# Patient Record
Sex: Female | Born: 1960
Health system: Southern US, Community
[De-identification: ages and names within clinical notes are randomized; demographics above are authoritative.]

## PROBLEM LIST (undated history)

## (undated) DIAGNOSIS — T7840XA Allergy, unspecified, initial encounter: Secondary | ICD-10-CM

## (undated) DIAGNOSIS — F89 Unspecified disorder of psychological development: Secondary | ICD-10-CM

## (undated) DIAGNOSIS — E669 Obesity, unspecified: Secondary | ICD-10-CM

## (undated) DIAGNOSIS — H269 Unspecified cataract: Secondary | ICD-10-CM

## (undated) DIAGNOSIS — G44229 Chronic tension-type headache, not intractable: Secondary | ICD-10-CM

## (undated) DIAGNOSIS — G43909 Migraine, unspecified, not intractable, without status migrainosus: Secondary | ICD-10-CM

## (undated) DIAGNOSIS — K219 Gastro-esophageal reflux disease without esophagitis: Secondary | ICD-10-CM

## (undated) DIAGNOSIS — E785 Hyperlipidemia, unspecified: Secondary | ICD-10-CM

## (undated) DIAGNOSIS — K5909 Other constipation: Secondary | ICD-10-CM

## (undated) DIAGNOSIS — F329 Major depressive disorder, single episode, unspecified: Secondary | ICD-10-CM

## (undated) DIAGNOSIS — F32A Depression, unspecified: Secondary | ICD-10-CM

## (undated) DIAGNOSIS — F79 Unspecified intellectual disabilities: Secondary | ICD-10-CM

## (undated) DIAGNOSIS — D369 Benign neoplasm, unspecified site: Secondary | ICD-10-CM

## (undated) DIAGNOSIS — R3129 Other microscopic hematuria: Secondary | ICD-10-CM

## (undated) DIAGNOSIS — N183 Chronic kidney disease, stage 3 unspecified: Secondary | ICD-10-CM

## (undated) DIAGNOSIS — I10 Essential (primary) hypertension: Secondary | ICD-10-CM

## (undated) DIAGNOSIS — F6810 Factitious disorder, unspecified: Secondary | ICD-10-CM

## (undated) DIAGNOSIS — F419 Anxiety disorder, unspecified: Secondary | ICD-10-CM

## (undated) HISTORY — DX: Hyperlipidemia, unspecified: E78.5

## (undated) HISTORY — DX: Chronic tension-type headache, not intractable: G44.229

## (undated) HISTORY — DX: Major depressive disorder, single episode, unspecified: F32.9

## (undated) HISTORY — DX: Chronic kidney disease, stage 3 (moderate): N18.3

## (undated) HISTORY — DX: Anxiety disorder, unspecified: F41.9

## (undated) HISTORY — DX: Depression, unspecified: F32.A

## (undated) HISTORY — DX: Gastro-esophageal reflux disease without esophagitis: K21.9

## (undated) HISTORY — DX: Unspecified intellectual disabilities: F79

## (undated) HISTORY — DX: Essential (primary) hypertension: I10

## (undated) HISTORY — DX: Other microscopic hematuria: R31.29

## (undated) HISTORY — DX: Allergy, unspecified, initial encounter: T78.40XA

## (undated) HISTORY — DX: Unspecified disorder of psychological development: F89

## (undated) HISTORY — PX: CARPAL TUNNEL RELEASE: SHX101

## (undated) HISTORY — DX: Migraine, unspecified, not intractable, without status migrainosus: G43.909

## (undated) HISTORY — DX: Chronic kidney disease, stage 3 unspecified: N18.30

## (undated) HISTORY — DX: Unspecified cataract: H26.9

## (undated) HISTORY — DX: Other constipation: K59.09

## (undated) HISTORY — DX: Factitious disorder imposed on self, unspecified: F68.10

## (undated) HISTORY — DX: Obesity, unspecified: E66.9

---

## 2004-11-02 ENCOUNTER — Emergency Department: Payer: Self-pay | Admitting: Emergency Medicine

## 2005-02-15 ENCOUNTER — Emergency Department: Payer: Self-pay | Admitting: Emergency Medicine

## 2005-07-06 ENCOUNTER — Emergency Department: Payer: Self-pay | Admitting: Emergency Medicine

## 2005-08-10 ENCOUNTER — Emergency Department: Payer: Self-pay | Admitting: Emergency Medicine

## 2005-08-18 ENCOUNTER — Emergency Department: Payer: Self-pay | Admitting: Emergency Medicine

## 2005-11-01 ENCOUNTER — Emergency Department: Payer: Self-pay | Admitting: Emergency Medicine

## 2006-03-28 ENCOUNTER — Emergency Department: Payer: Self-pay | Admitting: Emergency Medicine

## 2006-12-19 ENCOUNTER — Ambulatory Visit: Payer: Self-pay | Admitting: *Deleted

## 2007-02-23 ENCOUNTER — Emergency Department: Payer: Self-pay | Admitting: Emergency Medicine

## 2007-03-27 ENCOUNTER — Emergency Department: Payer: Self-pay | Admitting: Emergency Medicine

## 2007-05-04 ENCOUNTER — Emergency Department: Payer: Self-pay | Admitting: Emergency Medicine

## 2007-05-05 ENCOUNTER — Emergency Department: Payer: Self-pay | Admitting: Emergency Medicine

## 2007-05-13 ENCOUNTER — Emergency Department: Payer: Self-pay | Admitting: Emergency Medicine

## 2007-05-13 ENCOUNTER — Other Ambulatory Visit: Payer: Self-pay

## 2007-07-02 ENCOUNTER — Emergency Department: Payer: Self-pay | Admitting: Emergency Medicine

## 2007-07-16 ENCOUNTER — Emergency Department: Payer: Self-pay | Admitting: Emergency Medicine

## 2007-07-20 ENCOUNTER — Emergency Department: Payer: Self-pay | Admitting: Emergency Medicine

## 2007-07-23 ENCOUNTER — Emergency Department: Payer: Self-pay | Admitting: Emergency Medicine

## 2007-08-05 ENCOUNTER — Emergency Department: Payer: Self-pay | Admitting: Emergency Medicine

## 2007-08-05 ENCOUNTER — Other Ambulatory Visit: Payer: Self-pay

## 2007-10-15 ENCOUNTER — Inpatient Hospital Stay: Payer: Self-pay | Admitting: Internal Medicine

## 2007-10-15 ENCOUNTER — Other Ambulatory Visit: Payer: Self-pay

## 2007-10-19 ENCOUNTER — Emergency Department: Payer: Self-pay | Admitting: Emergency Medicine

## 2008-01-14 ENCOUNTER — Emergency Department: Payer: Self-pay | Admitting: Emergency Medicine

## 2008-01-28 ENCOUNTER — Emergency Department: Payer: Self-pay | Admitting: Emergency Medicine

## 2008-01-28 ENCOUNTER — Other Ambulatory Visit: Payer: Self-pay

## 2008-04-29 ENCOUNTER — Inpatient Hospital Stay: Payer: Self-pay | Admitting: Unknown Physician Specialty

## 2008-06-21 ENCOUNTER — Emergency Department: Payer: Self-pay | Admitting: Emergency Medicine

## 2010-06-01 ENCOUNTER — Emergency Department: Payer: Self-pay

## 2010-12-02 HISTORY — PX: US ECHOCARDIOGRAPHY: HXRAD669

## 2011-02-11 ENCOUNTER — Inpatient Hospital Stay: Payer: Self-pay | Admitting: Internal Medicine

## 2011-07-11 ENCOUNTER — Emergency Department: Payer: Self-pay | Admitting: Emergency Medicine

## 2011-09-06 ENCOUNTER — Emergency Department: Payer: Self-pay | Admitting: Emergency Medicine

## 2011-12-13 DIAGNOSIS — G43919 Migraine, unspecified, intractable, without status migrainosus: Secondary | ICD-10-CM | POA: Diagnosis not present

## 2011-12-13 DIAGNOSIS — Z0000 Encounter for general adult medical examination without abnormal findings: Secondary | ICD-10-CM | POA: Diagnosis not present

## 2011-12-13 DIAGNOSIS — E785 Hyperlipidemia, unspecified: Secondary | ICD-10-CM | POA: Diagnosis not present

## 2011-12-13 DIAGNOSIS — F329 Major depressive disorder, single episode, unspecified: Secondary | ICD-10-CM | POA: Diagnosis not present

## 2011-12-13 DIAGNOSIS — Z1331 Encounter for screening for depression: Secondary | ICD-10-CM | POA: Diagnosis not present

## 2012-02-04 ENCOUNTER — Ambulatory Visit: Payer: Self-pay

## 2012-02-04 DIAGNOSIS — Z1231 Encounter for screening mammogram for malignant neoplasm of breast: Secondary | ICD-10-CM | POA: Diagnosis not present

## 2012-02-07 DIAGNOSIS — IMO0002 Reserved for concepts with insufficient information to code with codable children: Secondary | ICD-10-CM | POA: Diagnosis not present

## 2012-02-12 DIAGNOSIS — F331 Major depressive disorder, recurrent, moderate: Secondary | ICD-10-CM | POA: Diagnosis not present

## 2012-02-12 DIAGNOSIS — F411 Generalized anxiety disorder: Secondary | ICD-10-CM | POA: Diagnosis not present

## 2012-02-12 DIAGNOSIS — F79 Unspecified intellectual disabilities: Secondary | ICD-10-CM | POA: Diagnosis not present

## 2012-02-17 ENCOUNTER — Ambulatory Visit: Payer: Self-pay | Admitting: Unknown Physician Specialty

## 2012-02-17 DIAGNOSIS — Z79899 Other long term (current) drug therapy: Secondary | ICD-10-CM | POA: Diagnosis not present

## 2012-02-17 DIAGNOSIS — K5900 Constipation, unspecified: Secondary | ICD-10-CM | POA: Diagnosis not present

## 2012-02-17 DIAGNOSIS — D126 Benign neoplasm of colon, unspecified: Secondary | ICD-10-CM | POA: Diagnosis not present

## 2012-02-17 DIAGNOSIS — D128 Benign neoplasm of rectum: Secondary | ICD-10-CM | POA: Diagnosis not present

## 2012-02-17 DIAGNOSIS — F70 Mild intellectual disabilities: Secondary | ICD-10-CM | POA: Diagnosis not present

## 2012-02-17 DIAGNOSIS — D129 Benign neoplasm of anus and anal canal: Secondary | ICD-10-CM | POA: Diagnosis not present

## 2012-02-17 DIAGNOSIS — Z9889 Other specified postprocedural states: Secondary | ICD-10-CM | POA: Diagnosis not present

## 2012-02-17 DIAGNOSIS — Z8371 Family history of colonic polyps: Secondary | ICD-10-CM | POA: Diagnosis not present

## 2012-02-17 DIAGNOSIS — E669 Obesity, unspecified: Secondary | ICD-10-CM | POA: Diagnosis not present

## 2012-02-17 DIAGNOSIS — Z836 Family history of other diseases of the respiratory system: Secondary | ICD-10-CM | POA: Diagnosis not present

## 2012-02-17 DIAGNOSIS — F329 Major depressive disorder, single episode, unspecified: Secondary | ICD-10-CM | POA: Diagnosis not present

## 2012-02-17 DIAGNOSIS — Z86010 Personal history of colonic polyps: Secondary | ICD-10-CM | POA: Diagnosis not present

## 2012-02-17 DIAGNOSIS — I10 Essential (primary) hypertension: Secondary | ICD-10-CM | POA: Diagnosis not present

## 2012-02-17 DIAGNOSIS — G43909 Migraine, unspecified, not intractable, without status migrainosus: Secondary | ICD-10-CM | POA: Diagnosis not present

## 2012-02-17 DIAGNOSIS — Z808 Family history of malignant neoplasm of other organs or systems: Secondary | ICD-10-CM | POA: Diagnosis not present

## 2012-02-17 DIAGNOSIS — E785 Hyperlipidemia, unspecified: Secondary | ICD-10-CM | POA: Diagnosis not present

## 2012-02-17 DIAGNOSIS — K648 Other hemorrhoids: Secondary | ICD-10-CM | POA: Diagnosis not present

## 2012-02-19 LAB — PATHOLOGY REPORT

## 2012-02-20 ENCOUNTER — Emergency Department: Payer: Self-pay | Admitting: Emergency Medicine

## 2012-02-20 DIAGNOSIS — T562X4A Toxic effect of chromium and its compounds, undetermined, initial encounter: Secondary | ICD-10-CM | POA: Diagnosis not present

## 2012-02-20 DIAGNOSIS — F79 Unspecified intellectual disabilities: Secondary | ICD-10-CM | POA: Diagnosis not present

## 2012-02-20 DIAGNOSIS — R6889 Other general symptoms and signs: Secondary | ICD-10-CM | POA: Diagnosis not present

## 2012-02-20 DIAGNOSIS — I10 Essential (primary) hypertension: Secondary | ICD-10-CM | POA: Diagnosis not present

## 2012-02-20 DIAGNOSIS — T50901A Poisoning by unspecified drugs, medicaments and biological substances, accidental (unintentional), initial encounter: Secondary | ICD-10-CM | POA: Diagnosis not present

## 2012-02-20 DIAGNOSIS — N189 Chronic kidney disease, unspecified: Secondary | ICD-10-CM | POA: Diagnosis not present

## 2012-02-20 LAB — URINALYSIS, COMPLETE
Blood: NEGATIVE
Nitrite: NEGATIVE
Protein: NEGATIVE
RBC,UR: 5 /HPF (ref 0–5)
Specific Gravity: 1.02 (ref 1.003–1.030)
WBC UR: 5 /HPF (ref 0–5)

## 2012-02-20 LAB — COMPREHENSIVE METABOLIC PANEL
Albumin: 4 g/dL (ref 3.4–5.0)
Anion Gap: 12 (ref 7–16)
BUN: 16 mg/dL (ref 7–18)
Bilirubin,Total: 0.6 mg/dL (ref 0.2–1.0)
Chloride: 107 mmol/L (ref 98–107)
Co2: 20 mmol/L — ABNORMAL LOW (ref 21–32)
Creatinine: 1.42 mg/dL — ABNORMAL HIGH (ref 0.60–1.30)
EGFR (African American): 50 — ABNORMAL LOW
Glucose: 104 mg/dL — ABNORMAL HIGH (ref 65–99)
Osmolality: 279 (ref 275–301)
Potassium: 3.3 mmol/L — ABNORMAL LOW (ref 3.5–5.1)
SGOT(AST): 34 U/L (ref 15–37)
Total Protein: 8.8 g/dL — ABNORMAL HIGH (ref 6.4–8.2)

## 2012-02-20 LAB — TSH: Thyroid Stimulating Horm: 1.94 u[IU]/mL

## 2012-02-20 LAB — DRUG SCREEN, URINE
Amphetamines, Ur Screen: NEGATIVE (ref ?–1000)
Benzodiazepine, Ur Scrn: NEGATIVE (ref ?–200)
Cannabinoid 50 Ng, Ur ~~LOC~~: NEGATIVE (ref ?–50)
MDMA (Ecstasy)Ur Screen: NEGATIVE (ref ?–500)
Opiate, Ur Screen: NEGATIVE (ref ?–300)
Tricyclic, Ur Screen: NEGATIVE (ref ?–1000)

## 2012-02-20 LAB — CBC
HCT: 45.9 % (ref 35.0–47.0)
RBC: 5.12 10*6/uL (ref 3.80–5.20)
RDW: 13.1 % (ref 11.5–14.5)

## 2012-02-20 LAB — ETHANOL: Ethanol %: <0.003 % (ref 0.000–0.080)

## 2012-02-20 LAB — SALICYLATE LEVEL: Salicylates, Serum: 2.3 mg/dL

## 2012-02-26 ENCOUNTER — Inpatient Hospital Stay: Payer: Self-pay | Admitting: Psychiatry

## 2012-02-26 DIAGNOSIS — F329 Major depressive disorder, single episode, unspecified: Secondary | ICD-10-CM | POA: Diagnosis not present

## 2012-02-26 DIAGNOSIS — F79 Unspecified intellectual disabilities: Secondary | ICD-10-CM | POA: Diagnosis not present

## 2012-02-26 DIAGNOSIS — M1990 Unspecified osteoarthritis, unspecified site: Secondary | ICD-10-CM | POA: Diagnosis present

## 2012-02-26 DIAGNOSIS — F3289 Other specified depressive episodes: Secondary | ICD-10-CM | POA: Diagnosis not present

## 2012-02-26 DIAGNOSIS — F259 Schizoaffective disorder, unspecified: Secondary | ICD-10-CM | POA: Diagnosis not present

## 2012-02-26 DIAGNOSIS — F331 Major depressive disorder, recurrent, moderate: Secondary | ICD-10-CM | POA: Diagnosis not present

## 2012-02-26 DIAGNOSIS — R45851 Suicidal ideations: Secondary | ICD-10-CM | POA: Diagnosis not present

## 2012-02-26 DIAGNOSIS — F333 Major depressive disorder, recurrent, severe with psychotic symptoms: Secondary | ICD-10-CM | POA: Diagnosis not present

## 2012-02-26 DIAGNOSIS — F411 Generalized anxiety disorder: Secondary | ICD-10-CM | POA: Diagnosis not present

## 2012-02-26 DIAGNOSIS — E7800 Pure hypercholesterolemia, unspecified: Secondary | ICD-10-CM | POA: Diagnosis not present

## 2012-02-26 LAB — URINALYSIS, COMPLETE
Blood: NEGATIVE
Glucose,UR: NEGATIVE mg/dL (ref 0–75)
Ketone: NEGATIVE
Nitrite: NEGATIVE
Squamous Epithelial: 1

## 2012-02-26 LAB — DRUG SCREEN, URINE
Amphetamines, Ur Screen: NEGATIVE (ref ?–1000)
Barbiturates, Ur Screen: NEGATIVE (ref ?–200)
Benzodiazepine, Ur Scrn: NEGATIVE (ref ?–200)
Cannabinoid 50 Ng, Ur ~~LOC~~: NEGATIVE (ref ?–50)
Cocaine Metabolite,Ur ~~LOC~~: NEGATIVE (ref ?–300)
MDMA (Ecstasy)Ur Screen: NEGATIVE (ref ?–500)
Phencyclidine (PCP) Ur S: NEGATIVE (ref ?–25)
Tricyclic, Ur Screen: NEGATIVE (ref ?–1000)

## 2012-02-26 LAB — SALICYLATE LEVEL: Salicylates, Serum: <1.7 mg/dL

## 2012-02-26 LAB — COMPREHENSIVE METABOLIC PANEL
Albumin: 3.7 g/dL (ref 3.4–5.0)
Alkaline Phosphatase: 110 U/L (ref 50–136)
Bilirubin,Total: 0.3 mg/dL (ref 0.2–1.0)
Calcium, Total: 9.4 mg/dL (ref 8.5–10.1)
Osmolality: 284 (ref 275–301)
SGOT(AST): 17 U/L (ref 15–37)

## 2012-02-26 LAB — CBC
HCT: 42.3 % (ref 35.0–47.0)
HGB: 14 g/dL (ref 12.0–16.0)
MCH: 30 pg (ref 26.0–34.0)
WBC: 8.8 10*3/uL (ref 3.6–11.0)

## 2012-02-26 LAB — ETHANOL: Ethanol %: <0.003 % (ref 0.000–0.080)

## 2012-02-27 LAB — BEHAVIORAL MEDICINE 1 PANEL
Albumin: 3.3 g/dL — ABNORMAL LOW (ref 3.4–5.0)
Anion Gap: 9 (ref 7–16)
Basophil #: 0 10*3/uL (ref 0.0–0.1)
Basophil %: 0.5 %
Bilirubin,Total: 0.4 mg/dL (ref 0.2–1.0)
Calcium, Total: 9.1 mg/dL (ref 8.5–10.1)
Co2: 26 mmol/L (ref 21–32)
Creatinine: 1.36 mg/dL — ABNORMAL HIGH (ref 0.60–1.30)
EGFR (African American): 53 — ABNORMAL LOW
Eosinophil #: 0.3 10*3/uL (ref 0.0–0.7)
Glucose: 87 mg/dL (ref 65–99)
HCT: 39.7 % (ref 35.0–47.0)
HGB: 13.1 g/dL (ref 12.0–16.0)
Lymphocyte %: 31.9 %
MCV: 90 fL (ref 80–100)
Monocyte #: 0.5 10*3/uL (ref 0.0–0.7)
Monocyte %: 6.5 %
Potassium: 3.9 mmol/L (ref 3.5–5.1)
RBC: 4.42 10*6/uL (ref 3.80–5.20)
RDW: 13.1 % (ref 11.5–14.5)
Sodium: 140 mmol/L (ref 136–145)
Thyroid Stimulating Horm: 1.86 u[IU]/mL
Total Protein: 7.2 g/dL (ref 6.4–8.2)

## 2012-03-16 DIAGNOSIS — F331 Major depressive disorder, recurrent, moderate: Secondary | ICD-10-CM | POA: Diagnosis not present

## 2012-03-16 DIAGNOSIS — F79 Unspecified intellectual disabilities: Secondary | ICD-10-CM | POA: Diagnosis not present

## 2012-04-08 DIAGNOSIS — F79 Unspecified intellectual disabilities: Secondary | ICD-10-CM | POA: Diagnosis not present

## 2012-04-10 DIAGNOSIS — R5383 Other fatigue: Secondary | ICD-10-CM | POA: Diagnosis not present

## 2012-04-10 DIAGNOSIS — R6889 Other general symptoms and signs: Secondary | ICD-10-CM | POA: Diagnosis not present

## 2012-04-10 DIAGNOSIS — G43919 Migraine, unspecified, intractable, without status migrainosus: Secondary | ICD-10-CM | POA: Diagnosis not present

## 2012-04-10 DIAGNOSIS — N390 Urinary tract infection, site not specified: Secondary | ICD-10-CM | POA: Diagnosis not present

## 2012-04-10 DIAGNOSIS — R5381 Other malaise: Secondary | ICD-10-CM | POA: Diagnosis not present

## 2012-04-14 DIAGNOSIS — F411 Generalized anxiety disorder: Secondary | ICD-10-CM | POA: Diagnosis not present

## 2012-05-01 DIAGNOSIS — F411 Generalized anxiety disorder: Secondary | ICD-10-CM | POA: Diagnosis not present

## 2012-05-01 DIAGNOSIS — F259 Schizoaffective disorder, unspecified: Secondary | ICD-10-CM | POA: Diagnosis not present

## 2012-05-01 DIAGNOSIS — F79 Unspecified intellectual disabilities: Secondary | ICD-10-CM | POA: Diagnosis not present

## 2012-05-11 DIAGNOSIS — F329 Major depressive disorder, single episode, unspecified: Secondary | ICD-10-CM | POA: Diagnosis not present

## 2012-05-11 DIAGNOSIS — G43919 Migraine, unspecified, intractable, without status migrainosus: Secondary | ICD-10-CM | POA: Diagnosis not present

## 2012-05-11 DIAGNOSIS — N183 Chronic kidney disease, stage 3 unspecified: Secondary | ICD-10-CM | POA: Diagnosis not present

## 2012-05-11 DIAGNOSIS — IMO0002 Reserved for concepts with insufficient information to code with codable children: Secondary | ICD-10-CM | POA: Diagnosis not present

## 2012-05-11 DIAGNOSIS — E7800 Pure hypercholesterolemia, unspecified: Secondary | ICD-10-CM | POA: Diagnosis not present

## 2012-05-15 DIAGNOSIS — F411 Generalized anxiety disorder: Secondary | ICD-10-CM | POA: Diagnosis not present

## 2012-05-15 DIAGNOSIS — F79 Unspecified intellectual disabilities: Secondary | ICD-10-CM | POA: Diagnosis not present

## 2012-05-15 DIAGNOSIS — F259 Schizoaffective disorder, unspecified: Secondary | ICD-10-CM | POA: Diagnosis not present

## 2012-06-01 DIAGNOSIS — F259 Schizoaffective disorder, unspecified: Secondary | ICD-10-CM | POA: Diagnosis not present

## 2012-06-01 DIAGNOSIS — F411 Generalized anxiety disorder: Secondary | ICD-10-CM | POA: Diagnosis not present

## 2012-06-01 DIAGNOSIS — F79 Unspecified intellectual disabilities: Secondary | ICD-10-CM | POA: Diagnosis not present

## 2012-06-12 DIAGNOSIS — F411 Generalized anxiety disorder: Secondary | ICD-10-CM | POA: Diagnosis not present

## 2012-06-12 DIAGNOSIS — F259 Schizoaffective disorder, unspecified: Secondary | ICD-10-CM | POA: Diagnosis not present

## 2012-06-12 DIAGNOSIS — F79 Unspecified intellectual disabilities: Secondary | ICD-10-CM | POA: Diagnosis not present

## 2012-07-17 DIAGNOSIS — F259 Schizoaffective disorder, unspecified: Secondary | ICD-10-CM | POA: Diagnosis not present

## 2012-07-17 DIAGNOSIS — F411 Generalized anxiety disorder: Secondary | ICD-10-CM | POA: Diagnosis not present

## 2012-07-17 DIAGNOSIS — F79 Unspecified intellectual disabilities: Secondary | ICD-10-CM | POA: Diagnosis not present

## 2012-07-27 DIAGNOSIS — F79 Unspecified intellectual disabilities: Secondary | ICD-10-CM | POA: Diagnosis not present

## 2012-07-27 DIAGNOSIS — F411 Generalized anxiety disorder: Secondary | ICD-10-CM | POA: Diagnosis not present

## 2012-08-10 DIAGNOSIS — F329 Major depressive disorder, single episode, unspecified: Secondary | ICD-10-CM | POA: Diagnosis not present

## 2012-08-10 DIAGNOSIS — F259 Schizoaffective disorder, unspecified: Secondary | ICD-10-CM | POA: Diagnosis not present

## 2012-08-10 DIAGNOSIS — G44229 Chronic tension-type headache, not intractable: Secondary | ICD-10-CM | POA: Diagnosis not present

## 2012-08-10 DIAGNOSIS — F411 Generalized anxiety disorder: Secondary | ICD-10-CM | POA: Diagnosis not present

## 2012-08-10 DIAGNOSIS — F79 Unspecified intellectual disabilities: Secondary | ICD-10-CM | POA: Diagnosis not present

## 2012-08-13 DIAGNOSIS — G43909 Migraine, unspecified, not intractable, without status migrainosus: Secondary | ICD-10-CM | POA: Diagnosis not present

## 2012-08-13 DIAGNOSIS — R51 Headache: Secondary | ICD-10-CM | POA: Diagnosis not present

## 2012-08-13 DIAGNOSIS — M542 Cervicalgia: Secondary | ICD-10-CM | POA: Diagnosis not present

## 2012-08-13 DIAGNOSIS — F329 Major depressive disorder, single episode, unspecified: Secondary | ICD-10-CM | POA: Diagnosis not present

## 2012-08-13 DIAGNOSIS — F79 Unspecified intellectual disabilities: Secondary | ICD-10-CM | POA: Diagnosis not present

## 2012-08-17 DIAGNOSIS — F79 Unspecified intellectual disabilities: Secondary | ICD-10-CM | POA: Diagnosis not present

## 2012-08-17 DIAGNOSIS — F411 Generalized anxiety disorder: Secondary | ICD-10-CM | POA: Diagnosis not present

## 2012-08-17 DIAGNOSIS — F259 Schizoaffective disorder, unspecified: Secondary | ICD-10-CM | POA: Diagnosis not present

## 2012-08-25 DIAGNOSIS — M9981 Other biomechanical lesions of cervical region: Secondary | ICD-10-CM | POA: Diagnosis not present

## 2012-08-25 DIAGNOSIS — M542 Cervicalgia: Secondary | ICD-10-CM | POA: Diagnosis not present

## 2012-08-25 DIAGNOSIS — G44209 Tension-type headache, unspecified, not intractable: Secondary | ICD-10-CM | POA: Diagnosis not present

## 2012-08-26 DIAGNOSIS — M542 Cervicalgia: Secondary | ICD-10-CM | POA: Diagnosis not present

## 2012-08-26 DIAGNOSIS — M9981 Other biomechanical lesions of cervical region: Secondary | ICD-10-CM | POA: Diagnosis not present

## 2012-08-26 DIAGNOSIS — G44209 Tension-type headache, unspecified, not intractable: Secondary | ICD-10-CM | POA: Diagnosis not present

## 2012-08-27 DIAGNOSIS — M542 Cervicalgia: Secondary | ICD-10-CM | POA: Diagnosis not present

## 2012-08-27 DIAGNOSIS — G44209 Tension-type headache, unspecified, not intractable: Secondary | ICD-10-CM | POA: Diagnosis not present

## 2012-08-27 DIAGNOSIS — M9981 Other biomechanical lesions of cervical region: Secondary | ICD-10-CM | POA: Diagnosis not present

## 2012-08-28 DIAGNOSIS — M9981 Other biomechanical lesions of cervical region: Secondary | ICD-10-CM | POA: Diagnosis not present

## 2012-08-28 DIAGNOSIS — G44209 Tension-type headache, unspecified, not intractable: Secondary | ICD-10-CM | POA: Diagnosis not present

## 2012-08-28 DIAGNOSIS — M542 Cervicalgia: Secondary | ICD-10-CM | POA: Diagnosis not present

## 2012-08-29 DIAGNOSIS — M9981 Other biomechanical lesions of cervical region: Secondary | ICD-10-CM | POA: Diagnosis not present

## 2012-08-29 DIAGNOSIS — M542 Cervicalgia: Secondary | ICD-10-CM | POA: Diagnosis not present

## 2012-08-29 DIAGNOSIS — G44209 Tension-type headache, unspecified, not intractable: Secondary | ICD-10-CM | POA: Diagnosis not present

## 2012-09-11 DIAGNOSIS — F259 Schizoaffective disorder, unspecified: Secondary | ICD-10-CM | POA: Diagnosis not present

## 2012-09-11 DIAGNOSIS — F79 Unspecified intellectual disabilities: Secondary | ICD-10-CM | POA: Diagnosis not present

## 2012-09-24 DIAGNOSIS — Z23 Encounter for immunization: Secondary | ICD-10-CM | POA: Diagnosis not present

## 2012-09-29 ENCOUNTER — Inpatient Hospital Stay: Payer: Self-pay | Admitting: Psychiatry

## 2012-09-29 DIAGNOSIS — F79 Unspecified intellectual disabilities: Secondary | ICD-10-CM | POA: Diagnosis not present

## 2012-09-29 DIAGNOSIS — F411 Generalized anxiety disorder: Secondary | ICD-10-CM | POA: Diagnosis not present

## 2012-09-29 DIAGNOSIS — F09 Unspecified mental disorder due to known physiological condition: Secondary | ICD-10-CM | POA: Diagnosis not present

## 2012-09-29 DIAGNOSIS — N390 Urinary tract infection, site not specified: Secondary | ICD-10-CM | POA: Diagnosis present

## 2012-09-29 DIAGNOSIS — E785 Hyperlipidemia, unspecified: Secondary | ICD-10-CM | POA: Diagnosis present

## 2012-09-29 DIAGNOSIS — R45851 Suicidal ideations: Secondary | ICD-10-CM | POA: Diagnosis not present

## 2012-09-29 DIAGNOSIS — F329 Major depressive disorder, single episode, unspecified: Secondary | ICD-10-CM | POA: Diagnosis present

## 2012-09-29 DIAGNOSIS — F71 Moderate intellectual disabilities: Secondary | ICD-10-CM | POA: Diagnosis present

## 2012-09-29 DIAGNOSIS — F331 Major depressive disorder, recurrent, moderate: Secondary | ICD-10-CM | POA: Diagnosis not present

## 2012-09-29 LAB — URINALYSIS, COMPLETE
Glucose,UR: NEGATIVE mg/dL (ref 0–75)
Ketone: NEGATIVE
Nitrite: NEGATIVE
Ph: 5 (ref 4.5–8.0)
Protein: NEGATIVE
Specific Gravity: 1.03 (ref 1.003–1.030)
Squamous Epithelial: 9
WBC UR: 17 /HPF (ref 0–5)

## 2012-09-30 LAB — BEHAVIORAL MEDICINE 1 PANEL
Alkaline Phosphatase: 104 U/L (ref 50–136)
Basophil #: 0.1 10*3/uL (ref 0.0–0.1)
Basophil %: 0.8 %
Bilirubin,Total: 0.4 mg/dL (ref 0.2–1.0)
Calcium, Total: 9.5 mg/dL (ref 8.5–10.1)
Chloride: 106 mmol/L (ref 98–107)
Co2: 25 mmol/L (ref 21–32)
EGFR (African American): 52 — ABNORMAL LOW
EGFR (Non-African Amer.): 45 — ABNORMAL LOW
Eosinophil #: 0.2 10*3/uL (ref 0.0–0.7)
HCT: 41.9 % (ref 35.0–47.0)
HGB: 14.5 g/dL (ref 12.0–16.0)
Lymphocyte #: 3.5 10*3/uL (ref 1.0–3.6)
MCH: 30.5 pg (ref 26.0–34.0)
MCHC: 34.5 g/dL (ref 32.0–36.0)
Monocyte #: 0.5 x10 3/mm (ref 0.2–0.9)
Monocyte %: 6.5 %
Neutrophil #: 3.7 10*3/uL (ref 1.4–6.5)
Osmolality: 282 (ref 275–301)
Platelet: 248 10*3/uL (ref 150–440)
RDW: 13.6 % (ref 11.5–14.5)
SGPT (ALT): 27 U/L (ref 12–78)
Total Protein: 8.1 g/dL (ref 6.4–8.2)
WBC: 7.9 10*3/uL (ref 3.6–11.0)

## 2012-09-30 LAB — LIPID PANEL
HDL Cholesterol: 61 mg/dL — ABNORMAL HIGH (ref 40–60)
VLDL Cholesterol, Calc: 27 mg/dL (ref 5–40)

## 2012-10-06 DIAGNOSIS — F79 Unspecified intellectual disabilities: Secondary | ICD-10-CM | POA: Diagnosis not present

## 2012-10-06 DIAGNOSIS — F331 Major depressive disorder, recurrent, moderate: Secondary | ICD-10-CM | POA: Diagnosis not present

## 2012-10-26 DIAGNOSIS — F259 Schizoaffective disorder, unspecified: Secondary | ICD-10-CM | POA: Diagnosis not present

## 2012-11-09 DIAGNOSIS — G43909 Migraine, unspecified, not intractable, without status migrainosus: Secondary | ICD-10-CM | POA: Diagnosis not present

## 2012-11-09 DIAGNOSIS — F70 Mild intellectual disabilities: Secondary | ICD-10-CM | POA: Diagnosis not present

## 2012-11-13 DIAGNOSIS — F259 Schizoaffective disorder, unspecified: Secondary | ICD-10-CM | POA: Diagnosis not present

## 2012-11-13 DIAGNOSIS — F79 Unspecified intellectual disabilities: Secondary | ICD-10-CM | POA: Diagnosis not present

## 2012-11-18 DIAGNOSIS — F79 Unspecified intellectual disabilities: Secondary | ICD-10-CM | POA: Diagnosis not present

## 2012-11-18 DIAGNOSIS — F259 Schizoaffective disorder, unspecified: Secondary | ICD-10-CM | POA: Diagnosis not present

## 2012-12-11 DIAGNOSIS — M546 Pain in thoracic spine: Secondary | ICD-10-CM | POA: Diagnosis not present

## 2012-12-11 DIAGNOSIS — M999 Biomechanical lesion, unspecified: Secondary | ICD-10-CM | POA: Diagnosis not present

## 2012-12-11 DIAGNOSIS — M9981 Other biomechanical lesions of cervical region: Secondary | ICD-10-CM | POA: Diagnosis not present

## 2012-12-11 DIAGNOSIS — G44209 Tension-type headache, unspecified, not intractable: Secondary | ICD-10-CM | POA: Diagnosis not present

## 2012-12-14 DIAGNOSIS — M9981 Other biomechanical lesions of cervical region: Secondary | ICD-10-CM | POA: Diagnosis not present

## 2012-12-14 DIAGNOSIS — M546 Pain in thoracic spine: Secondary | ICD-10-CM | POA: Diagnosis not present

## 2012-12-14 DIAGNOSIS — M999 Biomechanical lesion, unspecified: Secondary | ICD-10-CM | POA: Diagnosis not present

## 2012-12-14 DIAGNOSIS — G44209 Tension-type headache, unspecified, not intractable: Secondary | ICD-10-CM | POA: Diagnosis not present

## 2012-12-16 DIAGNOSIS — M9981 Other biomechanical lesions of cervical region: Secondary | ICD-10-CM | POA: Diagnosis not present

## 2012-12-16 DIAGNOSIS — M999 Biomechanical lesion, unspecified: Secondary | ICD-10-CM | POA: Diagnosis not present

## 2012-12-16 DIAGNOSIS — M546 Pain in thoracic spine: Secondary | ICD-10-CM | POA: Diagnosis not present

## 2012-12-16 DIAGNOSIS — G44209 Tension-type headache, unspecified, not intractable: Secondary | ICD-10-CM | POA: Diagnosis not present

## 2012-12-18 DIAGNOSIS — M999 Biomechanical lesion, unspecified: Secondary | ICD-10-CM | POA: Diagnosis not present

## 2012-12-18 DIAGNOSIS — G44209 Tension-type headache, unspecified, not intractable: Secondary | ICD-10-CM | POA: Diagnosis not present

## 2012-12-18 DIAGNOSIS — M546 Pain in thoracic spine: Secondary | ICD-10-CM | POA: Diagnosis not present

## 2012-12-18 DIAGNOSIS — M9981 Other biomechanical lesions of cervical region: Secondary | ICD-10-CM | POA: Diagnosis not present

## 2012-12-21 DIAGNOSIS — M546 Pain in thoracic spine: Secondary | ICD-10-CM | POA: Diagnosis not present

## 2012-12-21 DIAGNOSIS — M999 Biomechanical lesion, unspecified: Secondary | ICD-10-CM | POA: Diagnosis not present

## 2012-12-21 DIAGNOSIS — M9981 Other biomechanical lesions of cervical region: Secondary | ICD-10-CM | POA: Diagnosis not present

## 2012-12-21 DIAGNOSIS — G44209 Tension-type headache, unspecified, not intractable: Secondary | ICD-10-CM | POA: Diagnosis not present

## 2012-12-23 DIAGNOSIS — M546 Pain in thoracic spine: Secondary | ICD-10-CM | POA: Diagnosis not present

## 2012-12-23 DIAGNOSIS — M999 Biomechanical lesion, unspecified: Secondary | ICD-10-CM | POA: Diagnosis not present

## 2012-12-23 DIAGNOSIS — M9981 Other biomechanical lesions of cervical region: Secondary | ICD-10-CM | POA: Diagnosis not present

## 2012-12-23 DIAGNOSIS — G44209 Tension-type headache, unspecified, not intractable: Secondary | ICD-10-CM | POA: Diagnosis not present

## 2012-12-25 DIAGNOSIS — M9981 Other biomechanical lesions of cervical region: Secondary | ICD-10-CM | POA: Diagnosis not present

## 2012-12-25 DIAGNOSIS — M999 Biomechanical lesion, unspecified: Secondary | ICD-10-CM | POA: Diagnosis not present

## 2012-12-25 DIAGNOSIS — M546 Pain in thoracic spine: Secondary | ICD-10-CM | POA: Diagnosis not present

## 2012-12-25 DIAGNOSIS — G44209 Tension-type headache, unspecified, not intractable: Secondary | ICD-10-CM | POA: Diagnosis not present

## 2013-01-01 DIAGNOSIS — M999 Biomechanical lesion, unspecified: Secondary | ICD-10-CM | POA: Diagnosis not present

## 2013-01-01 DIAGNOSIS — M9981 Other biomechanical lesions of cervical region: Secondary | ICD-10-CM | POA: Diagnosis not present

## 2013-01-01 DIAGNOSIS — M546 Pain in thoracic spine: Secondary | ICD-10-CM | POA: Diagnosis not present

## 2013-01-01 DIAGNOSIS — G44209 Tension-type headache, unspecified, not intractable: Secondary | ICD-10-CM | POA: Diagnosis not present

## 2013-01-05 DIAGNOSIS — G44209 Tension-type headache, unspecified, not intractable: Secondary | ICD-10-CM | POA: Diagnosis not present

## 2013-01-05 DIAGNOSIS — M9981 Other biomechanical lesions of cervical region: Secondary | ICD-10-CM | POA: Diagnosis not present

## 2013-01-05 DIAGNOSIS — M546 Pain in thoracic spine: Secondary | ICD-10-CM | POA: Diagnosis not present

## 2013-01-05 DIAGNOSIS — M999 Biomechanical lesion, unspecified: Secondary | ICD-10-CM | POA: Diagnosis not present

## 2013-01-08 DIAGNOSIS — G44209 Tension-type headache, unspecified, not intractable: Secondary | ICD-10-CM | POA: Diagnosis not present

## 2013-01-08 DIAGNOSIS — M999 Biomechanical lesion, unspecified: Secondary | ICD-10-CM | POA: Diagnosis not present

## 2013-01-08 DIAGNOSIS — M546 Pain in thoracic spine: Secondary | ICD-10-CM | POA: Diagnosis not present

## 2013-01-08 DIAGNOSIS — M9981 Other biomechanical lesions of cervical region: Secondary | ICD-10-CM | POA: Diagnosis not present

## 2013-01-12 DIAGNOSIS — M9981 Other biomechanical lesions of cervical region: Secondary | ICD-10-CM | POA: Diagnosis not present

## 2013-01-12 DIAGNOSIS — G44209 Tension-type headache, unspecified, not intractable: Secondary | ICD-10-CM | POA: Diagnosis not present

## 2013-01-12 DIAGNOSIS — M999 Biomechanical lesion, unspecified: Secondary | ICD-10-CM | POA: Diagnosis not present

## 2013-01-12 DIAGNOSIS — M546 Pain in thoracic spine: Secondary | ICD-10-CM | POA: Diagnosis not present

## 2013-01-19 DIAGNOSIS — M999 Biomechanical lesion, unspecified: Secondary | ICD-10-CM | POA: Diagnosis not present

## 2013-01-19 DIAGNOSIS — M9981 Other biomechanical lesions of cervical region: Secondary | ICD-10-CM | POA: Diagnosis not present

## 2013-01-19 DIAGNOSIS — M546 Pain in thoracic spine: Secondary | ICD-10-CM | POA: Diagnosis not present

## 2013-01-19 DIAGNOSIS — G44209 Tension-type headache, unspecified, not intractable: Secondary | ICD-10-CM | POA: Diagnosis not present

## 2013-01-26 DIAGNOSIS — M546 Pain in thoracic spine: Secondary | ICD-10-CM | POA: Diagnosis not present

## 2013-01-26 DIAGNOSIS — M999 Biomechanical lesion, unspecified: Secondary | ICD-10-CM | POA: Diagnosis not present

## 2013-02-02 DIAGNOSIS — M999 Biomechanical lesion, unspecified: Secondary | ICD-10-CM | POA: Diagnosis not present

## 2013-02-02 DIAGNOSIS — M9981 Other biomechanical lesions of cervical region: Secondary | ICD-10-CM | POA: Diagnosis not present

## 2013-03-16 DIAGNOSIS — F411 Generalized anxiety disorder: Secondary | ICD-10-CM | POA: Diagnosis not present

## 2013-03-16 DIAGNOSIS — F79 Unspecified intellectual disabilities: Secondary | ICD-10-CM | POA: Diagnosis not present

## 2013-03-16 DIAGNOSIS — F330 Major depressive disorder, recurrent, mild: Secondary | ICD-10-CM | POA: Diagnosis not present

## 2013-04-15 ENCOUNTER — Emergency Department: Payer: Self-pay | Admitting: Emergency Medicine

## 2013-04-15 DIAGNOSIS — F332 Major depressive disorder, recurrent severe without psychotic features: Secondary | ICD-10-CM | POA: Diagnosis not present

## 2013-04-15 DIAGNOSIS — I10 Essential (primary) hypertension: Secondary | ICD-10-CM | POA: Diagnosis not present

## 2013-04-15 DIAGNOSIS — R0602 Shortness of breath: Secondary | ICD-10-CM | POA: Diagnosis not present

## 2013-04-15 DIAGNOSIS — Z79899 Other long term (current) drug therapy: Secondary | ICD-10-CM | POA: Diagnosis not present

## 2013-04-15 DIAGNOSIS — R51 Headache: Secondary | ICD-10-CM | POA: Diagnosis not present

## 2013-04-15 DIAGNOSIS — F0280 Dementia in other diseases classified elsewhere without behavioral disturbance: Secondary | ICD-10-CM | POA: Diagnosis not present

## 2013-04-15 DIAGNOSIS — F329 Major depressive disorder, single episode, unspecified: Secondary | ICD-10-CM | POA: Diagnosis not present

## 2013-04-15 DIAGNOSIS — G43909 Migraine, unspecified, not intractable, without status migrainosus: Secondary | ICD-10-CM | POA: Diagnosis not present

## 2013-04-15 DIAGNOSIS — F79 Unspecified intellectual disabilities: Secondary | ICD-10-CM | POA: Diagnosis not present

## 2013-04-15 LAB — COMPREHENSIVE METABOLIC PANEL
Alkaline Phosphatase: 129 U/L (ref 50–136)
Anion Gap: 13 (ref 7–16)
BUN: 15 mg/dL (ref 7–18)
Bilirubin,Total: 0.2 mg/dL (ref 0.2–1.0)
Co2: 20 mmol/L — ABNORMAL LOW (ref 21–32)
Creatinine: 1.34 mg/dL — ABNORMAL HIGH (ref 0.60–1.30)
EGFR (African American): 53 — ABNORMAL LOW
EGFR (Non-African Amer.): 46 — ABNORMAL LOW
Potassium: 3 mmol/L — ABNORMAL LOW (ref 3.5–5.1)
SGPT (ALT): 33 U/L (ref 12–78)
Sodium: 140 mmol/L (ref 136–145)
Total Protein: 8.5 g/dL — ABNORMAL HIGH (ref 6.4–8.2)

## 2013-04-15 LAB — SALICYLATE LEVEL: Salicylates, Serum: <1.7 mg/dL

## 2013-04-15 LAB — DRUG SCREEN, URINE
Amphetamines, Ur Screen: NEGATIVE (ref ?–1000)
Cannabinoid 50 Ng, Ur ~~LOC~~: NEGATIVE (ref ?–50)
MDMA (Ecstasy)Ur Screen: NEGATIVE (ref ?–500)
Methadone, Ur Screen: NEGATIVE (ref ?–300)
Opiate, Ur Screen: NEGATIVE (ref ?–300)
Phencyclidine (PCP) Ur S: NEGATIVE (ref ?–25)
Tricyclic, Ur Screen: NEGATIVE (ref ?–1000)

## 2013-04-15 LAB — CBC
HCT: 40 % (ref 35.0–47.0)
MCV: 88 fL (ref 80–100)
Platelet: 254 10*3/uL (ref 150–440)
RBC: 4.56 10*6/uL (ref 3.80–5.20)
RDW: 13.7 % (ref 11.5–14.5)

## 2013-04-15 LAB — ETHANOL: Ethanol: <3 mg/dL

## 2013-04-16 DIAGNOSIS — F0280 Dementia in other diseases classified elsewhere without behavioral disturbance: Secondary | ICD-10-CM | POA: Diagnosis not present

## 2013-04-17 DIAGNOSIS — R51 Headache: Secondary | ICD-10-CM | POA: Diagnosis not present

## 2013-04-17 DIAGNOSIS — R0602 Shortness of breath: Secondary | ICD-10-CM | POA: Diagnosis not present

## 2013-04-17 LAB — TROPONIN I: Troponin-I: <0.02 ng/mL

## 2013-04-17 LAB — CBC WITH DIFFERENTIAL/PLATELET
Basophil %: 0.2 %
Eosinophil #: 0 10*3/uL (ref 0.0–0.7)
Eosinophil %: 0 %
HCT: 37.8 % (ref 35.0–47.0)
Lymphocyte #: 2 10*3/uL (ref 1.0–3.6)
MCHC: 33.7 g/dL (ref 32.0–36.0)
MCV: 88 fL (ref 80–100)
Monocyte #: 1.1 x10 3/mm — ABNORMAL HIGH (ref 0.2–0.9)
Neutrophil #: 18 10*3/uL — ABNORMAL HIGH (ref 1.4–6.5)
Neutrophil %: 84.9 %
RDW: 13.8 % (ref 11.5–14.5)
WBC: 21.3 10*3/uL — ABNORMAL HIGH (ref 3.6–11.0)

## 2013-04-17 LAB — COMPREHENSIVE METABOLIC PANEL
Alkaline Phosphatase: 99 U/L (ref 50–136)
Anion Gap: 12 (ref 7–16)
BUN: 27 mg/dL — ABNORMAL HIGH (ref 7–18)
Calcium, Total: 8.9 mg/dL (ref 8.5–10.1)
Creatinine: 1.57 mg/dL — ABNORMAL HIGH (ref 0.60–1.30)
EGFR (African American): 44 — ABNORMAL LOW
EGFR (Non-African Amer.): 38 — ABNORMAL LOW
Glucose: 111 mg/dL — ABNORMAL HIGH (ref 65–99)
Osmolality: 285 (ref 275–301)
SGOT(AST): 52 U/L — ABNORMAL HIGH (ref 15–37)
SGPT (ALT): 34 U/L (ref 12–78)
Total Protein: 7.5 g/dL (ref 6.4–8.2)

## 2013-04-17 LAB — URINALYSIS, COMPLETE
Bacteria: NONE SEEN
Bilirubin,UR: NEGATIVE
Blood: NEGATIVE
Leukocyte Esterase: NEGATIVE
Nitrite: NEGATIVE
Ph: 7 (ref 4.5–8.0)
Protein: 30
RBC,UR: 2 /HPF (ref 0–5)
Squamous Epithelial: 1

## 2013-04-17 LAB — LIPASE, BLOOD: Lipase: 78 U/L (ref 73–393)

## 2013-04-18 LAB — COMPREHENSIVE METABOLIC PANEL
Anion Gap: 9 (ref 7–16)
Bilirubin,Total: 0.4 mg/dL (ref 0.2–1.0)
Calcium, Total: 8.5 mg/dL (ref 8.5–10.1)
Co2: 21 mmol/L (ref 21–32)
Creatinine: 1.32 mg/dL — ABNORMAL HIGH (ref 0.60–1.30)
EGFR (African American): 54 — ABNORMAL LOW
EGFR (Non-African Amer.): 47 — ABNORMAL LOW
Glucose: 102 mg/dL — ABNORMAL HIGH (ref 65–99)
Osmolality: 283 (ref 275–301)

## 2013-04-18 LAB — CBC WITH DIFFERENTIAL/PLATELET
Basophil #: 0.1 10*3/uL (ref 0.0–0.1)
Eosinophil #: 0.1 10*3/uL (ref 0.0–0.7)
Eosinophil %: 0.4 %
HCT: 34 % — ABNORMAL LOW (ref 35.0–47.0)
HGB: 11.6 g/dL — ABNORMAL LOW (ref 12.0–16.0)
Lymphocyte #: 2.5 10*3/uL (ref 1.0–3.6)
Lymphocyte %: 16.9 %
MCH: 30.1 pg (ref 26.0–34.0)
MCHC: 34.1 g/dL (ref 32.0–36.0)
Neutrophil #: 11.3 10*3/uL — ABNORMAL HIGH (ref 1.4–6.5)
Neutrophil %: 76.8 %
Platelet: 230 10*3/uL (ref 150–440)
RBC: 3.86 10*6/uL (ref 3.80–5.20)
RDW: 14.2 % (ref 11.5–14.5)
WBC: 14.7 10*3/uL — ABNORMAL HIGH (ref 3.6–11.0)

## 2013-04-19 DIAGNOSIS — F259 Schizoaffective disorder, unspecified: Secondary | ICD-10-CM | POA: Diagnosis not present

## 2013-04-21 DIAGNOSIS — R110 Nausea: Secondary | ICD-10-CM | POA: Diagnosis not present

## 2013-04-21 DIAGNOSIS — J029 Acute pharyngitis, unspecified: Secondary | ICD-10-CM | POA: Diagnosis not present

## 2013-04-27 DIAGNOSIS — F79 Unspecified intellectual disabilities: Secondary | ICD-10-CM | POA: Diagnosis not present

## 2013-05-20 DIAGNOSIS — F331 Major depressive disorder, recurrent, moderate: Secondary | ICD-10-CM | POA: Diagnosis not present

## 2013-05-20 DIAGNOSIS — F411 Generalized anxiety disorder: Secondary | ICD-10-CM | POA: Diagnosis not present

## 2013-05-20 DIAGNOSIS — F79 Unspecified intellectual disabilities: Secondary | ICD-10-CM | POA: Diagnosis not present

## 2013-06-09 DIAGNOSIS — F331 Major depressive disorder, recurrent, moderate: Secondary | ICD-10-CM | POA: Diagnosis not present

## 2013-06-09 DIAGNOSIS — F411 Generalized anxiety disorder: Secondary | ICD-10-CM | POA: Diagnosis not present

## 2013-06-09 DIAGNOSIS — F79 Unspecified intellectual disabilities: Secondary | ICD-10-CM | POA: Diagnosis not present

## 2013-06-30 DIAGNOSIS — F331 Major depressive disorder, recurrent, moderate: Secondary | ICD-10-CM | POA: Diagnosis not present

## 2013-06-30 DIAGNOSIS — F411 Generalized anxiety disorder: Secondary | ICD-10-CM | POA: Diagnosis not present

## 2013-06-30 DIAGNOSIS — F79 Unspecified intellectual disabilities: Secondary | ICD-10-CM | POA: Diagnosis not present

## 2013-07-21 DIAGNOSIS — F331 Major depressive disorder, recurrent, moderate: Secondary | ICD-10-CM | POA: Diagnosis not present

## 2013-07-21 DIAGNOSIS — F79 Unspecified intellectual disabilities: Secondary | ICD-10-CM | POA: Diagnosis not present

## 2013-08-12 DIAGNOSIS — F79 Unspecified intellectual disabilities: Secondary | ICD-10-CM | POA: Diagnosis not present

## 2013-08-12 DIAGNOSIS — F411 Generalized anxiety disorder: Secondary | ICD-10-CM | POA: Diagnosis not present

## 2013-08-12 DIAGNOSIS — IMO0002 Reserved for concepts with insufficient information to code with codable children: Secondary | ICD-10-CM | POA: Diagnosis not present

## 2013-09-01 DIAGNOSIS — F79 Unspecified intellectual disabilities: Secondary | ICD-10-CM | POA: Diagnosis not present

## 2013-09-10 DIAGNOSIS — Z23 Encounter for immunization: Secondary | ICD-10-CM | POA: Diagnosis not present

## 2013-09-21 DIAGNOSIS — F331 Major depressive disorder, recurrent, moderate: Secondary | ICD-10-CM | POA: Diagnosis not present

## 2013-10-06 DIAGNOSIS — N951 Menopausal and female climacteric states: Secondary | ICD-10-CM | POA: Diagnosis not present

## 2013-10-06 DIAGNOSIS — N912 Amenorrhea, unspecified: Secondary | ICD-10-CM | POA: Diagnosis not present

## 2013-10-11 DIAGNOSIS — F79 Unspecified intellectual disabilities: Secondary | ICD-10-CM | POA: Diagnosis not present

## 2013-12-27 ENCOUNTER — Observation Stay: Payer: Self-pay | Admitting: Internal Medicine

## 2013-12-27 DIAGNOSIS — E785 Hyperlipidemia, unspecified: Secondary | ICD-10-CM | POA: Diagnosis not present

## 2013-12-27 DIAGNOSIS — G43909 Migraine, unspecified, not intractable, without status migrainosus: Secondary | ICD-10-CM | POA: Diagnosis not present

## 2013-12-27 DIAGNOSIS — F79 Unspecified intellectual disabilities: Secondary | ICD-10-CM | POA: Diagnosis not present

## 2013-12-27 DIAGNOSIS — A088 Other specified intestinal infections: Secondary | ICD-10-CM | POA: Diagnosis not present

## 2013-12-27 DIAGNOSIS — R079 Chest pain, unspecified: Secondary | ICD-10-CM | POA: Diagnosis not present

## 2013-12-27 DIAGNOSIS — R51 Headache: Secondary | ICD-10-CM | POA: Diagnosis not present

## 2013-12-27 DIAGNOSIS — R197 Diarrhea, unspecified: Secondary | ICD-10-CM | POA: Diagnosis not present

## 2013-12-27 DIAGNOSIS — Z79899 Other long term (current) drug therapy: Secondary | ICD-10-CM | POA: Diagnosis not present

## 2013-12-27 DIAGNOSIS — N183 Chronic kidney disease, stage 3 unspecified: Secondary | ICD-10-CM | POA: Diagnosis not present

## 2013-12-27 DIAGNOSIS — R509 Fever, unspecified: Secondary | ICD-10-CM | POA: Diagnosis not present

## 2013-12-27 DIAGNOSIS — R61 Generalized hyperhidrosis: Secondary | ICD-10-CM | POA: Diagnosis not present

## 2013-12-27 DIAGNOSIS — K5289 Other specified noninfective gastroenteritis and colitis: Secondary | ICD-10-CM | POA: Diagnosis not present

## 2013-12-27 DIAGNOSIS — E876 Hypokalemia: Secondary | ICD-10-CM | POA: Diagnosis not present

## 2013-12-27 DIAGNOSIS — E860 Dehydration: Secondary | ICD-10-CM | POA: Diagnosis not present

## 2013-12-27 DIAGNOSIS — R635 Abnormal weight gain: Secondary | ICD-10-CM | POA: Diagnosis not present

## 2013-12-27 DIAGNOSIS — N179 Acute kidney failure, unspecified: Secondary | ICD-10-CM | POA: Diagnosis not present

## 2013-12-27 DIAGNOSIS — F411 Generalized anxiety disorder: Secondary | ICD-10-CM | POA: Diagnosis not present

## 2013-12-27 DIAGNOSIS — R112 Nausea with vomiting, unspecified: Secondary | ICD-10-CM | POA: Diagnosis not present

## 2013-12-27 DIAGNOSIS — N189 Chronic kidney disease, unspecified: Secondary | ICD-10-CM | POA: Diagnosis not present

## 2013-12-27 DIAGNOSIS — F329 Major depressive disorder, single episode, unspecified: Secondary | ICD-10-CM | POA: Diagnosis not present

## 2013-12-27 DIAGNOSIS — R918 Other nonspecific abnormal finding of lung field: Secondary | ICD-10-CM | POA: Diagnosis not present

## 2013-12-27 DIAGNOSIS — F3289 Other specified depressive episodes: Secondary | ICD-10-CM | POA: Diagnosis not present

## 2013-12-27 DIAGNOSIS — R109 Unspecified abdominal pain: Secondary | ICD-10-CM | POA: Diagnosis not present

## 2013-12-27 DIAGNOSIS — I10 Essential (primary) hypertension: Secondary | ICD-10-CM | POA: Diagnosis not present

## 2013-12-27 DIAGNOSIS — Z6838 Body mass index (BMI) 38.0-38.9, adult: Secondary | ICD-10-CM | POA: Diagnosis not present

## 2013-12-27 LAB — CBC WITH DIFFERENTIAL/PLATELET
Basophil #: 0 10*3/uL (ref 0.0–0.1)
Basophil %: 0.1 %
EOS ABS: 0 10*3/uL (ref 0.0–0.7)
Eosinophil %: 0.4 %
HCT: 44 % (ref 35.0–47.0)
HGB: 14.4 g/dL (ref 12.0–16.0)
LYMPHS ABS: 0.9 10*3/uL — AB (ref 1.0–3.6)
LYMPHS PCT: 10.3 %
MCH: 29.3 pg (ref 26.0–34.0)
MCHC: 32.8 g/dL (ref 32.0–36.0)
MCV: 90 fL (ref 80–100)
Monocyte #: 0.5 x10 3/mm (ref 0.2–0.9)
Monocyte %: 6.3 %
NEUTROS ABS: 7 10*3/uL — AB (ref 1.4–6.5)
NEUTROS PCT: 82.9 %
Platelet: 233 10*3/uL (ref 150–440)
RBC: 4.91 10*6/uL (ref 3.80–5.20)
RDW: 14.4 % (ref 11.5–14.5)
WBC: 8.5 10*3/uL (ref 3.6–11.0)

## 2013-12-27 LAB — URINALYSIS, COMPLETE
BILIRUBIN, UR: NEGATIVE
Blood: NEGATIVE
GLUCOSE, UR: NEGATIVE mg/dL (ref 0–75)
Ketone: NEGATIVE
Nitrite: NEGATIVE
PH: 5 (ref 4.5–8.0)
Protein: 30
RBC,UR: 6 /HPF (ref 0–5)
Specific Gravity: 1.035 (ref 1.003–1.030)

## 2013-12-27 LAB — COMPREHENSIVE METABOLIC PANEL
ALK PHOS: 102 U/L
ANION GAP: 7 (ref 7–16)
AST: 15 U/L (ref 15–37)
Albumin: 3.6 g/dL (ref 3.4–5.0)
BUN: 18 mg/dL (ref 7–18)
Bilirubin,Total: 0.4 mg/dL (ref 0.2–1.0)
CO2: 22 mmol/L (ref 21–32)
Calcium, Total: 9 mg/dL (ref 8.5–10.1)
Chloride: 110 mmol/L — ABNORMAL HIGH (ref 98–107)
Creatinine: 1.6 mg/dL — ABNORMAL HIGH (ref 0.60–1.30)
EGFR (Non-African Amer.): 37 — ABNORMAL LOW
GFR CALC AF AMER: 42 — AB
Glucose: 99 mg/dL (ref 65–99)
OSMOLALITY: 279 (ref 275–301)
Potassium: 3.4 mmol/L — ABNORMAL LOW (ref 3.5–5.1)
SGPT (ALT): 25 U/L (ref 12–78)
SODIUM: 139 mmol/L (ref 136–145)
Total Protein: 8.2 g/dL (ref 6.4–8.2)

## 2013-12-27 LAB — OCCULT BLOOD X 1 CARD TO LAB, STOOL: Occult Blood, Feces: NEGATIVE

## 2013-12-27 LAB — TROPONIN I
Troponin-I: <0.02 ng/mL
Troponin-I: <0.02 ng/mL

## 2013-12-27 LAB — CLOSTRIDIUM DIFFICILE(ARMC)

## 2013-12-28 DIAGNOSIS — N183 Chronic kidney disease, stage 3 unspecified: Secondary | ICD-10-CM | POA: Diagnosis not present

## 2013-12-28 DIAGNOSIS — E876 Hypokalemia: Secondary | ICD-10-CM | POA: Diagnosis not present

## 2013-12-28 DIAGNOSIS — K5289 Other specified noninfective gastroenteritis and colitis: Secondary | ICD-10-CM | POA: Diagnosis not present

## 2013-12-28 DIAGNOSIS — E860 Dehydration: Secondary | ICD-10-CM | POA: Diagnosis not present

## 2013-12-28 LAB — BASIC METABOLIC PANEL
Anion Gap: 7 (ref 7–16)
BUN: 10 mg/dL (ref 7–18)
CREATININE: 1.36 mg/dL — AB (ref 0.60–1.30)
Calcium, Total: 8.6 mg/dL (ref 8.5–10.1)
Chloride: 109 mmol/L — ABNORMAL HIGH (ref 98–107)
Co2: 24 mmol/L (ref 21–32)
EGFR (Non-African Amer.): 45 — ABNORMAL LOW
GFR CALC AF AMER: 52 — AB
Glucose: 87 mg/dL (ref 65–99)
OSMOLALITY: 278 (ref 275–301)
POTASSIUM: 3.8 mmol/L (ref 3.5–5.1)
Sodium: 140 mmol/L (ref 136–145)

## 2013-12-28 LAB — CBC WITH DIFFERENTIAL/PLATELET
Basophil #: 0 10*3/uL (ref 0.0–0.1)
Basophil %: 0.3 %
EOS PCT: 3 %
Eosinophil #: 0.2 10*3/uL (ref 0.0–0.7)
HCT: 38.8 % (ref 35.0–47.0)
HGB: 12.6 g/dL (ref 12.0–16.0)
Lymphocyte #: 1.6 10*3/uL (ref 1.0–3.6)
Lymphocyte %: 28.2 %
MCH: 29.3 pg (ref 26.0–34.0)
MCHC: 32.6 g/dL (ref 32.0–36.0)
MCV: 90 fL (ref 80–100)
MONOS PCT: 10.4 %
Monocyte #: 0.6 x10 3/mm (ref 0.2–0.9)
NEUTROS PCT: 58.1 %
Neutrophil #: 3.3 10*3/uL (ref 1.4–6.5)
Platelet: 180 10*3/uL (ref 150–440)
RBC: 4.31 10*6/uL (ref 3.80–5.20)
RDW: 14.5 % (ref 11.5–14.5)
WBC: 5.7 10*3/uL (ref 3.6–11.0)

## 2013-12-28 LAB — WBCS, STOOL

## 2013-12-28 LAB — MAGNESIUM: MAGNESIUM: 1.7 mg/dL — AB

## 2013-12-29 LAB — URINE CULTURE

## 2013-12-30 LAB — STOOL CULTURE

## 2014-01-10 DIAGNOSIS — G43919 Migraine, unspecified, intractable, without status migrainosus: Secondary | ICD-10-CM | POA: Diagnosis not present

## 2014-01-10 DIAGNOSIS — R197 Diarrhea, unspecified: Secondary | ICD-10-CM | POA: Diagnosis not present

## 2014-01-10 DIAGNOSIS — E669 Obesity, unspecified: Secondary | ICD-10-CM | POA: Diagnosis not present

## 2014-01-10 DIAGNOSIS — G43909 Migraine, unspecified, not intractable, without status migrainosus: Secondary | ICD-10-CM | POA: Diagnosis not present

## 2014-02-08 DIAGNOSIS — E669 Obesity, unspecified: Secondary | ICD-10-CM | POA: Diagnosis not present

## 2014-02-08 DIAGNOSIS — Z0000 Encounter for general adult medical examination without abnormal findings: Secondary | ICD-10-CM | POA: Diagnosis not present

## 2014-02-08 DIAGNOSIS — G43919 Migraine, unspecified, intractable, without status migrainosus: Secondary | ICD-10-CM | POA: Diagnosis not present

## 2014-02-08 DIAGNOSIS — E785 Hyperlipidemia, unspecified: Secondary | ICD-10-CM | POA: Diagnosis not present

## 2014-02-09 DIAGNOSIS — F79 Unspecified intellectual disabilities: Secondary | ICD-10-CM | POA: Diagnosis not present

## 2014-02-21 DIAGNOSIS — IMO0002 Reserved for concepts with insufficient information to code with codable children: Secondary | ICD-10-CM | POA: Diagnosis not present

## 2014-03-02 LAB — HM MAMMOGRAPHY

## 2014-03-17 DIAGNOSIS — R51 Headache: Secondary | ICD-10-CM | POA: Diagnosis not present

## 2014-03-22 DIAGNOSIS — F79 Unspecified intellectual disabilities: Secondary | ICD-10-CM | POA: Diagnosis not present

## 2014-04-01 IMAGING — CT CT HEAD WITHOUT CONTRAST
2 series · 15 of 30 positions shown, 19 images · non-contrast
Comparison: none

REASON FOR EXAM: fall  pain back opf head
COMMENTS:

[Series 2: soft tissue · axial · 0.40mm/px · z∈[+124,+244]mm · 13 of 30 slices shown, 17 images (1 of 2)]
[im 3/30  brain]
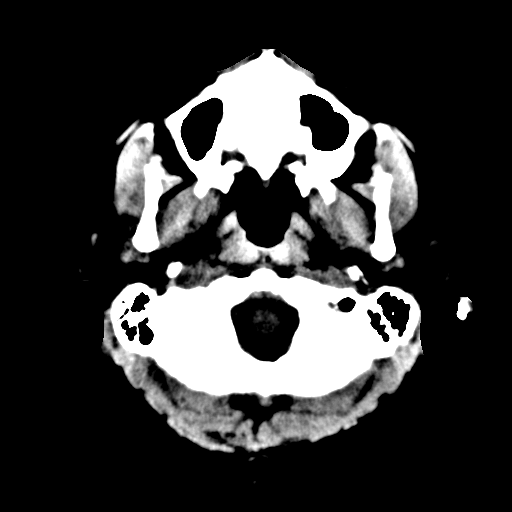
[im 3/30  bone]
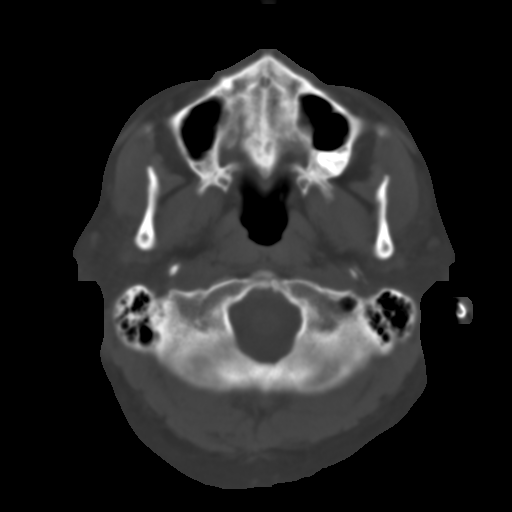
[im 5/30  brain]
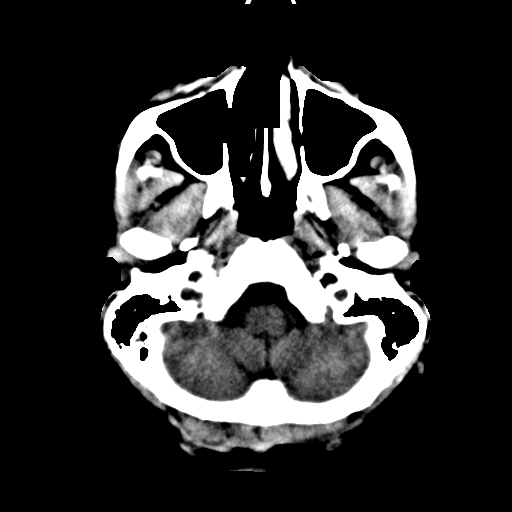
[im 7/30  brain]
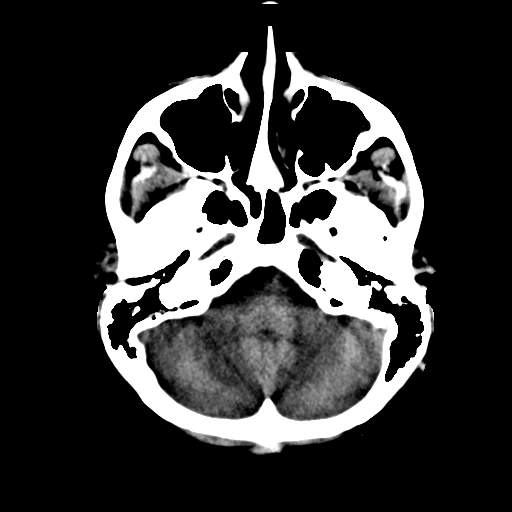
[im 9/30  brain]
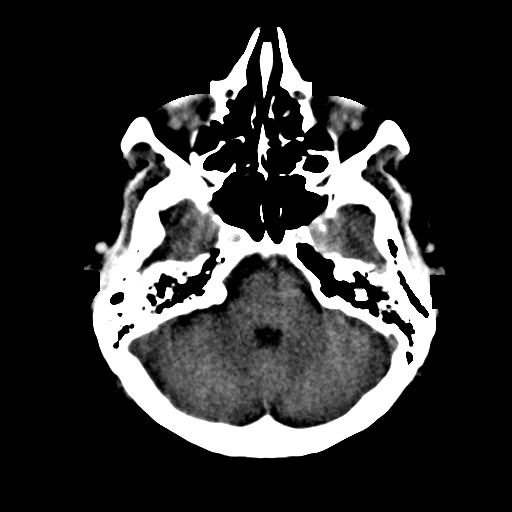
[im 11/30  brain]
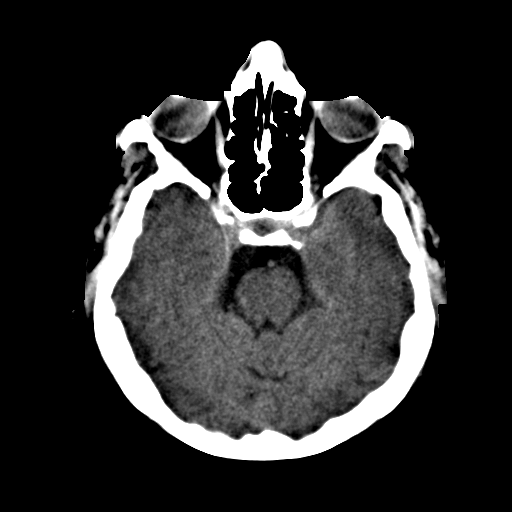
[im 11/30  bone]
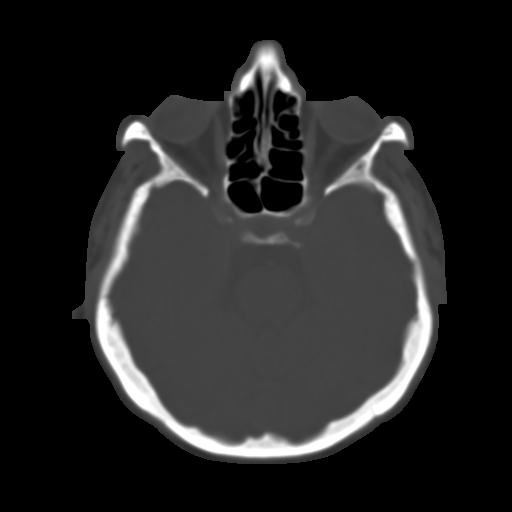
[im 13/30  brain]
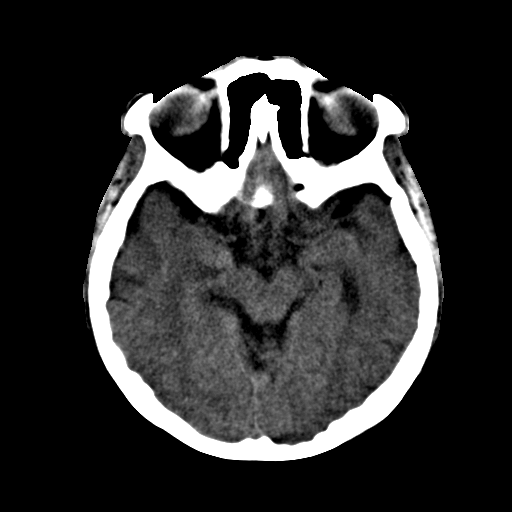
[im 15/30  brain]
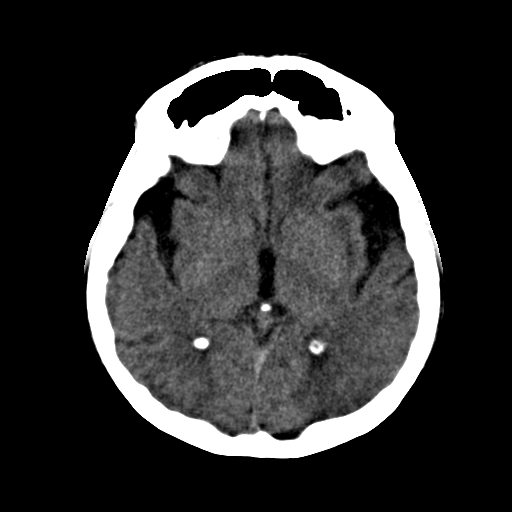
[im 17/30  brain]
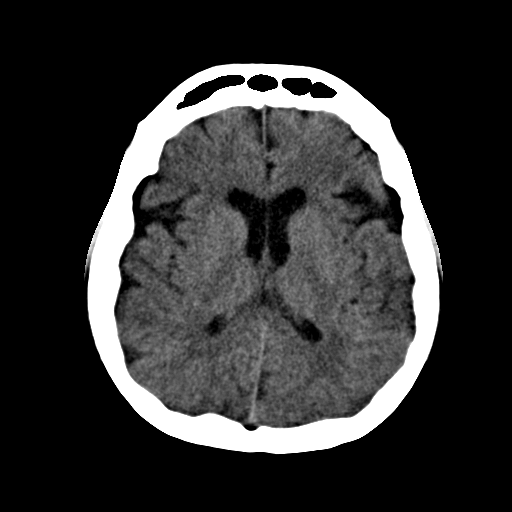
[im 19/30  brain]
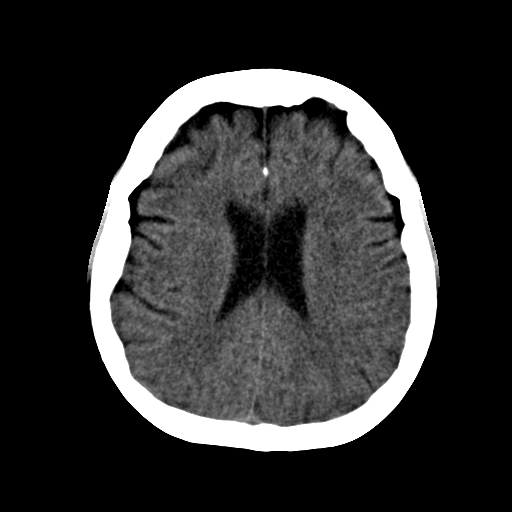
[im 19/30  bone]
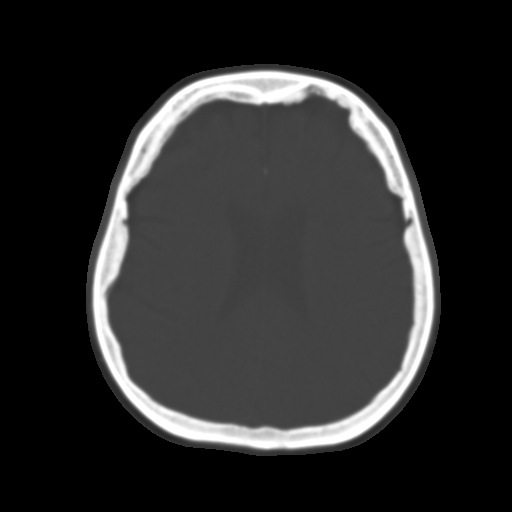
[im 21/30  brain]
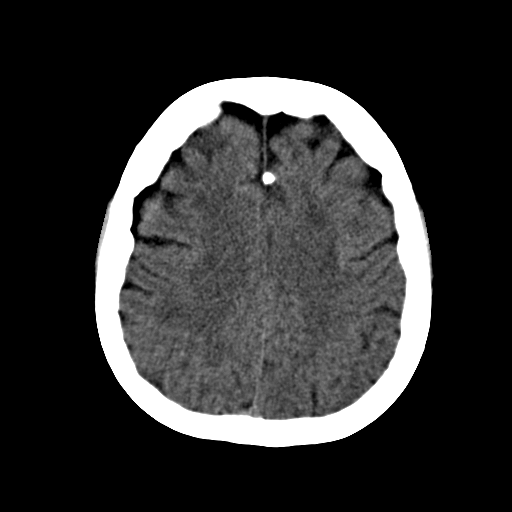
[im 23/30  brain]
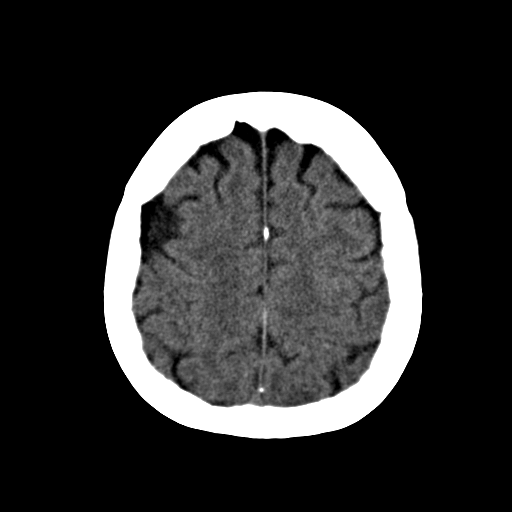
[im 25/30  brain]
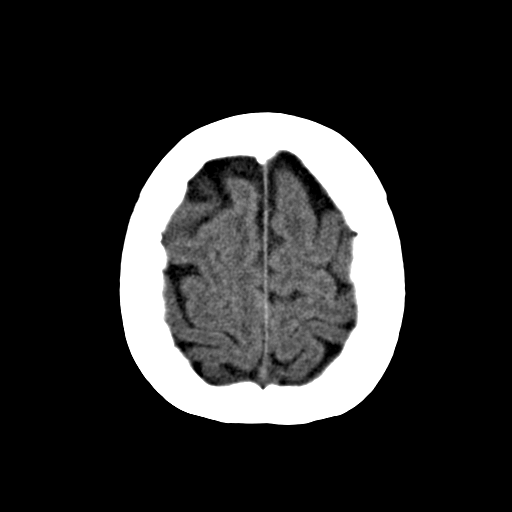
[im 27/30  brain]
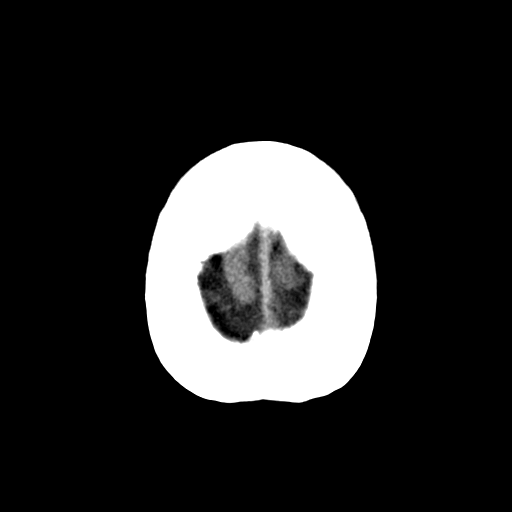
[im 27/30  bone]
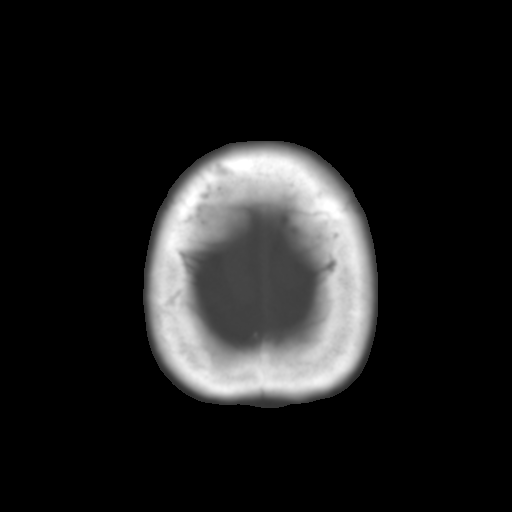

[Series 4: soft tissue · axial · 0.40mm/px · z∈[+186,+204]mm · 2 of 31 slices shown (2 of 2)]
[im 3/31  brain]
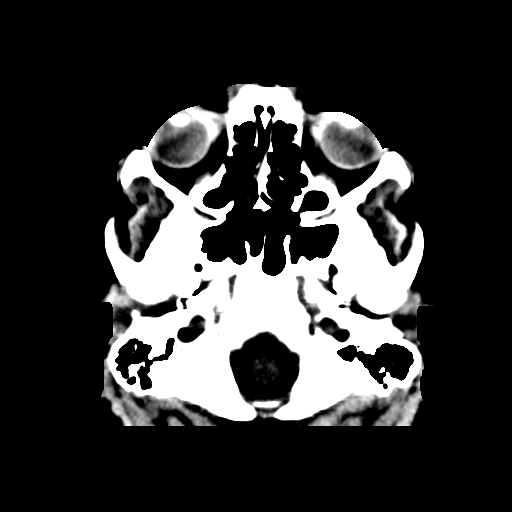
[im 7/31  brain]
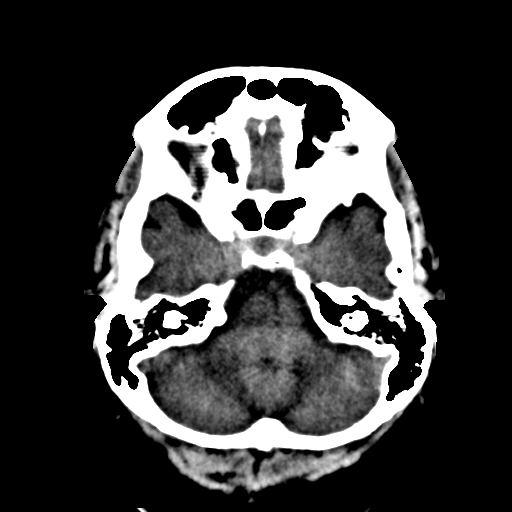

[15 of 30 positions shown; findings below may reference images not displayed]

PROCEDURE:     CT  - CT HEAD WITHOUT CONTRAST  - April 18, 2013  [DATE]

RESULT:     Axial noncontrast CT scanning was performed through the brain
with reconstructions at 5 mm intervals and slice thicknesses. Comparison is
made to the study April 29, 2008.

There is very mild diffuse cerebral atrophy which is stable. The ventricles
are normal in size and position. There is no intracranial hemorrhage nor
intracranial mass effect. The cerebellum and brainstem are normal in
appearance. There is no evidence of an evolving ischemic infarction.

At bone window settings there is no evidence of an acute skull fracture. The
observed portions of the paranasal sinuses and mastoid air cells are clear.
IMPRESSION: 1. There is no evidence of an acute intracranial hemorrhage nor other acute
abnormality of the brain.
2. There is very mild cerebral atrophy diffusely.

[REDACTED]

## 2014-04-12 ENCOUNTER — Ambulatory Visit: Payer: Self-pay

## 2014-04-12 DIAGNOSIS — Z1231 Encounter for screening mammogram for malignant neoplasm of breast: Secondary | ICD-10-CM | POA: Diagnosis not present

## 2014-05-03 DIAGNOSIS — G4452 New daily persistent headache (NDPH): Secondary | ICD-10-CM | POA: Diagnosis not present

## 2014-05-17 DIAGNOSIS — F79 Unspecified intellectual disabilities: Secondary | ICD-10-CM | POA: Diagnosis not present

## 2014-06-24 DIAGNOSIS — R51 Headache: Secondary | ICD-10-CM

## 2014-06-24 DIAGNOSIS — R519 Headache, unspecified: Secondary | ICD-10-CM | POA: Insufficient documentation

## 2014-07-05 DIAGNOSIS — F331 Major depressive disorder, recurrent, moderate: Secondary | ICD-10-CM | POA: Diagnosis not present

## 2014-07-05 DIAGNOSIS — F79 Unspecified intellectual disabilities: Secondary | ICD-10-CM | POA: Diagnosis not present

## 2014-07-25 DIAGNOSIS — N183 Chronic kidney disease, stage 3 unspecified: Secondary | ICD-10-CM | POA: Diagnosis not present

## 2014-07-25 DIAGNOSIS — E785 Hyperlipidemia, unspecified: Secondary | ICD-10-CM | POA: Diagnosis not present

## 2014-08-26 DIAGNOSIS — Z23 Encounter for immunization: Secondary | ICD-10-CM | POA: Diagnosis not present

## 2014-10-25 DIAGNOSIS — R51 Headache: Secondary | ICD-10-CM | POA: Diagnosis not present

## 2014-10-25 DIAGNOSIS — F79 Unspecified intellectual disabilities: Secondary | ICD-10-CM | POA: Diagnosis not present

## 2015-01-09 DIAGNOSIS — E669 Obesity, unspecified: Secondary | ICD-10-CM | POA: Diagnosis not present

## 2015-01-09 DIAGNOSIS — K219 Gastro-esophageal reflux disease without esophagitis: Secondary | ICD-10-CM | POA: Diagnosis not present

## 2015-01-09 DIAGNOSIS — N183 Chronic kidney disease, stage 3 (moderate): Secondary | ICD-10-CM | POA: Diagnosis not present

## 2015-01-09 DIAGNOSIS — I10 Essential (primary) hypertension: Secondary | ICD-10-CM | POA: Diagnosis not present

## 2015-01-09 DIAGNOSIS — E619 Deficiency of nutrient element, unspecified: Secondary | ICD-10-CM | POA: Diagnosis not present

## 2015-01-09 DIAGNOSIS — E785 Hyperlipidemia, unspecified: Secondary | ICD-10-CM | POA: Diagnosis not present

## 2015-02-06 ENCOUNTER — Other Ambulatory Visit: Payer: Self-pay | Admitting: Psychiatry

## 2015-02-06 DIAGNOSIS — F064 Anxiety disorder due to known physiological condition: Secondary | ICD-10-CM | POA: Diagnosis not present

## 2015-02-06 DIAGNOSIS — F79 Unspecified intellectual disabilities: Secondary | ICD-10-CM | POA: Diagnosis not present

## 2015-02-06 DIAGNOSIS — F258 Other schizoaffective disorders: Secondary | ICD-10-CM | POA: Diagnosis not present

## 2015-03-21 NOTE — Discharge Summary (Signed)
PATIENT NAME:  Rita [name], FARKAS MR#:  [id number] DATE OF BIRTH:  [birth year]  DATE OF ADMISSION:  09/29/2012 DATE OF DISCHARGE:  10/01/2012  HOSPITAL COURSE: See dictated History and Physical for details of admission. This 54 year old woman with a history of recurrent severe depression and mental retardation was admitted to the hospital voluntarily. She had come to my office for an outpatient appointment and reported to me that she was feeling more depressed, having suicidal thoughts, thinking about overdosing, having auditory hallucinations. She had reported similar symptoms to her therapist whom she had seen earlier in the afternoon. Mother confirmed that the patient had been more depressed recently, although she was not aware that the patient had been having hallucinations. There did not seem to be a clear-cut precipitant for her decline in mood. Medication had not been recently changed. Mother has chronic concerns about the use of medications in this patient, although she can confirm that the patient has done better with her depression and psychotic symptoms in the past when she has been on medicine. Because of the patient's history of intentional overdoses on  medicine and the significance of her symptoms, it was recommended that she be admitted to the hospital and the patient agreed. She was voluntarily admitted to the psychiatry ward. After reflection on the history and current presentation, I suggested that we try changing her medicine just in the form of stopping the Abilify and switching back to Seroquel 100 mg at night. The patient had been taking this medicine in the past and had appeared to be more stable, sleeping well, probably fewer mood symptoms when she was taking it. The patient was agreeable to that. In the hospital she has not engaged in any dangerous, aggressive, or self-injuring behavior. She has appeared to be pretty much at her baseline mental state. She is not agitated. She is not tearful.   She has not been threatening.  On repeat interview she now says that her mood is feeling better. She denies any suicidal thoughts and denies any auditory hallucinations. She has not acted as though she were responding to internal stimuli. Based on my understanding of this patient whom I have only known for a short time along with evaluating previous records, it appears that she may at times exaggerate symptoms or use symptom reports to get a specific treatment that she wants. In this case I am not sure if that is the case. She did seem to want to be in the hospital but as soon as she got here she wanted to be home. I think that she may just be going through a period of increased anxiety in general and not really knowing what she wanted, but she was clear that she did not like being in the hospital. At this point she is appearing to be pretty much at her baseline mental state. Does not appear to be acutely dangerous. I have spoken to her mother and the mother is agreeable with our plan to discharge the patient back home. We will continue her on her current medication, which is primarily just a change to 100 mg of Seroquel from the Abilify, but continuing her Cymbalta at 90 mg a day. I have also continued her on Zocor, although fortunately her lipid profile that we did here in the hospital is actually quite good.   The patient also has a urinalysis that is consistent with a likely urinary tract infection.  I am going to go ahead and put her on medication to  treat that as well.   DISPOSITION: Discharge home with her family. She will see me and see Miguel Dibble, the therapist in our clinic, for follow-up treatment. We can arrange that within the next week or two. I am also available by phone as is [place].   LABORATORY RESULTS: Urinalysis showed 2+ leukocyte esterase, 17 white cells, 9 red cells, 3+ bacteria, probably consistent with infection. CBC entirely normal. Chemistry panel shows a slightly elevated  creatinine at 1.36. AST low at 13. Lipid panel showed a cholesterol of 189, triglycerides 136, HDL high at 61, LDL slightly elevated at 101.   DISCHARGE MEDICATIONS:  1. Cymbalta 30 mg at night and 60 mg in the day.  2. Seroquel 100 mg at night.  3. Zocor 20 mg at night.  4. Septra 1 tablet twice a day for seven days, double strength.   MENTAL STATUS EXAM: Casually dressed, reasonably well-groomed woman. Passively cooperative with the exam. Eye contact is decreased to intermittent, which is normal for her. Psychomotor activity generally slow. Speech is slow and decreased in total amount, which is normal for her. Affect is flat, with some smiling. No tearfulness. No agitation. Mood is stated as being fine. Thoughts are slow, very simplistic, very concrete, typical of a person who has what is probably moderate mental retardation. Denies auditory or visual hallucinations. Does not appear paranoid. Did not make any delusional statements. She is alert and oriented to the place, situation, and person and understands the overall treatment plan and is agreeable to staying on medicine and going home to stay with her parents. Denies any suicidal or homicidal ideation. Overall judgment and insight had some obvious chronic impairment from her cognitive problems.   DIAGNOSIS PRINCIPLE AND PRIMARY:  AXIS I: Depression, not otherwise specified.   SECONDARY DIAGNOSES:  AXIS I: Rule out major depressive episode, severe, recurrent.   AXIS II: Mental retardation, moderate.   AXIS III: History of hyperlipidemia, controlled. Acute urinary tract infection.   AXIS IV: Moderate. Chronic stress from worrying about her long-term situation in regards to a place to live and her parent's elderly health.   AXIS V: Functioning at time of discharge: 46.   ____________________________ Gonzella Lex, MD jtc:bjt D: 10/01/2012 12:32:16 ET T: 10/01/2012 12:42:35 ET JOB#: [id number]  cc: Gonzella Lex, MD,  <Dictator> Gonzella Lex MD ELECTRONICALLY SIGNED 10/01/2012 15:17

## 2015-03-21 NOTE — H&P (Signed)
PATIENT NAME:  Rita [name], Rita [name] MR#:  [id number] DATE OF BIRTH:  [birth year]  DATE OF ADMISSION:  09/29/2012  IDENTIFYING INFORMATION AND CHIEF COMPLAINT: 54 year old woman with a history of depression recurrent and mental retardation admitted voluntarily.   CHIEF COMPLAINT: "I'm feeling real depressed."   HISTORY OF PRESENT ILLNESS: Information obtained from the patient and from the chart and from the patient's mother. Patient reports that she has been feeling depressed and sad for several weeks. Her energy level is low. She is tearful much of the time. She is sleeping poorly. She says that she has had suicidal thoughts and thought about overdosing on pills. She also says that she has auditory hallucinations that she hears. Her mother confirms that the patient has been tearful and sad and feeling down recently. The patient's only reason that she can recall is that at school recently she was present when two other students were fighting with each other and this upset her. The mother, however, thinks the symptoms have been going on longer than that. Patient says that she is compliant with all of her medicine. Not using any substances. She saw her therapist earlier today and then saw me for follow up medication management today at which point she reported the depression with suicidal ideation.   PAST PSYCHIATRIC HISTORY: Patient has had several psychiatric admissions going back several years. Diagnosis of depression and psychosis. It has also been noted several times in her chart that she has a tendency to exaggerate or state symptoms in an attempt to get attention or to get the outcome that she wants. She has been treated with a variety of antidepressants and antipsychotics and anxiolytics in the past. Most recently she was on a combination of Cymbalta and Abilify which she has been on since March. She does have a history of at least two attempts of overdosing on medications in suicide attempts. She is currently  followed by a therapist and for medication management.   SOCIAL HISTORY: Patient is unmarried. Moderately mentally retarded. Lives with her elderly parents. In the past she has lived in group homes at times but recently has been staying at home with her parents. She recently tried to go back to attend school at [employer] but apparently this is not working out. There have been issues in the last few years where the patient has gotten upset at the idea that she might ultimately have to move back to a group home but this is not currently apparently being discussed by her family.   PAST MEDICAL HISTORY: Patient has a history of recurrent headaches. Also a history of hypercholesterolemia.  SUBSTANCE ABUSE HISTORY: Does not abuse alcohol or drugs.   FAMILY HISTORY: No known family history of mental illness.   REVIEW OF SYSTEMS: Patient says that she is feeling sad. She feels tired. She denies any physical pain. Denies any shortness of breath. Denies any chest pain. Denies any other significant physical symptoms.   MENTAL STATUS EXAM: Slightly disheveled oddly dressed woman, looks her stated age. She is alert and oriented. Eye contact is poor. Psychomotor activity is poor. Affect is odd and blunted, not tearful right now. Mood stated as being depressed. Thoughts are limited in amount, very simple and concrete. No evident bizarre or delusional thinking. Does claim she has auditory hallucinations but cannot describe them. Told me that she had suicidal and homicidal thoughts but was vague about whether she really wanted to die. Baseline intelligence obviously low and impaired. Impaired short and  longer term memory. Impaired judgment and insight.   PHYSICAL EXAMINATION:  GENERAL: Patient does not appear to be in any acute physical distress.   HEENT: Pupils are equal and reactive. Face symmetric. Oral mucosa normal.   BACK AND NECK: Nontender.   MUSCULOSKELETAL: Full range of motion at  extremities. Gait within normal limits.   LUNGS: Clear with no wheezes.   HEART: Regular rate and rhythm.   ABDOMEN: Soft, nontender, normal bowel sounds.   NEUROLOGICAL: Cranial nerves intact and symmetric. Strength and reflexes symmetric and intact upper and lower.   VITAL SIGNS: Blood pressure 100/75, respirations 18, pulse 81.   CURRENT MEDICATIONS:  1. Cymbalta 60 mg in the morning and 30 mg at night.  2. Simvastatin 20 mg at night.  3. Abilify 10 mg per day.   ALLERGIES: No known drug allergies.   ASSESSMENT: 54 year old woman with moderate mental retardation, history of recurrent episodes of depression. Recent episode has been going on for several weeks. Patient is reporting to me today having psychotic symptoms and suicidal ideation. Apparently, this was a bit of a surprise to her family. Family is of the opinion that she is taking too much medication and that somehow her medication is causing her symptoms. I doubt that that is the case but it may be that her symptoms as previously are exaggerated to get her desired outcome. In any case at this point she needs hospitalization because of a history of suicidal behavior.   TREATMENT PLAN: Admit to psychiatry. Continue current medications except not the Abilify which the family has never been happy about. Replace it with Seroquel 100 mg at night, which she has taken in the past. Otherwise, be conservative about managing medication. Engage her in groups and activities on the unit. Monitor her mood and behavior.   DIAGNOSIS PRINCIPLE AND PRIMARY:  AXIS I: Major depression, recurrent, severe.   SECONDARY DIAGNOSES:  AXIS I: No further.   AXIS II: Mental retardation, moderate.   AXIS III: Hypercholesterolemia.   AXIS IV: Moderate to severe from chronic disability and awareness of her parent's limitations.   AXIS V: Functioning at time of admission 30. ____________________________ Gonzella Lex, MD jtc:cms D: 09/29/2012  18:18:38 ET T: 09/30/2012 05:37:37 ET  JOB#: [id number] cc: Gonzella Lex, MD, <Dictator> Gonzella Lex MD ELECTRONICALLY SIGNED 09/30/2012 12:05

## 2015-03-24 NOTE — Consult Note (Signed)
Brief Consult Note: Diagnosis: mental retardation with behavioral disturbance.   Patient was seen by consultant.   Consult note dictated.   Recommend further assessment or treatment.   Orders entered.   Discussed with Attending MD.   Comments: Psychiatry: Patient seen. Moderate mental retardation acutely agitated. Took od of "diet pills" yesterday. Currently unable to give history. Will order acute ativan and phenergan iv and prn plus acute im geodon 10mg  for agitation and prn and try to continue evaluation.  Electronic Signatures: Gonzella Lex (MD)  (Signed 16-May-14 14:13)  Authored: Brief Consult Note   Last Updated: 16-May-14 14:13 by Gonzella Lex (MD)

## 2015-03-24 NOTE — Consult Note (Signed)
PATIENT NAME:  Rita [name], Rita [name] MR#:  [id number] DATE OF BIRTH:  [birth year]  DATE OF CONSULTATION:  04/16/2013  CONSULTING PHYSICIAN:  Gonzella Lex, MD  IDENTIFYING INFORMATION AND REASON FOR CONSULTATION: This is a 54 year old woman with moderate to severe mental retardation, who was brought into the Emergency Room by her family because of acute agitation after probably overdosing on stimulants. The patient unable to offer any complaints. Consultation for appropriate management.   HISTORY OF PRESENT ILLNESS: Information obtained from the chart and a conversation with the patient's mother. The patient unable to give any history. Mother reports that the patient had started in taking diet pills again, which she has done at times in the past, thinking that she is too fat. She had been complaining of being too fat recently. She somehow got a hold of some diet pills of unknown type, probably with ephedrine-like drugs in them. She took an unknown large quantity of them, not apparently in an attempt to harm herself. She became agitated. Has been complaining of abdominal pain, is feeling sick to her stomach, has been throwing up even more than usual. Was agitated greatly at home, requiring being brought into the Emergency Room.   PAST PSYCHIATRIC HISTORY: The patient has moderate mental retardation, a long history of being extremely impaired. Multiple previous hospitalizations. Frequently voices suicidal ideation out of impulsive anger, but also has taken overdoses and hurt herself in the past. The patient has been on Seroquel 100 mg at night, Cymbalta 30 mg twice a day, Zocor for cholesterol. Multiple medications have been tried in the past, with nothing being really clearly a great benefit long term. She is currently also seeing a therapist for outpatient therapy, which is of some help.   PAST MEDICAL HISTORY: No significant chronic medical problems. Does get frequent urinary tract infections. Has elevated  cholesterol.   SOCIAL HISTORY: The patient lives with her elderly parents. She is very dependent on them and has never been able to function effectively away from them. Attempts have been made to place her in group homes, and she has always decompensated so badly she had to come back to live with her parents. This has been a major concern for the parents, who obviously are getting along in years.   SUBSTANCE ABUSE HISTORY: Has a history of overdosing, sometimes intentionally to hurt herself, sometimes, as in this case, apparently just to take extra medicine, but no real history of substance abuse.   MEDICATIONS ON ADMISSION: Seroquel 100 mg at night, Cymbalta 60 mg per day, Septra DS 1 twice a day.   ALLERGIES: No known drug allergies.   REVIEW OF SYSTEMS: The patient said that her tummy hurt at one point. Otherwise, could not make any specific complaints.   MENTAL STATUS EXAMINATION: Disheveled, dirty, out-of-control woman seen in the Emergency Room. When I first went in to see her, she was screaming uncontrollably nonstop. I tried to talk with her, and she would not make eye contact. She did eventually say that she wanted something to drink, and when she was given something to drink, she slurped it down very quickly and then threw up again. Affect is sad, angry, raging. Mood is not stated. Thoughts very primitive, impossible to assess. Does not seem to be responding to internal stimuli in the sense of hallucinations. Will not answer questions about suicidal or homicidal ideation. Judgment and insight nonexistent.   PHYSICAL EXAMINATION:  GENERAL: Full physical not done right now, but the patient has a  rash all down the front of her face, probably from where she has been vomiting all over herself. She feels somewhat feverish to me, but is not febrile. She is able to move all of her limbs, move her body around.  VITAL SIGNS: Temperature 98.4, pulse 110, respirations 20, blood pressure 120/68.    LABORATORY RESULTS: Drug screen is all negative, including being negative for stimulants. Chemistry shows elevated glucose 149, potassium low at 3, CO2 low at 20, total protein elevated at 8.5. Alcohol undetected. CBC normal. TSH normal at 2.86.   ASSESSMENT: A 54 year old woman with mental retardation, with accompanying mood instability. Decompensation possibly in part due to overusing medication, although this may also just be a mood episode. Currently, medically unstable with constant vomiting, screaming, out-of-control behavior. Impossible to admit her to the psychiatry ward right now as she needs constant medical attention for all of her activities of daily living and is extremely disruptive.   TREATMENT PLAN: Attempted to talk with her to try and work on an appropriate plan, but she was not able to cooperate. Ultimately, we had to give her some shots of antipsychotic and sedative, which finally seemed to calm her down a bit. Her mother has come in to visit her and is trying to help calm her down. The patient will be continued on her usual outpatient medicine. If she can calm down enough to be basically cooperative, we can either admit her to psychiatry, or if she is better enough, discharge her home at the discretion of her parents.   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   SECONDARY DIAGNOSES:  AXIS II: Mental retardation, moderate to severe.  AXIS III: Obesity.  AXIS IV: Moderate to severe chronic stress from burden of illness.  AXIS V: Functioning at time of evaluation 25.   ____________________________ Gonzella Lex, MD jtc:OSi D: 04/16/2013 82:50:03 ET T: 04/17/2013 06:15:55 ET JOB#: [id number]  cc: Gonzella Lex, MD, <Dictator> Gonzella Lex MD ELECTRONICALLY SIGNED 04/19/2013 9:46

## 2015-03-25 NOTE — H&P (Signed)
PATIENT NAME:  Rita [name], UMPHLETT MR#:  [id number] DATE OF BIRTH:  09-Jan-1961  DATE OF ADMISSION:  12/27/2013  PRIMARY CARE PHYSICIAN: Golden Pop, MD  CHIEF COMPLAINT: Abdominal pain.   HISTORY OF PRESENT ILLNESS: This is a 54 year old female with history of mental retardation, depression, hyperlipidemia, and headaches. She presents to the ER with abdominal pain in the epigastric area with some nausea and retching and some diarrhea. No recent antibiotics. No travel out of the country. Mother recently sick from her sister with similar symptoms. Diarrhea 3 to 4 times per day, mostly brown, but did give her Pepto-Bismol and was a black stool after that. She does have some chest pain, had some fever. In the ER, found to be a little bit dehydrated with hypokalemia. A CT scan of the abdomen and pelvis was negative. Hospitalist services were contacted for further evaluation because the patient has not eaten very much over the last few days.   PAST MEDICAL HISTORY: Depression, hyperlipidemia, headaches, mental retardation.   PAST SURGICAL HISTORY: Carpal tunnel.   ALLERGIES: No known drug allergies.   SOCIAL HISTORY: Lives with parents, never able to work. No smoking. No alcohol. No drug use.   FAMILY HISTORY: Mother with COPD. Father with heart disease and hip replacements.   MEDICATIONS: Include acetaminophen 500 mg every 6 hours as needed, clonazepam 0.5 mg twice a day, Colace 200 mg twice a day, Cymbalta 30 mg in the afternoon and 60 mg in the morning, multivitamin 1 tablet daily, Seroquel 100 mg at bedtime, sumatriptan as needed injection for migraine, Topamax 100 mg daily, trazodone 50 mg at bedtime, and Zocor 20 mg at bedtime.  REVIEW OF SYSTEMS:  CONSTITUTIONAL: Positive for weight gain. Positive for fever, chills, and sweats.  EYES: Does wear glasses.  EARS, NOSE, MOUTH AND THROAT: No hearing problems, but ears bothersome. No sore throat. No difficulty swallowing.  CARDIOVASCULAR: Positive  for chest pain. No palpitations.  RESPIRATORY: No shortness of breath. No cough. No sputum. No hemoptysis.  GASTROINTESTINAL: Positive for nausea. Positive for abdominal pain. Positive for diarrhea. No bright red blood per rectum. No melena.  GENITOURINARY: No burning on urination. No hematuria.  MUSCULOSKELETAL: No joint pain or muscle pain.  INTEGUMENT: No rashes or eruptions.  NEUROLOGIC: No fainting or blackouts.  PSYCHIATRIC: On medication for depression.  ENDOCRINE: No thyroid problems.  HEMATOLOGIC AND LYMPHATIC: No anemia.   PHYSICAL EXAMINATION: VITAL SIGNS: On presentation included a temperature of 98.7, pulse 111, respirations 20, blood pressure 120/75, and pulse ox 96% on room air.  GENERAL: No respiratory distress.  EYES: Conjunctivae and lids normal. Pupils equal, round, and reactive to light. Extraocular muscles intact. No nystagmus.  EARS, NOSE, MOUTH, AND THROAT: Tympanic membranes: No erythema. Nasal mucosa: No erythema. Throat: No erythema. No exudate seen. Lips and gums: No lesions.  NECK: No JVD. No bruits. No lymphadenopathy. No thyromegaly. No thyroid nodules palpated.  RESPIRATORY: Lungs clear to auscultation. No use of accessory muscles to breathe. No rhonchi, rales, or wheeze heard.  CARDIOVASCULAR: S1, S2 normal. No gallops, rubs, or murmurs heard. Carotid upstroke 2+ bilaterally. No bruits. Dorsalis pedis pulses 2+ bilaterally. Trace edema of the lower extremities.  ABDOMEN: Soft. Positive tenderness in the epigastric area and mid abdomen. No organosplenomegaly. Normoactive bowel sounds. No masses felt.  LYMPHATIC: No lymph nodes in the neck.  MUSCULOSKELETAL: No clubbing, edema, or cyanosis.  SKIN: No rashes or ulcers seen.  NEUROLOGIC: Cranial nerves II through XII grossly intact. Deep tendon reflexes 1+  bilateral lower extremities.  PSYCHIATRIC: The patient alert to person and place.   LABORATORY AND RADIOLOGICAL DATA: EKG shows bifascicular block, normal  sinus rhythm.   CT scan of the abdomen and pelvis is negative.   Chest x-ray is negative.   Urinalysis with 1+ leukocyte esterase. White blood cell count 8.5, H and H 14.4 and 44, and platelet count 233. Glucose 99, BUN 18, creatinine 1.60, sodium 139, potassium 3.4, chloride 110, CO2 22, and calcium 9. Liver function tests normal range. Troponin negative.   ASSESSMENT AND PLAN: 1.  Gastroenteritis with nausea, vomiting, and diarrhea. We will give IV fluid hydration, IV Protonix. ER physician gave Cipro for possible urinary tract infection. We will also give Flagyl. Send off stool studies and continue to monitor supportively. We will admit as an observation.  2.  Dehydration on top of chronic kidney disease. We will give IV fluid hydration and check a creatinine in the a.m.  3.  Hypokalemia. Will replace potassium in IV fluids.  4.  Mental retardation, depression, and anxiety. Continue psychiatric medications.  5.  Hyperlipidemia. Continue Zocor.  CODE STATUS: The patient is a FULL code.   TIME SPENT ON ADMISSION: 55 minutes.  ____________________________ Tana Conch. Leslye Peer, MD rjw:sb D: 12/27/2013 15:49:49 ET T: 12/27/2013 16:13:14 ET JOB#: [id number]  cc: Tana Conch. Leslye Peer, MD, <Dictator> Guadalupe Maple, MD Marisue Brooklyn MD ELECTRONICALLY SIGNED 12/31/2013 15:23

## 2015-03-25 NOTE — Discharge Summary (Signed)
PATIENT NAME:  Rita [name], Rita [name] MR#:  [id number] DATE OF BIRTH:  1961-05-03  DATE OF ADMISSION:  12/27/2013 DATE OF DISCHARGE:  12/28/2013  DISCHARGE DIAGNOSES:  1. Acute viral gastroenteritis. 2. Acute renal failure, dehydration, improved.  3. Electrolyte imbalance, hypokalemia, hypomagnesemia.  4. Migraine headache.   CODE STATUS: Full code.   MEDICATIONS ON DISCHARGE:  1. Seroquel 100 mg oral tablet once a day.  2. Zocor 20 mg oral tablet once a day. 3. Cymbalta 30 mg oral delayed-release capsule once a day.  4. Cymbalta 60 mg oral delayed-release in the morning.  5. Clonazepam 0.5 mg oral tablet 2 times a day.  6. Trazodone 50 mg oral tablet once a day.  7. Topiramate 100 mg oral tablet once a day.  8. Sumatriptan subcutaneous 1 dose as needed for headache.  9. Acetaminophen 500 mg oral tablet every 6 hours as needed.  10. Metronidazole 500 mg oral tablet every 8 hours for 3 days.  11. Fioricet 325 mg tablet every 6 hours for 5 days as needed for headache.  12. Ciprofloxacin 250 mg oral tablet every 12 hours for 3 days.   DIET ON DISCHARGE: Regular. Diet consistency: Regular.   ACTIVITY LIMITATION: As tolerated.   TIMEFRAME TO FOLLOWUP: Within 1 to 2 weeks. She was advised to follow with primary care physician, Dr. Golden Pop.   HISTORY OF PRESENTING ILLNESS: A 54 year old female with history of mental retardation, depression, hyperlipidemia, headache, who presented to Emergency Room with abdominal pain, epigastric area, with some nausea and retching with diarrhea. No recent antibiotics. No travel out of country. Diarrhea was 3 to 4 times per day, mostly brown. Also had some chest pain and some fever, so in Emergency Room, she was also noticed a little bit dehydrated with hypokalemia. CAT scan of abdomen and pelvis was done, which was negative for any findings. Hospitalist service was contacted for further management.   HOSPITAL COURSE AND STAY: She was started on IV fluids and  antibiotics, and next day, she was feeling much better, and she was tolerating the diet without feeling any nausea, and diarrhea stopped. Stool for C. difficile was done, which was negative, so she was discharged home.   Dehydration. IV hydration was given, which helped improvement in her symptoms.   Hypokalemia and hypomagnesemia. We gave replacement and discharged her.   Mental retardation, depression and anxiety. These were chronic complaints. She was on psychiatric medication, and we discharged with the same, did not change anything.   Hyperlipidemia. We continued Zocor.   Migraine headaches. Fioricet as needed, was given prescription to her on discharge.  IMPORTANT LABORATORY RESULTS IN THE HOSPITAL:  Urinalysis was mild positive, 7 WBC and 1+ leukocyte esterase. She was given ciprofloxacin for that.  WBC 8.5, hemoglobin 14.4, platelet count 233.  Glucose 99, creatinine 1.6 on admission, sodium 139 and potassium 3.4.  Troponin less than 0.02. CT scan of the abdomen and pelvis with contrast: No evidence of bowel obstruction or ileus. No intra-abdominal or pelvic abscess. No acute hepatobiliary abnormality. No acute urinary tract abnormality.  Urine culture was mixed bacterial organism.  Troponin remained negative.  Stool culture: No Salmonella, Shigella, no pathogen, no Campylobacter. No RBCs or WBCs were seen in the stool. C. difficile was negative.   TOTAL TIME SPENT ON THIS DISCHARGE: 40 minutes.     ____________________________ Ceasar Lund Anselm Jungling, MD vgv:lb D: 12/31/2013 20:36:48 ET T: 01/01/2014 07:35:28 ET JOB#: [id number]  cc: Ceasar Lund. Anselm Jungling, MD, <Dictator> Guadalupe Maple,  MD [name] MD ELECTRONICALLY SIGNED 01/06/2014 23:57

## 2015-03-26 NOTE — Discharge Summary (Signed)
PATIENT NAME:  UMEKA, WRENCH MR#:  [id number] DATE OF BIRTH:  17-Sep-1961  DATE OF ADMISSION:  02/26/2012 DATE OF DISCHARGE:  02/28/2012  HISTORY OF PRESENT ILLNESS: Ms. Singleton is a 54 year old female admitted to the Inpatient Behavioral Unit on March 27th with command self-destructive auditory hallucinations. She had taken an overdose of diet pills within the week prior to admission. She was also showing frequent crying, insomnia, and catastrophic anxiety. The catastrophic anxiety included severe feeling on edge.   ANCILLARY CLINICAL DATA: None.   HOSPITAL COURSE: Ms. Tindall was admitted to the Inpatient Behavioral Unit. She progressed to participating in milieu and group psychotherapy. Her intellectually challenged baseline was not inhibitory in her being able to gain from group. She tended to listen more than talk. Her catastrophic anxiety resolved. Her auditory hallucinations also resolved.   Ms. Tindall was continued on her duloxetine at a total of 90 mg per day. Abilify was added at 10 mg at bedtime for antipsychosis and augmenting the duloxetine as well as mood stabilization.   CONDITION ON DISCHARGE: By March 29th, Ms. Cathell was demonstrating normal mood, constructive future goals and interests. She particularly expressed interest in getting home to her pet dog. She was able to smile appropriately. She was not having any delusions or hallucinations. She was not showing any thoughts of harming herself or others.   Also, her parents called the undersigned to state that the patient was back to her normal, stable, and constructive mood state.   MENTAL STATUS EXAM UPON DISCHARGE: Ms. Grays is alert. Her eye contact is good. Concentration is normal. She is oriented to all spheres. Her memory is intact to immediate, recent, and remote. Her fund of knowledge, use of language, and intelligence are mild to moderately below normal which is her baseline. Her speech does involve her baseline dysarthria.  Thought process is logical and coherent. There are no looseness of associations or tangents. Thought content no thoughts of harming herself, no thoughts of harming others. No delusions or hallucinations. Her affect is broad and appropriate. Mood within normal limits. Insight is intact for her recovery from her mental disturbance and the need to continue preventive medication. Her judgment is intact for all abiding by her structured environment and schedule at home as well as her school.   Mr. Zellers is not at risk to harm herself or others. She agrees to call emergency services immediately for any thoughts of harming herself, thoughts of harming others, distress, or return of hallucinations.   DISCHARGE DIAGNOSES:  AXIS I:  1. Schizoaffective disorder, most recent episode depressed with psychotic features.  2. Anxiety disorder, not otherwise specified, stable.   AXIS II: Intellectually challenged.   AXIS III: Hypercholesterolemia.   AXIS IV: Primary support group.   AXIS V: 55.   DIET: Regular.   ACTIVITY: Routine.   DISCHARGE MEDICATIONS:  1. Abilify 10 mg 1 p.o. at bedtime. 2. Duloxetine 60 mg q.a.m., 30 mg at bedtime. 3. Lodine 400 mg b.i.d.   4. Multivitamin daily.  5. Zocor 20 mg at bedtime.   FOLLOW-UP: Follow-up will be with the undersigned in the undersigned's outpatient office at [facility]. She will also have follow-up with her general medical physician.  ____________________________ Drue Stager. Jasyah Theurer, MD jsw:drc D: 03/01/2012 11:32:19 ET T:  03/02/2012 14:06:02 ET JOB#: [id number] cc: Drue Stager. Female Minish, MD, <Dictator> Billie Ruddy MD ELECTRONICALLY SIGNED 03/02/2012 28:76

## 2015-03-26 NOTE — H&P (Signed)
PATIENT NAME:  Rita [name], Rita [name] MR#:  [id number] DATE OF BIRTH:  May 29, 1961  DATE OF ADMISSION:  02/26/2012  CHIEF COMPLAINT AND IDENTIFYING DATA: [name] is a 54 year old mentally challenged female presenting to the Emergency Department after having acute psychosis and an intentional overdose within the past week.   [name] began experiencing very annoying auditory hallucinations last week. These have exacerbated over the week. Also last week she became severely depressed and extremely distressed. She overdosed on an unknown number of diet pills. She was seen in the Emergency Department at that time and was released to outpatient care.   However, at this time she presents again with an emergency; having auditory hallucinations that are self-destructive in instruction.   She has very frequent crying, insomnia, severe feeling on edge, depressed mood and suicidal thoughts. The duration of these symptoms appears to be approximately eight days with a progressive pattern.   There are no precipitating stresses known. She does attend a special school for the mentally challenged. She does live at home in a supportive environment with her mother.   PAST PSYCHIATRIC HISTORY: [name] does have a history of baseline mental challenge from birth. She does attend a special school. She has a history of severe psychotic episodes involving hallucinations. She also has a history of severe depressive episodes involving at least two weeks of depressed mood, anhedonia, poor concentration, easy crying and insomnia. Her outpatient psychotropic medications recently utilized have addressed her anxiety and her depression. She did not report auditory hallucinations until the past week.   Up until the past week she was maintaining stable mood and control of her anxiety with Cymbalta 60 mg q.a.m., 30 mg at bedtime and Klonopin 0.5 mg t.i.d. for anti-acute anxiety.   She is followed by the undersigned in the [place] clinic on an outpatient basis.   She has required multiple psychiatric admissions in the past.   FAMILY PSYCHIATRIC HISTORY: None known.   SOCIAL HISTORY: [name] does not use any alcohol, illegal drugs, or tobacco. She lives at home with a very supportive mother. She has attempted to reside in group homes before, however, this has been unsuccessful. She goes to a special school for the mentally challenged.   OCCUPATION: None.   PAST MEDICAL HISTORY:  1. Osteoarthritis. 2. Hypercholesterolemia.   ALLERGIES: No known drug allergies.   LABORATORY, DIAGNOSTIC AND RADIOLOGICAL DATA: SGPT, SGOT unremarkable. Creatinine mildly increased at 1.36. TSH normal. Aspirin negative. CBC unremarkable. Ethanol negative. Tylenol negative. Urinalysis unremarkable. Urine drug screen negative.   REVIEW OF SYSTEMS: Constitutional, HEENT, mouth, neurologic, psychiatric, cardiovascular, respiratory, gastrointestinal, genitourinary, skin, musculoskeletal, hematologic, lymphatic, endocrine, metabolic all unremarkable.   PHYSICAL EXAMINATION: The examination was conducted with female staff escort.   VITAL SIGNS: Temperature 98.3, pulse 90, respiratory rate 18, blood pressure 147/79.   GENERAL APPEARANCE: [name] is a middle-aged female lying in a supine position in her hospital bed with no abnormal involuntary movement. She has no cachexia. Her grooming and hygiene are normal. Her muscle tone is normal.   HEENT: Normocephalic, atraumatic. Pupils equally round and reactive to light and accommodation. Oropharynx clear without erythema.   NECK: Supple, nontender, no masses.   SKIN: Normal turgor. No rashes.   EXTREMITIES: No cyanosis, clubbing, or edema.   RESPIRATORY: Clear to auscultation. No wheezing, rhonchi, or rales.   CARDIOVASCULAR: Regular rate and rhythm. No murmurs, rubs, or gallops.   ABDOMEN: Bowel sounds positive. Soft, nontender.   GENITOURINARY:  Deferred.   NEUROLOGICAL: Cranial nerves II through XII intact. General sensory intact throughout to light touch. Motor 5/5 strength throughout. Deep tendon reflexes normal strength and symmetry throughout. No Babinski. Patient does not make sufficient effort at finger-to-nose testing bilaterally, however, her efforts are symmetric. She has normal gait for intact coordination.   ASSESSMENT:  AXIS I:  1. Major depressive disorder, recurrent, with psychotic features, rule out schizoaffective disorder.  2. Anxiety disorder, not otherwise specified.   AXIS II: Intellectually challenged.   AXIS III: Hypercholesterolemia.   AXIS IV: Academic, primary support group.   AXIS V: 30.   [name] has developed a period of emergency symptoms over the past week resulting in one overdose and two visits to the Emergency Department. She is having psychosis with command self-destructive auditory hallucinations therefore [name] is at risk for suicide.   PLAN: Will admit [name] to the inpatient behavioral unit.   As she can, she will progress to milieu and group psychotherapy.   Regarding psychotropic medication, will start Abilify for anti-psychosis and augmenting her Cymbalta and antidepression. The Abilify can also help with the acute catastrophic anxiety.   For anti-acute anxiety will utilize Ativan 1 to 2 mg p.o. or IM every six hours p.r.n.   She will be continued on her Cymbalta 60 mg q.a.m. and 30 mg at bedtime for antidepression.   ____________________________ Drue Stager. Lavone Weisel, MD jsw:cms D: 02/28/2012 12:44:24 ET T: 02/28/2012 15:30:06 ET JOB#: [id number]  cc: Drue Stager. Arianne Klinge, MD, <Dictator> Billie Ruddy MD ELECTRONICALLY SIGNED 02/28/2012 23:20

## 2015-05-23 ENCOUNTER — Encounter: Payer: Self-pay | Admitting: *Deleted

## 2015-06-07 ENCOUNTER — Other Ambulatory Visit: Payer: Self-pay

## 2015-06-07 NOTE — Telephone Encounter (Signed)
Received fax for patients OTC Multivitamin. Order placed, please review and send.  [employer]

## 2015-06-08 ENCOUNTER — Other Ambulatory Visit: Payer: Self-pay | Admitting: Unknown Physician Specialty

## 2015-06-08 ENCOUNTER — Other Ambulatory Visit: Payer: Self-pay | Admitting: Family Medicine

## 2015-06-08 MED ORDER — THERA VITAL M PO TABS: 1.0000 | ORAL_TABLET | Freq: Every day | ORAL | Status: DC

## 2015-06-08 NOTE — Telephone Encounter (Signed)
Patient due for PE in August- nothing scheduled. Should have enough meds to get her to august. Please schedule her for PE in August with [facility], if going run out before then, will get her enough to make it to her appointment.

## 2015-06-09 NOTE — Telephone Encounter (Signed)
Rx sent to her pharmacy 

## 2015-06-09 NOTE — Telephone Encounter (Signed)
Called and spoke to patient's caregiver. Scheduled a physical on July 05, 2015 and the caregiver stated that the patient will run out of the medication before her appointment.

## 2015-06-14 ENCOUNTER — Ambulatory Visit: Payer: Self-pay | Admitting: Licensed Clinical Social Worker

## 2015-06-28 ENCOUNTER — Ambulatory Visit (INDEPENDENT_AMBULATORY_CARE_PROVIDER_SITE_OTHER): Payer: 59 | Admitting: Licensed Clinical Social Worker

## 2015-06-28 DIAGNOSIS — F411 Generalized anxiety disorder: Secondary | ICD-10-CM

## 2015-06-28 DIAGNOSIS — F3341 Major depressive disorder, recurrent, in partial remission: Secondary | ICD-10-CM

## 2015-06-28 DIAGNOSIS — F819 Developmental disorder of scholastic skills, unspecified: Secondary | ICD-10-CM | POA: Diagnosis not present

## 2015-06-28 NOTE — Progress Notes (Signed)
   THERAPIST PROGRESS NOTE  Session Time: 10:08 a.m. - 11:05 a.m.  Participation Level: Active  Behavioral Response: NeatAlertEuthymic  Type of Therapy: Individual Therapy & Case collaboration with client's Merlene Morse case manager and group home owner/care-giver  Treatment Goals addressed: Communication: Assertiveness in interpersonal relationships and Coping  Interventions: Solution Focused, Strength-based and Social Skills Training  Summary: Rita [name] is a 54 y.o. female who returns to [place] today accompanied by her case worker with [facility] [place] and group home owner, Lurlean Horns was receptive to these ladies being present for therapy session today.  [name]'s speech is slow yet coherent and she shared with LCSW various activities that she continues to participate in as well as new group that she recently started attending called [employer] with a local studio. She recently enjoyed a trip with her parents to see her brother for his birthday celebration and on occasion will visit with parents and swim in their pool.  Client's mother has been busy caring for her ill sister who is in a memory care unit locally.  [name] happily reported to LCSW that she has lost a few pounds and denied thoughts of wanting to use diet pills or to harm herself.  [name] and staff present discussed a few social situations that will often set client back emotionally and leads [name] to express desire to do or have what peers do.  Skills used to move self through these emotions were shared with LCSW.  Client sees herself as doing better and enjoys staying as active as she is and staff present praised [name] for her ongoing progress in emotion regulation and following established schedules and rules.  The treatment plan goal remains per client "To no let others influence how I feel." as do appropriate interventions.  Client, staff and LCSW agreed to discontinue the goal "Just not to call." This was in  reference to [name]'s frequent calling various medical providers to make her own appointments without consulting [name].  Per [name], this is no longer a problem.  No new concerns or needs were identified during session today.  Suicidal/Homicidal: Negativewithout intent/plan  Therapist Response:   Supportive psychotherapy with insight into client's gains in terms of social skills and behavioral modifcation.  Also provided to client social and emotional support and encouragement. Commended [name] on staying motivated and increasing socialization and engagement in meaningful activities.   Explored role of gratitude when feeling faced with things that are unpleasant and seem unfair to her along with reinforcement of how to re-frame or gather more information to decrease experiences with negative thoughts. Encouraged calls between sessions PRN.  Reviewed treatment plan and discussed client's role/responsibilites and those of others involved in her care.   Plan: Return again in four weeks. LCSW will update client's treatment plan to accurately reflect where client is in the treatment process.  LCSW will be available to coordinate client's care with other healthcare/behavioral healthcare providers PRN.  Diagnosis: Major Depressive Disorder, Recurrent, In Partial Remission   Anxiety State Unspecified   Intellectual Disability    Miguel Dibble, LCSW 06/28/2015

## 2015-07-04 DIAGNOSIS — N183 Chronic kidney disease, stage 3 unspecified: Secondary | ICD-10-CM | POA: Insufficient documentation

## 2015-07-04 DIAGNOSIS — K219 Gastro-esophageal reflux disease without esophagitis: Secondary | ICD-10-CM | POA: Insufficient documentation

## 2015-07-04 DIAGNOSIS — F819 Developmental disorder of scholastic skills, unspecified: Secondary | ICD-10-CM | POA: Insufficient documentation

## 2015-07-04 DIAGNOSIS — G44229 Chronic tension-type headache, not intractable: Secondary | ICD-10-CM | POA: Insufficient documentation

## 2015-07-04 DIAGNOSIS — G43909 Migraine, unspecified, not intractable, without status migrainosus: Secondary | ICD-10-CM | POA: Insufficient documentation

## 2015-07-04 DIAGNOSIS — F79 Unspecified intellectual disabilities: Secondary | ICD-10-CM | POA: Insufficient documentation

## 2015-07-04 DIAGNOSIS — F89 Unspecified disorder of psychological development: Secondary | ICD-10-CM

## 2015-07-04 DIAGNOSIS — F6810 Factitious disorder, unspecified: Secondary | ICD-10-CM | POA: Insufficient documentation

## 2015-07-04 DIAGNOSIS — K5909 Other constipation: Secondary | ICD-10-CM | POA: Insufficient documentation

## 2015-07-04 DIAGNOSIS — E785 Hyperlipidemia, unspecified: Secondary | ICD-10-CM | POA: Insufficient documentation

## 2015-07-05 ENCOUNTER — Encounter: Payer: Self-pay | Admitting: Unknown Physician Specialty

## 2015-07-05 ENCOUNTER — Ambulatory Visit (INDEPENDENT_AMBULATORY_CARE_PROVIDER_SITE_OTHER): Payer: Medicare Other | Admitting: Unknown Physician Specialty

## 2015-07-05 VITALS — BP 120/83 | HR 98 | Temp 98.2°F | Ht 60.1 in | Wt 215.2 lb

## 2015-07-05 DIAGNOSIS — E785 Hyperlipidemia, unspecified: Secondary | ICD-10-CM | POA: Diagnosis not present

## 2015-07-05 DIAGNOSIS — G43709 Chronic migraine without aura, not intractable, without status migrainosus: Secondary | ICD-10-CM | POA: Diagnosis not present

## 2015-07-05 DIAGNOSIS — K2100 Gastro-esophageal reflux disease with esophagitis, without bleeding: Secondary | ICD-10-CM

## 2015-07-05 DIAGNOSIS — N183 Chronic kidney disease, stage 3 unspecified: Secondary | ICD-10-CM

## 2015-07-05 MED ORDER — MAGNESIUM OXIDE 400 MG PO TABS: 400.0000 mg | ORAL_TABLET | Freq: Every day | ORAL | Status: DC

## 2015-07-05 MED ORDER — SIMVASTATIN 20 MG PO TABS: 20.0000 mg | ORAL_TABLET | Freq: Every day | ORAL | Status: DC

## 2015-07-05 MED ORDER — DOCUSATE SODIUM 100 MG PO CAPS: 100.0000 mg | ORAL_CAPSULE | Freq: Every day | ORAL | Status: DC | PRN

## 2015-07-05 MED ORDER — VITAMIN D (CHOLECALCIFEROL) 25 MCG (1000 UT) PO CAPS: 1000.0000 [IU] | ORAL_CAPSULE | Freq: Every day | ORAL | Status: DC

## 2015-07-05 MED ORDER — RANITIDINE HCL 150 MG PO TABS: 150.0000 mg | ORAL_TABLET | ORAL | Status: DC | PRN

## 2015-07-05 MED ORDER — THERA VITAL M PO TABS: 1.0000 | ORAL_TABLET | Freq: Every day | ORAL | Status: DC

## 2015-07-05 NOTE — Assessment & Plan Note (Addendum)
Last GFR was 54%.  Check CMP

## 2015-07-05 NOTE — Progress Notes (Signed)
BP 120/83 mmHg  Pulse 98  Temp(Src) 98.2 F (36.8 C)  Ht 5' 0.1" (1.527 m)  Wt 215 lb 3.2 oz (97.614 kg)  BMI 41.86 kg/m2  SpO2 99%  LMP  (LMP Unknown)   Subjective:    Patient ID: Rita [name], female    DOB: [birth year], 54 y.o.   MRN: [id number]  HPI: Rita [name] is a 54 y.o. female  Chief Complaint  Patient presents with  . Medicare Wellness  . Medication Refill   Pt is here for Medicare wellness.  She is here with her caregiver.  No new complaints.  Her mammogram was distressing.  Has been to a gyn for pap smear and her last one was March.   Reflux Ranitidine BID was stable. This has cut down on belching.     Migraine/headaches Taking the occasional Tylenol.  Still taking Topamax and very rarely Imitrex.  Sees neurology.    Hypercholesterol Taking daily Simvistatin.  No complaints of side  Effects.  Trying to follow a diet to lose weight.  Tries to moderate sugar and fat.    CKD Recheck CMP.  Avoiding NSAIDS.  Relevant past medical, surgical, family and social history reviewed and updated as indicated. Interim medical history since our last visit reviewed. Allergies and medications reviewed and updated.  Review of Systems  Constitutional: Negative.   HENT: Negative.   Eyes: Negative.   Respiratory: Negative.   Cardiovascular: Negative.   Gastrointestinal: Negative.   Endocrine: Negative.   Genitourinary: Negative.   Musculoskeletal: Negative.   Skin: Negative.   Allergic/Immunologic: Negative.   Neurological: Negative.   Hematological: Negative.   Psychiatric/Behavioral: Negative.     Per HPI unless specifically indicated above     Objective:    BP 120/83 mmHg  Pulse 98  Temp(Src) 98.2 F (36.8 C)  Ht 5' 0.1" (1.527 m)  Wt 215 lb 3.2 oz (97.614 kg)  BMI 41.86 kg/m2  SpO2 99%  LMP  (LMP Unknown)  Wt Readings from Last 3 Encounters:  07/05/15 215 lb 3.2 oz (97.614 kg)  01/09/15 214 lb (97.07 kg)    Physical Exam  Constitutional: She  is oriented to person, place, and time. She appears well-developed and well-nourished. No distress.  HENT:  Head: Normocephalic and atraumatic.  Eyes: Conjunctivae and lids are normal. Right eye exhibits no discharge. Left eye exhibits no discharge. No scleral icterus.  Cardiovascular: Normal rate, regular rhythm and normal heart sounds.   Pulmonary/Chest: Effort normal and breath sounds normal. No respiratory distress.  Abdominal: Normal appearance. There is no splenomegaly or hepatomegaly.  Musculoskeletal: Normal range of motion.  Neurological: She is alert and oriented to person, place, and time.  Skin: Skin is intact. No rash noted. No pallor.  Psychiatric: She has a normal mood and affect. Her behavior is normal. Judgment and thought content normal.  Vitals reviewed.   Results for orders placed or performed in visit on 05/23/15  [facility] MAMMOGRAPHY  Result Value Ref Range   [facility] Mammogram spreadsheet       Assessment & Plan:   Problem List Items Addressed This Visit      Unprioritized   CKD (chronic kidney disease) stage 3, GFR 30-59 ml/min    Last GFR was 54%.        Relevant Orders   Comprehensive metabolic panel   CBC   Uric acid   Microalbumin, Urine Waived   GERD (gastroesophageal reflux disease)   Relevant Medications   docusate sodium (COLACE)  100 MG capsule   magnesium oxide (MAG-OX) 400 MG tablet   ranitidine (ZANTAC) 150 MG tablet   Migraine - Primary   Relevant Medications   simvastatin (ZOCOR) 20 MG tablet   Hyperlipidemia   Relevant Medications   simvastatin (ZOCOR) 20 MG tablet   Other Relevant Orders   Comprehensive metabolic panel   Lipid Panel w/o Chol/HDL Ratio   Chronic kidney disease, stage 3       Follow up plan: Return in about 6 months (around 01/05/2016).  Needs flu shot when available.

## 2015-07-06 ENCOUNTER — Encounter: Payer: Self-pay | Admitting: Unknown Physician Specialty

## 2015-07-06 LAB — CBC
Hematocrit: 38.8 % (ref 34.0–46.6)
Hemoglobin: 12.5 g/dL (ref 11.1–15.9)
MCH: 29 pg (ref 26.6–33.0)
MCHC: 32.2 g/dL (ref 31.5–35.7)
MCV: 90 fL (ref 79–97)
Platelets: 252 10*3/uL (ref 150–379)
RBC: 4.31 x10E6/uL (ref 3.77–5.28)
RDW: 14.7 % (ref 12.3–15.4)
WBC: 7.5 10*3/uL (ref 3.4–10.8)

## 2015-07-06 LAB — MICROALBUMIN, URINE WAIVED
CREATININE, URINE WAIVED: 100 mg/dL (ref 10–300)
Microalb, Ur Waived: 10 mg/L (ref 0–19)
Microalb/Creat Ratio: <30 mg/g (ref <30)

## 2015-07-06 LAB — COMPREHENSIVE METABOLIC PANEL
ALK PHOS: 114 IU/L (ref 39–117)
ALT: 17 IU/L (ref 0–32)
AST: 15 IU/L (ref 0–40)
Albumin/Globulin Ratio: 1.3 (ref 1.1–2.5)
Albumin: 3.9 g/dL (ref 3.5–5.5)
BUN/Creatinine Ratio: 10 (ref 9–23)
BUN: 12 mg/dL (ref 6–24)
CHLORIDE: 102 mmol/L (ref 97–108)
CO2: 22 mmol/L (ref 18–29)
Calcium: 9.4 mg/dL (ref 8.7–10.2)
Creatinine, Ser: 1.17 mg/dL — ABNORMAL HIGH (ref 0.57–1.00)
GFR calc Af Amer: 61 mL/min/{1.73_m2} (ref >59)
GFR, EST NON AFRICAN AMERICAN: 53 mL/min/{1.73_m2} — AB (ref >59)
GLOBULIN, TOTAL: 2.9 g/dL (ref 1.5–4.5)
Glucose: 98 mg/dL (ref 65–99)
POTASSIUM: 4.6 mmol/L (ref 3.5–5.2)
SODIUM: 140 mmol/L (ref 134–144)
TOTAL PROTEIN: 6.8 g/dL (ref 6.0–8.5)

## 2015-07-06 LAB — URIC ACID: Uric Acid: 6.2 mg/dL (ref 2.5–7.1)

## 2015-07-06 LAB — LIPID PANEL W/O CHOL/HDL RATIO
Cholesterol, Total: 252 mg/dL — ABNORMAL HIGH (ref 100–199)
HDL: 40 mg/dL (ref >39)
TRIGLYCERIDES: 501 mg/dL — AB (ref 0–149)

## 2015-07-18 ENCOUNTER — Emergency Department
Admission: EM | Admit: 2015-07-18 | Discharge: 2015-07-18 | Disposition: A | Discharge status: Home / Self Care | Payer: Medicare Other | Attending: Emergency Medicine | Admitting: Emergency Medicine

## 2015-07-18 ENCOUNTER — Encounter: Payer: Self-pay | Admitting: Emergency Medicine

## 2015-07-18 DIAGNOSIS — R51 Headache: Secondary | ICD-10-CM | POA: Diagnosis present

## 2015-07-18 DIAGNOSIS — R519 Headache, unspecified: Secondary | ICD-10-CM

## 2015-07-18 DIAGNOSIS — F79 Unspecified intellectual disabilities: Secondary | ICD-10-CM | POA: Insufficient documentation

## 2015-07-18 LAB — COMPREHENSIVE METABOLIC PANEL
ALT: 21 U/L (ref 14–54)
ANION GAP: 11 (ref 5–15)
AST: 30 U/L (ref 15–41)
Albumin: 3.5 g/dL (ref 3.5–5.0)
Alkaline Phosphatase: 95 U/L (ref 38–126)
BUN: 12 mg/dL (ref 6–20)
CHLORIDE: 106 mmol/L (ref 101–111)
CO2: 24 mmol/L (ref 22–32)
CREATININE: 1.49 mg/dL — AB (ref 0.44–1.00)
Calcium: 10 mg/dL (ref 8.9–10.3)
GFR, EST AFRICAN AMERICAN: 45 mL/min — AB (ref >60)
GFR, EST NON AFRICAN AMERICAN: 39 mL/min — AB (ref >60)
Glucose, Bld: 97 mg/dL (ref 65–99)
POTASSIUM: 3.9 mmol/L (ref 3.5–5.1)
SODIUM: 141 mmol/L (ref 135–145)
Total Bilirubin: 0.3 mg/dL (ref 0.3–1.2)
Total Protein: 7.9 g/dL (ref 6.5–8.1)

## 2015-07-18 LAB — CBC WITH DIFFERENTIAL/PLATELET
Basophils Absolute: 0 10*3/uL (ref 0–0.1)
Basophils Relative: 0 %
EOS ABS: 0.1 10*3/uL (ref 0–0.7)
EOS PCT: 1 %
HCT: 38.8 % (ref 35.0–47.0)
Hemoglobin: 12.6 g/dL (ref 12.0–16.0)
LYMPHS ABS: 1.9 10*3/uL (ref 1.0–3.6)
LYMPHS PCT: 18 %
MCH: 29.4 pg (ref 26.0–34.0)
MCHC: 32.6 g/dL (ref 32.0–36.0)
MCV: 90.1 fL (ref 80.0–100.0)
MONO ABS: 0.3 10*3/uL (ref 0.2–0.9)
Monocytes Relative: 3 %
Neutro Abs: 8.1 10*3/uL — ABNORMAL HIGH (ref 1.4–6.5)
Neutrophils Relative %: 78 %
PLATELETS: 268 10*3/uL (ref 150–440)
RBC: 4.31 MIL/uL (ref 3.80–5.20)
RDW: 14.4 % (ref 11.5–14.5)
WBC: 10.4 10*3/uL (ref 3.6–11.0)

## 2015-07-18 MED ORDER — METOCLOPRAMIDE HCL 5 MG/ML IJ SOLN
10.0000 mg | Freq: Once | INTRAMUSCULAR | Status: AC
  Administered 2015-07-18: 10 mg via INTRAVENOUS
  Filled 2015-07-18: qty 2

## 2015-07-18 MED ORDER — DIPHENHYDRAMINE HCL 50 MG/ML IJ SOLN
25.0000 mg | Freq: Once | INTRAMUSCULAR | Status: AC
Start: 2015-07-18 — End: 2015-07-18
  Administered 2015-07-18: 25 mg via INTRAVENOUS
  Filled 2015-07-18: qty 1

## 2015-07-18 MED ORDER — SODIUM CHLORIDE 0.9 % IV SOLN
Freq: Once | INTRAVENOUS | Status: AC
  Administered 2015-07-18: 18:00:00 via INTRAVENOUS

## 2015-07-18 MED ORDER — KETOROLAC TROMETHAMINE 30 MG/ML IJ SOLN
30.0000 mg | Freq: Once | INTRAMUSCULAR | Status: AC
  Administered 2015-07-18: 30 mg via INTRAVENOUS
  Filled 2015-07-18: qty 1

## 2015-07-18 NOTE — ED Provider Notes (Signed)
[facility] Emergency Department Provider Note     Time seen: ----------------------------------------- 5:10 PM on 07/18/2015 -----------------------------------------    I have reviewed the triage vital signs and the nursing notes.   HISTORY  Chief Complaint No chief complaint on file.    HPI Rita [name] is a 54 y.o. female who presents ER for headache. Patient was at [facility] and continued to have a headache during the day, worsened while she was on the bus. Patient is MR, presents with grunting noises and stating it hurts to open her eyes. Patient states she's had a history of headaches before.   Past Medical History  Diagnosis Date  . CKD (chronic kidney disease) stage 3, GFR 30-59 ml/min   . GERD (gastroesophageal reflux disease)   . Factitious disorder   . Intellectual disability due to developmental disorder, unspecified   . Migraine   . Depression   . Anxiety   . Hyperlipidemia   . Chronic tension headaches   . Obesity   . Chronic constipation     Patient Active Problem List   Diagnosis Date Noted  . Chronic kidney disease, stage 3 07/05/2015  . CKD (chronic kidney disease) stage 3, GFR 30-59 ml/min   . GERD (gastroesophageal reflux disease)   . Factitious disorder   . Intellectual disability due to developmental disorder, unspecified   . Migraine   . Depression   . Hyperlipidemia   . Chronic tension headaches   . Obesity   . Chronic constipation   . Major depressive disorder, recurrent episode, in partial remission 06/28/2015  . Anxiety state 06/28/2015  . Intellectual delay 06/28/2015    Past Surgical History  Procedure Laterality Date  . US echocardiography  2012    showed mild heart failure (LVEF 52%); small pericardial effusion  . Carpal tunnel release      Allergies Review of patient's allergies indicates not on file.  Social History Social History  Substance Use Topics  . Smoking status: Never Smoker   .  Smokeless tobacco: Never Used  . Alcohol Use: No    Review of Systems Constitutional: Negative for fever. Eyes: Negative for visual changes. ENT: Negative for sore throat. Cardiovascular: Negative for chest pain. Respiratory: Negative for shortness of breath. Gastrointestinal: Negative for abdominal pain, vomiting and diarrhea. Genitourinary: Negative for dysuria. Musculoskeletal: Negative for back pain. Skin: Negative for rash. Neurological: Positive for headache  10-point ROS otherwise negative.  ____________________________________________   PHYSICAL EXAM:  VITAL SIGNS: ED Triage Vitals  Enc Vitals Group     BP --      Pulse --      Resp --      Temp --      Temp src --      SpO2 --      Weight --      Height --      Head Cir --      Peak Flow --      Pain Score --      Pain Loc --      Pain Edu? --      Excl. in [place]? --     Constitutional: Alert and oriented. Mild to moderate distress. Eyes: Conjunctivae are normal. PERRL. Normal extraocular movements. ENT   Head: Normocephalic and atraumatic.   Nose: No congestion/rhinnorhea.   Mouth/Throat: Mucous membranes are moist.   Neck: No stridor. Cardiovascular: Normal rate, regular rhythm. Normal and symmetric distal pulses are present in all extremities. No murmurs, rubs,  or gallops. Respiratory: Normal respiratory effort without tachypnea nor retractions. Breath sounds are clear and equal bilaterally. No wheezes/rales/rhonchi. Gastrointestinal: Soft and nontender. No distention. No abdominal bruits.  Musculoskeletal: Nontender with normal range of motion in all extremities. No joint effusions.  No lower extremity tenderness nor edema. Neurologic:  Normal speech and language. No gross focal neurologic deficits are appreciated. Speech is normal. No gait instability. Skin:  Skin is warm, dry and intact. No rash noted.  ____________________________________________  ED COURSE:  Pertinent labs &  imaging results that were available during my care of the patient were reviewed by me and considered in my medical decision making (see chart for details). Patient will receive IV Reglan, Toradol, Benadryl. We'll also obtain CT had. ____________________________________________    LABS (pertinent positives/negatives)  Labs Reviewed  CBC WITH DIFFERENTIAL/PLATELET - Abnormal; Notable for the following:    Neutro Abs 8.1 (*)    All other components within normal limits  COMPREHENSIVE METABOLIC PANEL - Abnormal; Notable for the following:    Creatinine, Ser 1.49 (*)    GFR calc non Af Amer 39 (*)    GFR calc Af Amer 45 (*)    All other components within normal limits  ____________________________________________  FINAL ASSESSMENT AND PLAN  Headache   Plan: Patient with labs and imaging as dictated above. Patient is in no acute distress, states he feels better. Family and caretaker notes she has a history of factitious disorder. She is stable for outpatient follow-up   Earleen Newport, MD   Earleen Newport, MD 07/18/15 [id number]

## 2015-07-18 NOTE — Discharge Instructions (Signed)

## 2015-07-18 NOTE — ED Notes (Signed)
Pt arrived via EMS after being on the [employer] bus. Pt was coming from [facility] where she is during the day. She had a headache while at [facility] and continued to have a headache while on the bus.  Pt responds to yes to most questions.  Grunting noises heard and won't open her eyes. Pt also c/o tingling and has a history of migraines. It was noticed by witnesses that she was breathing fast as well.

## 2015-07-20 ENCOUNTER — Emergency Department
Admission: EM | Admit: 2015-07-20 | Discharge: 2015-07-20 | Disposition: A | Discharge status: Home / Self Care | Payer: Medicare Other | Attending: Emergency Medicine | Admitting: Emergency Medicine

## 2015-07-20 ENCOUNTER — Encounter: Payer: Self-pay | Admitting: Emergency Medicine

## 2015-07-20 DIAGNOSIS — R51 Headache: Secondary | ICD-10-CM | POA: Insufficient documentation

## 2015-07-20 DIAGNOSIS — Z79899 Other long term (current) drug therapy: Secondary | ICD-10-CM | POA: Diagnosis not present

## 2015-07-20 DIAGNOSIS — R519 Headache, unspecified: Secondary | ICD-10-CM

## 2015-07-20 MED ORDER — ACETAMINOPHEN 325 MG PO TABS
650.0000 mg | ORAL_TABLET | Freq: Once | ORAL | Status: AC
  Administered 2015-07-20: 650 mg via ORAL
  Filled 2015-07-20: qty 2

## 2015-07-20 NOTE — ED Provider Notes (Signed)
[facility] Emergency Department Provider Note    ____________________________________________  Time seen: On EMS arrival  I have reviewed the triage vital signs and the nursing notes.   HISTORY  Chief Complaint Headache   History limited by: mental retardation. Some history obtained by EMS   HPI Rita [name] is a 54 y.o. female who presents today via EMS because of concerns for headache. Per EMS the patient was riding a transit bus when she started complaining of headache and acting abnormally. They state that this has happened before. The patient herself states she is having a headache the front part of her head. She states it is worse with light. There is severe. She states that she does have a history of headaches.    Past Medical History  Diagnosis Date  . CKD (chronic kidney disease) stage 3, GFR 30-59 ml/min   . GERD (gastroesophageal reflux disease)   . Factitious disorder   . Intellectual disability due to developmental disorder, unspecified   . Migraine   . Depression   . Anxiety   . Hyperlipidemia   . Chronic tension headaches   . Obesity   . Chronic constipation     Patient Active Problem List   Diagnosis Date Noted  . Chronic kidney disease, stage 3 07/05/2015  . CKD (chronic kidney disease) stage 3, GFR 30-59 ml/min   . GERD (gastroesophageal reflux disease)   . Factitious disorder   . Intellectual disability due to developmental disorder, unspecified   . Migraine   . Depression   . Hyperlipidemia   . Chronic tension headaches   . Obesity   . Chronic constipation   . Major depressive disorder, recurrent episode, in partial remission 06/28/2015  . Anxiety state 06/28/2015  . Intellectual delay 06/28/2015    Past Surgical History  Procedure Laterality Date  . US echocardiography  2012    showed mild heart failure (LVEF 52%); small pericardial effusion  . Carpal tunnel release      Current Outpatient Rx  Name  Route   Sig  Dispense  Refill  . clonazePAM (KLONOPIN) 0.5 MG tablet   Oral   Take 0.5 mg by mouth 3 (three) times daily as needed for anxiety.         . docusate sodium (COLACE) 100 MG capsule   Oral   Take 1 capsule (100 mg total) by mouth daily as needed for mild constipation.   30 capsule   12   . DULoxetine (CYMBALTA) 30 MG capsule   Oral   Take 30 mg by mouth daily. Pt takes with a 60mg  capsule.         . DULoxetine (CYMBALTA) 60 MG capsule   Oral   Take 60 mg by mouth daily. Pt takes with a 30mg  capsule.         . magnesium oxide (MAG-OX) 400 MG tablet   Oral   Take 1 tablet (400 mg total) by mouth daily.   90 tablet   3   . Multiple Vitamins-Minerals (MULTIVITAMIN) tablet   Oral   Take 1 tablet by mouth daily.   90 tablet   3   . nortriptyline (PAMELOR) 50 MG capsule   Oral   Take 50 mg by mouth at bedtime.         Marland Kitchen QUEtiapine (SEROQUEL) 100 MG tablet   Oral   Take 100 mg by mouth at bedtime.         . ranitidine (ZANTAC) 150 MG  tablet   Oral   Take 1 tablet (150 mg total) by mouth as needed. Patient taking differently: Take 150 mg by mouth as needed for heartburn.    180 tablet   3   . simvastatin (ZOCOR) 20 MG tablet   Oral   Take 1 tablet (20 mg total) by mouth at bedtime.   90 tablet   3   . SUMAtriptan (IMITREX) 6 MG/0.5ML SOLN injection   Subcutaneous   Inject 6 mg into the skin every 2 (two) hours as needed for migraine or headache. May repeat in 2 hours if headache persists or recurs.         . topiramate (TOPAMAX) 100 MG tablet   Oral   Take 100 mg by mouth 2 (two) times daily.         . traZODone (DESYREL) 50 MG tablet   Oral   Take 50 mg by mouth at bedtime.         . Vitamin D, Cholecalciferol, 1000 UNITS CAPS   Oral   Take 1,000 Units by mouth daily.   90 capsule   3     Allergies Advil  Family History  Problem Relation Age of Onset  . Heart disease Father   . Hyperlipidemia Father     Social  History Social History  Substance Use Topics  . Smoking status: Never Smoker   . Smokeless tobacco: Never Used  . Alcohol Use: No    Review of Systems  Constitutional: Negative for fever. Cardiovascular: Negative for chest pain. Respiratory: Negative for shortness of breath. Gastrointestinal: Negative for abdominal pain, vomiting and diarrhea. Genitourinary: Negative for dysuria. Musculoskeletal: Negative for back pain. Skin: Negative for rash. Neurological: Positive for headache   10-point ROS otherwise negative.  ____________________________________________   PHYSICAL EXAM:  VITAL SIGNS: ED Triage Vitals  Enc Vitals Group     BP 07/20/15 1711 148/95 mmHg     Pulse Rate 07/20/15 1718 99     Resp 07/20/15 1718 14     Temp 07/20/15 1718 98.1 F (36.7 C)     Temp Source 07/20/15 1718 Oral     SpO2 07/20/15 1718 100 %     Weight --      Height --      Head Cir --      Peak Flow --      Pain Score 07/20/15 1719 10   Constitutional: Alert and oriented. Appears in mild distress. Holding her eyes shut. Eyes: Conjunctivae are normal. PERRL. Normal extraocular movements. ENT   Head: Normocephalic and atraumatic.   Nose: No congestion/rhinnorhea.   Mouth/Throat: Mucous membranes are moist.   Neck: No stridor. Hematological/Lymphatic/Immunilogical: No cervical lymphadenopathy. Cardiovascular: Normal rate, regular rhythm.  No murmurs, rubs, or gallops. Respiratory: Normal respiratory effort without tachypnea nor retractions. Breath sounds are clear and equal bilaterally. No wheezes/rales/rhonchi. Gastrointestinal: Soft and nontender. No distention.  Genitourinary: Deferred Musculoskeletal: Normal range of motion in all extremities. No joint effusions.  No lower extremity tenderness nor edema. Neurologic:  Normal speech and language. No gross focal neurologic deficits are appreciated. Speech is normal.  Skin:  Skin is warm, dry and intact. No rash  noted. Psychiatric: Mood and affect are normal. Speech and behavior are normal. Patient exhibits appropriate insight and judgment.  ____________________________________________    LABS (pertinent positives/negatives)  None  ____________________________________________   EKG  None  ____________________________________________    RADIOLOGY  None  ____________________________________________   PROCEDURES  Procedure(s) performed: None  Critical Care  performed: No  ____________________________________________   INITIAL IMPRESSION / ASSESSMENT AND PLAN / ED COURSE  Pertinent labs & imaging results that were available during my care of the patient were reviewed by me and considered in my medical decision making (see chart for details).  Patient presented to the emergency department today because of concerns for headache. Patient was seen in the emergency department 2 days ago for similar symptoms. On exam patient appears to be in mild discomfort. Will plan on giving pain medication and reevaluation. ----------------------------------------- 5:28 PM on 07/20/2015 -----------------------------------------  I just got off the phone with the patient's father. Patient's father states that the patient does this primarily for attention. He states that she has had a significant headache workup including MRI of the brain. He states that they have talked to her about not using this however she will continue to do so. He requests that she be discharged back to the group home and states that her caregivers on their way. ____________________________________________   FINAL CLINICAL IMPRESSION(S) / ED DIAGNOSES  Final diagnoses:  Headache, unspecified headache type     Nance Pear, MD 07/20/15 1757

## 2015-07-20 NOTE — ED Notes (Signed)
Pt presents to ED via EMS with c/o headache with sensitivity to light. Pt alerts and oriented x3.

## 2015-07-20 NOTE — Discharge Instructions (Signed)
Please seek medical attention for any high fevers, chest pain, shortness of breath, change in behavior, persistent vomiting, bloody stool or any other new or concerning symptoms. ° °Headaches, Frequently Asked Questions °MIGRAINE HEADACHES °Q: What is migraine? What causes it? How can I treat it? °A: Generally, migraine headaches begin as a dull ache. Then they develop into a constant, throbbing, and pulsating pain. You may experience pain at the temples. You may experience pain at the front or back of one or both sides of the head. The pain is usually accompanied by a combination of: °· Nausea. °· Vomiting. °· Sensitivity to light and noise. °Some people (about 15%) experience an aura (see below) before an attack. The cause of migraine is believed to be chemical reactions in the brain. Treatment for migraine may include over-the-counter or prescription medications. It may also include self-help techniques. These include relaxation training and biofeedback.  °Q: What is an aura? °A: About 15% of people with migraine get an "aura". This is a sign of neurological symptoms that occur before a migraine headache. You may see wavy or jagged lines, dots, or flashing lights. You might experience tunnel vision or blind spots in one or both eyes. The aura can include visual or auditory hallucinations (something imagined). It may include disruptions in smell (such as strange odors), taste or touch. Other symptoms include: °· Numbness. °· A "pins and needles" sensation. °· Difficulty in recalling or speaking the correct word. °These neurological events may last as long as 60 minutes. These symptoms will fade as the headache begins. °Q: What is a trigger? °A: Certain physical or environmental factors can lead to or "trigger" a migraine. These include: °· Foods. °· Hormonal changes. °· Weather. °· Stress. °It is important to remember that triggers are different for everyone. To help prevent migraine attacks, you need to figure  out which triggers affect you. Keep a headache diary. This is a good way to track triggers. The diary will help you talk to your healthcare professional about your condition. °Q: Does weather affect migraines? °A: Bright sunshine, hot, humid conditions, and drastic changes in barometric pressure may lead to, or "trigger," a migraine attack in some people. But studies have shown that weather does not act as a trigger for everyone with migraines. °Q: What is the link between migraine and hormones? °A: Hormones start and regulate many of your body's functions. Hormones keep your body in balance within a constantly changing environment. The levels of hormones in your body are unbalanced at times. Examples are during menstruation, pregnancy, or menopause. That can lead to a migraine attack. In fact, about three quarters of all women with migraine report that their attacks are related to the menstrual cycle.  °Q: Is there an increased risk of stroke for migraine sufferers? °A: The likelihood of a migraine attack causing a stroke is very remote. That is not to say that migraine sufferers cannot have a stroke associated with their migraines. In persons under age 40, the most common associated factor for stroke is migraine headache. But over the course of a person's normal life span, the occurrence of migraine headache may actually be associated with a reduced risk of dying from cerebrovascular disease due to stroke.  °Q: What are acute medications for migraine? °A: Acute medications are used to treat the pain of the headache after it has started. Examples over-the-counter medications, NSAIDs, ergots, and triptans.  °Q: What are the triptans? °A: Triptans are the newest class of abortive medications.   They are specifically targeted to treat migraine. Triptans are vasoconstrictors. They moderate some chemical reactions in the brain. The triptans work on receptors in your brain. Triptans help to restore the balance of a  neurotransmitter called serotonin. Fluctuations in levels of serotonin are thought to be a main cause of migraine.  °Q: Are over-the-counter medications for migraine effective? °A: Over-the-counter, or "OTC," medications may be effective in relieving mild to moderate pain and associated symptoms of migraine. But you should see your caregiver before beginning any treatment regimen for migraine.  °Q: What are preventive medications for migraine? °A: Preventive medications for migraine are sometimes referred to as "prophylactic" treatments. They are used to reduce the frequency, severity, and length of migraine attacks. Examples of preventive medications include antiepileptic medications, antidepressants, beta-blockers, calcium channel blockers, and NSAIDs (nonsteroidal anti-inflammatory drugs). °Q: Why are anticonvulsants used to treat migraine? °A: During the past few years, there has been an increased interest in antiepileptic drugs for the prevention of migraine. They are sometimes referred to as "anticonvulsants". Both epilepsy and migraine may be caused by similar reactions in the brain.  °Q: Why are antidepressants used to treat migraine? °A: Antidepressants are typically used to treat people with depression. They may reduce migraine frequency by regulating chemical levels, such as serotonin, in the brain.  °Q: What alternative therapies are used to treat migraine? °A: The term "alternative therapies" is often used to describe treatments considered outside the scope of conventional Western medicine. Examples of alternative therapy include acupuncture, acupressure, and yoga. Another common alternative treatment is herbal therapy. Some herbs are believed to relieve headache pain. Always discuss alternative therapies with your caregiver before proceeding. Some herbal products contain arsenic and other toxins. °TENSION HEADACHES °Q: What is a tension-type headache? What causes it? How can I treat it? °A:  Tension-type headaches occur randomly. They are often the result of temporary stress, anxiety, fatigue, or anger. Symptoms include soreness in your temples, a tightening band-like sensation around your head (a "vice-like" ache). Symptoms can also include a pulling feeling, pressure sensations, and contracting head and neck muscles. The headache begins in your forehead, temples, or the back of your head and neck. Treatment for tension-type headache may include over-the-counter or prescription medications. Treatment may also include self-help techniques such as relaxation training and biofeedback. °CLUSTER HEADACHES °Q: What is a cluster headache? What causes it? How can I treat it? °A: Cluster headache gets its name because the attacks come in groups. The pain arrives with little, if any, warning. It is usually on one side of the head. A tearing or bloodshot eye and a runny nose on the same side of the headache may also accompany the pain. Cluster headaches are believed to be caused by chemical reactions in the brain. They have been described as the most severe and intense of any headache type. Treatment for cluster headache includes prescription medication and oxygen. °SINUS HEADACHES °Q: What is a sinus headache? What causes it? How can I treat it? °A: When a cavity in the bones of the face and skull (a sinus) becomes inflamed, the inflammation will cause localized pain. This condition is usually the result of an allergic reaction, a tumor, or an infection. If your headache is caused by a sinus blockage, such as an infection, you will probably have a fever. An x-ray will confirm a sinus blockage. Your caregiver's treatment might include antibiotics for the infection, as well as antihistamines or decongestants.  °REBOUND HEADACHES °Q: What is a rebound   headache? What causes it? How can I treat it? °A: A pattern of taking acute headache medications too often can lead to a condition known as "rebound headache." A  pattern of taking too much headache medication includes taking it more than 2 days per week or in excessive amounts. That means more than the label or a caregiver advises. With rebound headaches, your medications not only stop relieving pain, they actually begin to cause headaches. Doctors treat rebound headache by tapering the medication that is being overused. Sometimes your caregiver will gradually substitute a different type of treatment or medication. Stopping may be a challenge. Regularly overusing a medication increases the potential for serious side effects. Consult a caregiver if you regularly use headache medications more than 2 days per week or more than the label advises. °ADDITIONAL QUESTIONS AND ANSWERS °Q: What is biofeedback? °A: Biofeedback is a self-help treatment. Biofeedback uses special equipment to monitor your body's involuntary physical responses. Biofeedback monitors: °· Breathing. °· Pulse. °· Heart rate. °· Temperature. °· Muscle tension. °· Brain activity. °Biofeedback helps you refine and perfect your relaxation exercises. You learn to control the physical responses that are related to stress. Once the technique has been mastered, you do not need the equipment any more. °Q: Are headaches hereditary? °A: Four out of five (80%) of people that suffer report a family history of migraine. Scientists are not sure if this is genetic or a family predisposition. Despite the uncertainty, a child has a 50% chance of having migraine if one parent suffers. The child has a 75% chance if both parents suffer.  °Q: Can children get headaches? °A: By the time they reach high school, most young people have experienced some type of headache. Many safe and effective approaches or medications can prevent a headache from occurring or stop it after it has begun.  °Q: What type of doctor should I see to diagnose and treat my headache? °A: Start with your primary caregiver. Discuss his or her experience and  approach to headaches. Discuss methods of classification, diagnosis, and treatment. Your caregiver may decide to recommend you to a headache specialist, depending upon your symptoms or other physical conditions. Having diabetes, allergies, etc., may require a more comprehensive and inclusive approach to your headache. The National Head[employer] will provide, upon request, a list of NHF physician members in your state. °Document Released: 02/08/2004 Document Revised: 02/10/2012 Document Reviewed: 07/18/2008 °ExitCare® Patient Information ©2015 [employer]. This information is not intended to replace advice given to you by your health care provider. Make sure you discuss any questions you have with your health care provider. ° °

## 2015-07-21 ENCOUNTER — Telehealth: Payer: Self-pay | Admitting: Licensed Clinical Social Worker

## 2015-07-21 NOTE — Telephone Encounter (Signed)
Support provided to [facility] and her care giver, Fabienne Bruns what happened since client called into clinic tearful and reporting an over-dose of Tylenol.  [name] denied this as true but did confirm that Chaz has gone to ER twice this week due to migraines.  Daila reported to LCSW "Im mad at my dad. He told me I was going to go to jail."  Essentially client had bus drivers that take her to school to call 911 both times this week.  Client's father and care-giver informed Naomy as did medical personnel that her tests are normal and nothing is wrong with her.  Deaisha was told of potential consequences of her behaviors/choices. Client with a history of impulsivity. LCSW assessed suicide risks given client's history.  She indicated to LCSW that she wanted to come to the psychiatric unit at the hospital and admits that her intent was to kill self.  Client's care-giver, Jeannene Patella was notified of this by LCSW during phone conversation that followed conversation with [name].  [name] has all medications locked up and there are no fire arms in the home.  She does not feel that Hiral is of risk to harm herself and situations this week deemed to be attention seeking behaviors which are a part of client's history.  Family concur per Jeannene Patella.  PLAN:  Irmalee to see LCSW on 07/25/15, at 10:00 a.m.Marland Kitchen  LCSW to further assess client's coping and work with client and care-givers to develop behavioral interventions.

## 2015-07-25 ENCOUNTER — Ambulatory Visit (INDEPENDENT_AMBULATORY_CARE_PROVIDER_SITE_OTHER): Payer: Medicare Other | Admitting: Licensed Clinical Social Worker

## 2015-07-25 DIAGNOSIS — F819 Developmental disorder of scholastic skills, unspecified: Secondary | ICD-10-CM

## 2015-07-25 DIAGNOSIS — F411 Generalized anxiety disorder: Secondary | ICD-10-CM

## 2015-07-25 DIAGNOSIS — F3341 Major depressive disorder, recurrent, in partial remission: Secondary | ICD-10-CM | POA: Diagnosis not present

## 2015-07-25 NOTE — Progress Notes (Signed)
THERAPIST PROGRESS NOTE  Session Time: 10:00 a.m. - 11:30 a.m.  Participation Level: Active  Behavioral Response: NeatAlertAnxious, Depressed and Hopeless  Type of Therapy: Individual Therapy  Treatment Goals addressed: Communication: effectively expressing emotions and Coping with negative emotions and decrease in inappropriate behaviors that result in negative consequences for client  Interventions: Solution Focused, Supportive and Social Skills Training  Summary: Rita [name] is a 54 y.o. female who presents with worsening symptoms of depression and SI per her report.  Rita [name]'s moods shifted back and forth during session from tearful episodes and down trodden mood to laughter and joking with LCSW and staff present.  She was focused on being admitted to [facility] here at [facility] and also voiced need for oxygen.  Client's care-home care-giver and Case Worker were present.  Their report was that client had been doing well up until two incidents last week were she went to the ER with a headache and then report of an overdose.  Client was actually seen and assessed in the [facility] ER unit and not determined to be a threat to herself and released into the care of Rita [name].  Rita [name] various symptoms with LCSW including the beginning of "bad dreams" including tornados and one about someone trying to chop her up and worsening headaches along with thoughts that she wanted to hurt herself.  She did repeat back what LCSW recommended and voiced understanding of the plan moving forward.  Client admitted during session that going into the [name] is her way of "getting back" at her dad who recently made her angry after fussing at her and also per client "He told me that I was going to get taken to jail if I kept going to the emergency room."  She denied problems at school but did share that there is a man in her care-home who is on oxygen and taking up time with Rita [name].  Per staff present, Rita [name]'s parents instructed  that Rita [name] stay out of her extra curricular activities citing that if she does not feel well that she does not need to be participating, rather resting.    Rita [name] is in agreement with plan as discussed today and voiced understanding of information [name] to her by LCSW regarding payment of medical care.  Suicidal/Homicidal: Yeswithout intent/plan Client's medications are locked up with only care-home staff with access to this.  No firearms are in the home. Client is not allowed to keep medications with her.  Therapist Response: Offered client and staff support and validated Rita [name]'s feelings and the stress of conflict with her father.  Explained to client the consequences of using medical services inappropriately along with the protocol for admission to inpatient [facility].  Gently reminded that if the [facility] staff who assessed her on Thursday, 07/20/15, were to have felt she was in crisis that they would have moved her from the ER to the [name].  Reinforced the risks client runs of being billed either by Medicare or the [name] for medical care/services that are not seen as appropriate.  Discussed need of helping client to understand and express her feelings and triggers for these feelings in more effective and healthier ways.  Had CMA check client's Oxygen Saturation level which was 98%.  LCSW educated client to the requirements for Medicare/Medicaid to pay for oxygen as below 87 or 88% and risks of using oxygen when her lungs are healthy.  Informed staff and Rita [name] that if current behaviors continue to occur that part of client's plan will  need to be that she is referred to a therapist who's training is specifically in behavioral modification since this is not LCSW's area of specialty.  Plan: Return again in 2-3 weeks or PRN.  Rita [name] agreed to not call her father without first checking in with staff and also checking in after the phone conversation.  She is to not go to [facility] with her father for at least two weeks to see  if her moods and headaches improve.  Rita [name] will follow directions provided to her by ER doctor on managing headaches at home and she will begin using deep breathing and other relaxation skills discussed/taught in therapy and used by client such as use of stress ball and listening to music.  Client's case worker to take her to local pet shelter for pet therapy.  Diagnosis: Major Depressive Disorder, Recurrent, In Partial Remission   Intellectual Delay   Anxiety State   Rita Dibble, LCSW 07/25/2015

## 2015-08-02 ENCOUNTER — Emergency Department
Admission: EM | Admit: 2015-08-02 | Discharge: 2015-08-02 | Disposition: A | Discharge status: Home / Self Care | Payer: Medicare Other

## 2015-08-03 ENCOUNTER — Other Ambulatory Visit: Payer: Self-pay | Admitting: Psychiatry

## 2015-08-07 ENCOUNTER — Other Ambulatory Visit: Payer: Self-pay | Admitting: Psychiatry

## 2015-08-08 ENCOUNTER — Ambulatory Visit (INDEPENDENT_AMBULATORY_CARE_PROVIDER_SITE_OTHER): Payer: Medicare Other | Admitting: Psychiatry

## 2015-08-08 ENCOUNTER — Encounter: Payer: Self-pay | Admitting: Psychiatry

## 2015-08-08 VITALS — BP 118/82 | HR 118 | Temp 98.6°F | Ht 60.0 in | Wt 213.6 lb

## 2015-08-08 DIAGNOSIS — I10 Essential (primary) hypertension: Secondary | ICD-10-CM | POA: Insufficient documentation

## 2015-08-08 DIAGNOSIS — F70 Mild intellectual disabilities: Secondary | ICD-10-CM

## 2015-08-08 DIAGNOSIS — F259 Schizoaffective disorder, unspecified: Secondary | ICD-10-CM

## 2015-08-08 DIAGNOSIS — R51 Headache: Secondary | ICD-10-CM

## 2015-08-08 DIAGNOSIS — R519 Headache, unspecified: Secondary | ICD-10-CM

## 2015-08-08 DIAGNOSIS — G43909 Migraine, unspecified, not intractable, without status migrainosus: Secondary | ICD-10-CM | POA: Insufficient documentation

## 2015-08-08 DIAGNOSIS — F064 Anxiety disorder due to known physiological condition: Secondary | ICD-10-CM | POA: Insufficient documentation

## 2015-08-08 DIAGNOSIS — F258 Other schizoaffective disorders: Secondary | ICD-10-CM

## 2015-08-08 DIAGNOSIS — F250 Schizoaffective disorder, bipolar type: Secondary | ICD-10-CM | POA: Insufficient documentation

## 2015-08-08 DIAGNOSIS — F79 Unspecified intellectual disabilities: Secondary | ICD-10-CM | POA: Insufficient documentation

## 2015-08-08 MED ORDER — DULOXETINE HCL 30 MG PO CPEP: 30.0000 mg | ORAL_CAPSULE | Freq: Every day | ORAL | Status: DC

## 2015-08-08 MED ORDER — CLONAZEPAM 0.5 MG PO TABS: 0.5000 mg | ORAL_TABLET | Freq: Two times a day (BID) | ORAL | Status: DC

## 2015-08-08 MED ORDER — QUETIAPINE FUMARATE 100 MG PO TABS: 100.0000 mg | ORAL_TABLET | Freq: Every day | ORAL | Status: DC

## 2015-08-08 MED ORDER — TRAZODONE HCL 50 MG PO TABS: 50.0000 mg | ORAL_TABLET | Freq: Every day | ORAL | Status: DC

## 2015-08-08 MED ORDER — DULOXETINE HCL 60 MG PO CPEP: 60.0000 mg | ORAL_CAPSULE | Freq: Every day | ORAL | Status: DC

## 2015-08-08 NOTE — Progress Notes (Signed)
[facility] MD Progress Note  08/08/2015 11:46 PM Rita [name]  MRN:  [id number] Subjective:  "I guess I'm all right" information from the patient and the chart and from the group home owner who is here with her. Patient as usual is not a very good historian. She tells me at first that she is all right but later in the interview she begins crying. It is as usual difficult to understand much of what she says. She does report that her headaches continue to bother her. Apparently they have just recently been to the neurologist and there have been some changes planned for treatment of this. In terms of mood the patient has recently been upset in part because she was chastised by her family for calling 911 so often to report her headaches. Patient is not reporting acute suicidal ideation. I'm told that for the most part her mood stays calm and pleasant. No other new problems reported she is not reporting acute psychotic symptoms.2 Principal Problem: @PPROB @ Diagnosis:   Patient Active Problem List   Diagnosis Date Noted  . Schizoaffective disorder, bipolar type [F25.0] 08/08/2015  . Depression, major, recurrent, moderate [F33.1] 08/08/2015  . Depression, major, in partial remission [F32.4] 08/08/2015  . Anxiety disorder due to known physiological condition [F06.4] 08/08/2015  . BP (high blood pressure) [I10] 08/08/2015  . Intellectual disability [F79] 08/08/2015  . Feeble-minded [F70] 08/08/2015  . Headache, migraine [G43.909] 08/08/2015  . Depression, major, recurrent, mild [F33.0] 08/08/2015  . Depression, major, severe recurrence [F33.2] 08/08/2015  . Acute exacerbation of subchronic schizoaffective schizophrenia [F25.8] 08/08/2015  . Chronic kidney disease, stage 3 [N18.3] 07/05/2015  . CKD (chronic kidney disease) stage 3, GFR 30-59 ml/min [N18.3]   . GERD (gastroesophageal reflux disease) [K21.9]   . Factitious disorder [F68.10]   . Intellectual disability due to developmental disorder, unspecified  [F89, F79]   . Migraine [G43.909]   . Depression [F32.9]   . Hyperlipidemia [E78.5]   . Chronic tension headaches [G44.229]   . Obesity [E66.9]   . Chronic constipation [K59.00]   . Major depressive disorder, recurrent episode, in partial remission [F33.41] 06/28/2015  . Anxiety state [F41.1] 06/28/2015  . Intellectual delay [F81.9] 06/28/2015  . Cephalalgia [R51] 06/24/2014   Total Time spent with patient: 30 minutes   Past Medical History:  Past Medical History  Diagnosis Date  . CKD (chronic kidney disease) stage 3, GFR 30-59 ml/min   . GERD (gastroesophageal reflux disease)   . Factitious disorder   . Intellectual disability due to developmental disorder, unspecified   . Migraine   . Depression   . Anxiety   . Hyperlipidemia   . Chronic tension headaches   . Obesity   . Chronic constipation     Past Surgical History  Procedure Laterality Date  . US echocardiography  2012    showed mild heart failure (LVEF 52%); small pericardial effusion  . Carpal tunnel release     Family History:  Family History  Problem Relation Age of Onset  . Heart disease Father   . Hyperlipidemia Father    Social History:  History  Alcohol Use No     History  Drug Use No    Social History   Social History  . Marital Status: Single    Spouse Name: N/A  . Number of Children: N/A  . Years of Education: N/A   Social History Main Topics  . Smoking status: Never Smoker   . Smokeless tobacco: Never Used  . Alcohol  Use: No  . Drug Use: No  . Sexual Activity: No   Other Topics Concern  . None   Social History Narrative   Additional History:    Sleep: Fair  Appetite:  Fair   Assessment: this is a 54 year old woman with chronic developmental disability with a mild degree or moderate degree of chronic intellectual impairment as well as a diagnosis of schizoaffective disorder. Also suffers from chronic headaches. Overall she has been functioning pretty well since moving into a  group home. Appears to be stable and tolerating current medication well.  Musculoskeletal: Strength & Muscle Tone: within normal limits Gait & Station: normal Patient leans: N/A   Psychiatric Specialty Exam: Physical Exam  ROS  Blood pressure 118/82, pulse 118, temperature 98.6 F (37 C), temperature source Tympanic, height 5' (1.524 m), weight 213 lb 9.6 oz (96.888 kg), SpO2 94 %.Body mass index is 41.72 kg/(m^2).  General Appearance: Disheveled  Eye Contact::  Minimal  Speech:  Garbled  Volume:  Decreased  Mood:  Euthymic  Affect:  Inappropriate  Thought Process:  Disorganized  Orientation:  Full (Time, Place, and Person)  Thought Content:  Negative  Suicidal Thoughts:  No  Homicidal Thoughts:  No  Memory:  Immediate;   Fair Recent;   Fair Remote;   Fair  Judgement:  Impaired  Insight:  Fair and Lacking  Psychomotor Activity:  Normal  Concentration:  Poor  Recall:  Poor  Fund of Knowledge:Poor  Language: Poor  Akathisia:  No  Handed:  Right  AIMS (if indicated):     Assets:  Desire for Improvement Housing Resilience Social Support  ADL's:  Intact  Cognition: Impaired,  Moderate  Sleep:        Current Medications: Current Outpatient Prescriptions  Medication Sig Dispense Refill  . aspirin 81 MG chewable tablet Chew by mouth.    . clonazePAM (KLONOPIN) 0.5 MG tablet Take 1 tablet (0.5 mg total) by mouth 2 (two) times daily. 60 tablet 5  . docusate sodium (COLACE) 100 MG capsule Take 1 capsule (100 mg total) by mouth daily as needed for mild constipation. 30 capsule 12  . DULoxetine (CYMBALTA) 30 MG capsule Take 1 capsule (30 mg total) by mouth at bedtime. 30 capsule 5  . DULoxetine (CYMBALTA) 60 MG capsule Take 1 capsule (60 mg total) by mouth daily. 30 capsule 5  . magnesium oxide (MAG-OX) 400 MG tablet Take 1 tablet (400 mg total) by mouth daily. 90 tablet 3  . Multiple Vitamins-Minerals (MULTIVITAMIN) tablet Take 1 tablet by mouth daily. 90 tablet 3  .  nortriptyline (PAMELOR) 50 MG capsule Take 50 mg by mouth at bedtime.    Marland Kitchen QUEtiapine (SEROQUEL) 100 MG tablet Take 1 tablet (100 mg total) by mouth at bedtime. 30 tablet 5  . ranitidine (ZANTAC) 150 MG tablet Take 1 tablet (150 mg total) by mouth as needed. (Patient taking differently: Take 150 mg by mouth as needed for heartburn. ) 180 tablet 3  . simvastatin (ZOCOR) 20 MG tablet Take 1 tablet (20 mg total) by mouth at bedtime. 90 tablet 3  . SUMAtriptan (IMITREX) 6 MG/0.5ML SOLN injection Inject 6 mg into the skin every 2 (two) hours as needed for migraine or headache. May repeat in 2 hours if headache persists or recurs.    . topiramate (TOPAMAX) 100 MG tablet Take 100 mg by mouth 2 (two) times daily.    . traZODone (DESYREL) 50 MG tablet Take 1 tablet (50 mg total) by mouth at bedtime.  30 tablet 5  . Vitamin D, Cholecalciferol, 1000 UNITS CAPS Take 1,000 Units by mouth daily. 90 capsule 3  . acetaminophen (TYLENOL) 500 MG tablet Take by mouth.    . hydrocortisone cream 1 % Apply topically.     No current facility-administered medications for this visit.    Lab Results: No results found for this or any previous visit (from the past 48 hour(s)).  Physical Findings: AIMS:  , ,  ,  ,    CIWA:    COWS:     Treatment Plan Summary: Medication management and Plan supportive counseling. Reviewed medicines. I note that her neurologist is stopping her Topamax. Also the nortriptyline. We will continue her current Cymbalta and trazodone and Seroquel. Follow-up in 6 months. Call sooner if needed. Patient and group home owner are satisfied.   Medical Decision Making:  Established Problem, Stable/Improving (1), Review of Psycho-Social Stressors (1), Review of Last Therapy Session (1), Review of Medication Regimen & Side Effects (2) and Review of New Medication or Change in Dosage (2)     Sadee Osland 08/08/2015, 11:46 PM

## 2015-08-10 NOTE — Telephone Encounter (Signed)
I find this message confusing. I have no idea what is being requested here.

## 2015-08-18 ENCOUNTER — Telehealth: Payer: Self-pay

## 2015-08-18 NOTE — Telephone Encounter (Signed)
I called, [name]; caregiver HIPAA cleared in [facility] Patient will call doctors to try to get medicine when she gets to a phone She mental health issues; she does not think she is in danger or in any unusual pain

## 2015-08-18 NOTE — Telephone Encounter (Signed)
Patient called and I could hardly understand her. All I could understand was that she was taken off of her imitrex by another provider and is now taking some kind of pill. Patient started crying and I asked her if she was in pain and she stated yes. I am not exactly sure what to do with this.

## 2015-08-24 ENCOUNTER — Telehealth: Payer: Self-pay

## 2015-08-24 DIAGNOSIS — J309 Allergic rhinitis, unspecified: Secondary | ICD-10-CM

## 2015-08-24 NOTE — Telephone Encounter (Signed)
[name], patient's caregiver, called and asked if Rita [name] could put in a referral for the patient to see an allergy specialist. Patient was last seen 08/08/15 and [name]'s call back number is [phone].

## 2015-08-24 NOTE — Telephone Encounter (Signed)
That would be fine. Order placed. 

## 2015-08-25 NOTE — Telephone Encounter (Signed)
Rita [name] called and spoke to the caregiver about the appointment for the allergy specialist.

## 2015-09-03 ENCOUNTER — Other Ambulatory Visit: Payer: Self-pay | Admitting: Unknown Physician Specialty

## 2015-09-11 ENCOUNTER — Ambulatory Visit (INDEPENDENT_AMBULATORY_CARE_PROVIDER_SITE_OTHER): Payer: Medicare Other | Admitting: Licensed Clinical Social Worker

## 2015-09-11 DIAGNOSIS — F3341 Major depressive disorder, recurrent, in partial remission: Secondary | ICD-10-CM | POA: Diagnosis not present

## 2015-09-11 DIAGNOSIS — F411 Generalized anxiety disorder: Secondary | ICD-10-CM | POA: Diagnosis not present

## 2015-09-11 DIAGNOSIS — F70 Mild intellectual disabilities: Secondary | ICD-10-CM | POA: Diagnosis not present

## 2015-09-11 NOTE — Progress Notes (Signed)
THERAPIST PROGRESS NOTE  Session Time: 9:15 a.m. -  10:20 a.m.  Participation Level: Active  Behavioral Response: CasualAlertAnxious and Depressed  Type of Therapy: Individual Therapy  Treatment Goals addressed: Coping  Interventions: Solution Focused, Strength-based and Supportive  Summary: Rita [name] is a 54 y.o. female who returns to [facility] has continued to have problems with headaches and has called 911 at least five times since she was last seen in OPT.  So far there has not been any medical conditions diagnosed and next follow up with an Allergy Specialist.  Rita [name] has not been to [facility] in about three weeks and staff present indicate that they actually see an improvement in her moods/behaviors since not going.  Client is at the minimum number of hours (12) to attend classes and did express an interest in returning to school.   Rita [name] reported that she put a plastic bag over her head but care home staff stopped her. This happened a week ago and Rita [name] indicated "I was getting frustrated with the headaches and I put the plastic bag over my head."  "I was trying to hurt myself. Sometimes I think I would do it again."  She admits to calling to inpatient [facility] who instructed her to call 911 yet Rita [name] stated "I didn't want to call 911 because I didn't want to get in trouble."  She talked about other staff at her group home and feels that she can talk to this staff if she feels bad or wants to hurt herself again.  Client informed LCSW that that she understands that she needs to practice patience and recognized that there are other people in the group home that needs more help than she does.    She enjoys spending time with her parents who are doing okay.  She and her dad were going to practice a game for [employer] but it was rained out. Rita [name] talked about how well her parents and her dog are doing.  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  Staff explained to Rita [name] that if she continues to cal 911 that she will be disqualified for transportation from school back to home and would therefore be limited in her activities without regular transportation.   Plan discussed was that if Rita [name] has a headache then she needs to allow the bus to take her home first and let the care home assess and treat.  Client voiced that she understands the consequences of her choices to call 911 and agreed with the plan discussed.  Additional behavioral intervention is that Rita [name] will listen to her music on the bus and practice her deep breathing.  Appropriate upset was reported by client and staff over news of LCSW's resignation from this clinic.  Care home and Case Worker will assist her with securing OPT elsewhere.  Suicidal/Homicidal: Negativewithout intent/plan  During session she did admit to having thoughts that she wanted to be dead and did have recent situation confirmed by care home staff where she put a plastic bag over her head. Denied thoughts or plan during session.  Therapist Response: Offered client and staff support and validated Rita [name]'s feelings of frustration and sadness related to chronic headaches.  Gently explored other ways to express her uncomfortable feelings besides wanting to harm herself and reinforced use of stress management/relaxation strategies discussed.  Commended client  on her progress overall and offered both encouragement and reminders about the appropriate use of the behavioral health inpatient unit.  Thanked staff for their support of client and of her goals.  Discussed with Rita [name] and staff that LCSW's last day in this clinic will be 10/06/15.  Provided letter with list of additional providers in the area that accept her insurance.  Rita [name] for allowing LCSW to be a support to her over the years and reinforced use of different skills/techniques such as  CBT, stress management, communication and listening skills and expressing of emotions without use of self harm.    Invited client to return at least one more time before LCSW's last day and offered to assist client and staff with referral process PRN.  Plan: Return again in 2 weeks.  Discuss transition to another OPT provider. LCSW will assess client's functional status and behavioral issues and how client and staff are managing these.  Diagnosis: Major Depressive Disorder, Recurrent, In Partial Remission   Intellectual Delay   Anxiety State   Rita Dibble, LCSW 09/11/2015

## 2015-09-19 ENCOUNTER — Ambulatory Visit: Payer: Self-pay | Admitting: Unknown Physician Specialty

## 2015-09-20 ENCOUNTER — Encounter: Payer: Self-pay | Admitting: Unknown Physician Specialty

## 2015-09-20 ENCOUNTER — Ambulatory Visit: Payer: Medicare Other | Admitting: Unknown Physician Specialty

## 2015-09-20 ENCOUNTER — Ambulatory Visit (INDEPENDENT_AMBULATORY_CARE_PROVIDER_SITE_OTHER): Payer: Medicare Other | Admitting: Unknown Physician Specialty

## 2015-09-20 VITALS — BP 135/77 | HR 86 | Temp 99.1°F | Ht 60.3 in | Wt 208.2 lb

## 2015-09-20 DIAGNOSIS — R300 Dysuria: Secondary | ICD-10-CM

## 2015-09-20 DIAGNOSIS — Z23 Encounter for immunization: Secondary | ICD-10-CM

## 2015-09-20 LAB — UA/M W/RFLX CULTURE, ROUTINE
BILIRUBIN UA: NEGATIVE
GLUCOSE, UA: NEGATIVE
LEUKOCYTES UA: NEGATIVE
Nitrite, UA: NEGATIVE
PROTEIN UA: NEGATIVE
Urobilinogen, Ur: 0.2 mg/dL (ref 0.2–1.0)
pH, UA: 5 (ref 5.0–7.5)

## 2015-09-20 LAB — MICROSCOPIC EXAMINATION: Epithelial Cells (non renal): >10 /hpf — AB (ref 0–10)

## 2015-09-20 NOTE — Progress Notes (Signed)
BP 135/77 mmHg  Pulse 86  Temp(Src) 99.1 F (37.3 C)  Ht 5' 0.3" (1.532 m)  Wt 208 lb 3.2 oz (94.439 kg)  BMI 40.24 kg/m2  SpO2 98%   Subjective:    Patient ID: Rita [name], female    DOB: [birth year], 54 y.o.   MRN: [id number]  HPI: Rita [name] is a 54 y.o. female  Chief Complaint  Patient presents with  . Headache    pt states she had a severe headache last week. Pt states she wants to talk about referral to ENT to see if they could help.  Marland Kitchen other    pt states she thinks she may have a kidney infection, states she has some burning and pain with urination   Migraine/Headache This is a chronic problem she has been getting headaches more frequently she is currently taking po Imitrex when she gets a headache.  She occasionally takes Tylenol with relief of symptoms.  She see's Neurology for her headaches.  Burning with Urination The pt presents to the office with c/o burning with urination and urinary frequency onset 2 weeks ago. Pertinent negatives denies fever, chills,  hematuria, pelvic pain, back pain, dysuria, vaginal pain, vaginal discharge, or urgency.    Relevant past medical, surgical, family and social history reviewed and updated as indicated. Interim medical history since our last visit reviewed. Allergies and medications reviewed and updated.  Review of Systems  Constitutional: Negative.   Eyes: Negative.   Respiratory: Negative.   Cardiovascular: Negative.   Gastrointestinal: Negative.   Musculoskeletal: Negative.   Neurological: Positive for dizziness and headaches. Negative for tremors, seizures, syncope, weakness and numbness.  Hematological: Negative.   Psychiatric/Behavioral: Negative.     Per HPI unless specifically indicated above     Objective:    BP 135/77 mmHg  Pulse 86  Temp(Src) 99.1 F (37.3 C)  Ht 5' 0.3" (1.532 m)  Wt 208 lb 3.2 oz (94.439 kg)  BMI 40.24 kg/m2  SpO2 98%  Wt Readings from Last 3 Encounters:  09/20/15 208 lb 3.2  oz (94.439 kg)  08/08/15 213 lb 9.6 oz (96.888 kg)  07/18/15 212 lb (96.163 kg)    Physical Exam  Constitutional: She is oriented to person, place, and time. She appears well-developed and well-nourished. No distress.  HENT:  Head: Normocephalic and atraumatic.  Right Ear: External ear normal.  Left Ear: External ear normal.  Nose: Nose normal.  Neck: Normal range of motion. Neck supple.  Cardiovascular: Normal rate, regular rhythm, normal heart sounds and intact distal pulses.   Pulmonary/Chest: Effort normal and breath sounds normal. No respiratory distress. She has no wheezes. She has no rales. She exhibits no tenderness.  Abdominal: Soft. Bowel sounds are normal. She exhibits no distension and no mass. There is no tenderness. There is no rebound and no guarding.  No costovertebral tenderness   Musculoskeletal: Normal range of motion. She exhibits no edema or tenderness.  Neurological: She is alert and oriented to person, place, and time.  Skin: Skin is warm and dry. No rash noted. She is not diaphoretic. No erythema. No pallor.  Psychiatric: She has a normal mood and affect. Her behavior is normal. Judgment and thought content normal.        Assessment & Plan:   Problem List Items Addressed This Visit    None    Visit Diagnoses    Immunization due    -  Primary    Relevant Orders    Flu  Vaccine QUAD 36+ mos PF IM (Fluarix & Fluzone Quad PF) (Completed)    Burning with urination        Urinalysis results trace bacteria, leukocytes negative, nitrates negative, several epithelial cells Placed order for Ambulatory referreal to Urology    Relevant Orders    UA/M w/rflx Culture, Routine    Ambulatory referral to Urology        Follow up plan: Return if symptoms worsen or fail to improve.

## 2015-09-20 NOTE — Assessment & Plan Note (Signed)
Managed by Neurology

## 2015-09-26 ENCOUNTER — Ambulatory Visit: Payer: Self-pay | Admitting: Urology

## 2015-10-02 ENCOUNTER — Ambulatory Visit (INDEPENDENT_AMBULATORY_CARE_PROVIDER_SITE_OTHER): Payer: Medicare Other | Admitting: Urology

## 2015-10-02 ENCOUNTER — Encounter: Payer: Self-pay | Admitting: Urology

## 2015-10-02 VITALS — BP 136/73 | HR 91 | Ht 60.0 in | Wt 209.7 lb

## 2015-10-02 DIAGNOSIS — R3129 Other microscopic hematuria: Secondary | ICD-10-CM | POA: Diagnosis not present

## 2015-10-02 DIAGNOSIS — R300 Dysuria: Secondary | ICD-10-CM

## 2015-10-02 LAB — MICROSCOPIC EXAMINATION
RBC, UA: NONE SEEN /hpf (ref 0–?)
RENAL EPITHEL UA: NONE SEEN /HPF

## 2015-10-02 LAB — URINALYSIS, COMPLETE
Bilirubin, UA: NEGATIVE
Glucose, UA: NEGATIVE
Ketones, UA: NEGATIVE
Nitrite, UA: NEGATIVE
PH UA: 5 (ref 5.0–7.5)
PROTEIN UA: NEGATIVE
RBC, UA: NEGATIVE
Specific Gravity, UA: 1.02 (ref 1.005–1.030)
Urobilinogen, Ur: 0.2 mg/dL (ref 0.2–1.0)

## 2015-10-02 NOTE — Progress Notes (Signed)
10/02/2015 9:42 AM   [name] 12-30-1960 [id number]  Referring provider: Kathrine Haddock, NP [place], [place] [address]  Chief Complaint  Patient presents with  . Dysuria    referred by Kathrine Haddock @ [facility]  . Hematuria    HPI: Patient is a 54 year old white female with schizoaffective disorder who is referred to [place] by primary care physician for microscopic hematuria.  She presents today with her caregiver she is a limited historian.  She had her caregiver denies any gross hematuria.  There has been intermittent dysuria and on rare occasion urinary incontinence.  Patient has been encouraged to increase her water intake recently and the dysuria has abated.  Urinary incontinence has occurred on 2 occasions with the scant amount of urine leaking.    UA at PCP's office was positive for 3-10 RBC's/hpf on 09/20/2015.  Today's, UA is negative.  There has been no bloody discharge.  She has not had any recent fevers, chills, nausea or vomiting.     PMH: Past Medical History  Diagnosis Date  . CKD (chronic kidney disease) stage 3, GFR 30-59 ml/min   . GERD (gastroesophageal reflux disease)   . Factitious disorder   . Intellectual disability due to developmental disorder, unspecified   . Migraine   . Depression   . Anxiety   . Hyperlipidemia   . Chronic tension headaches   . Obesity   . Chronic constipation     Surgical History: Past Surgical History  Procedure Laterality Date  . US echocardiography  2012    showed mild heart failure (LVEF 52%); small pericardial effusion  . Carpal tunnel release      Home Medications:    Medication List       This list is accurate as of: 10/02/15  9:42 AM.  Always use your most recent med list.               acetaminophen 500 MG tablet  Commonly known as:  TYLENOL  Take by mouth.     ASPIRIN LOW DOSE 81 MG EC tablet  Generic drug:  aspirin  TAKE 1 TABLET BY MOUTH ONCE DAILY.     clonazePAM 0.5 MG tablet  Commonly known  as:  KLONOPIN  Take 1 tablet (0.5 mg total) by mouth 2 (two) times daily.     docusate sodium 100 MG capsule  Commonly known as:  COLACE  Take 1 capsule (100 mg total) by mouth daily as needed for mild constipation.     DULoxetine 30 MG capsule  Commonly known as:  CYMBALTA  Take 1 capsule (30 mg total) by mouth at bedtime.     DULoxetine 60 MG capsule  Commonly known as:  CYMBALTA  Take 1 capsule (60 mg total) by mouth daily.     magnesium oxide 400 MG tablet  Commonly known as:  MAG-OX  Take 1 tablet (400 mg total) by mouth daily.     multivitamin tablet  Take 1 tablet by mouth daily.     QUEtiapine 100 MG tablet  Commonly known as:  SEROQUEL  Take 1 tablet (100 mg total) by mouth at bedtime.     ranitidine 150 MG tablet  Commonly known as:  ZANTAC  Take 1 tablet (150 mg total) by mouth as needed.     simvastatin 20 MG tablet  Commonly known as:  ZOCOR  Take 1 tablet (20 mg total) by mouth at bedtime.     SUMAtriptan 6 MG/0.5ML Soln injection  Commonly known as:  IMITREX  Inject 6 mg into the skin every 2 (two) hours as needed for migraine or headache. May repeat in 2 hours if headache persists or recurs.     topiramate 100 MG tablet  Commonly known as:  TOPAMAX  Take 100 mg by mouth 2 (two) times daily.     traZODone 50 MG tablet  Commonly known as:  DESYREL  Take 1 tablet (50 mg total) by mouth at bedtime.     Vitamin D (Cholecalciferol) 1000 UNITS Caps  Take 1,000 Units by mouth daily.        Allergies:  Allergies  Allergen Reactions  . Advil [Ibuprofen] Other (See Comments)    Reaction:  Gives her kidney problems     Family History: Family History  Problem Relation Age of Onset  . Heart disease Father   . Hyperlipidemia Father   . Kidney disease Father   . Prostate cancer Neg Hx     Social History:  reports that she has never smoked. She has never used smokeless tobacco. She reports that she does not drink alcohol or use illicit  drugs.  ROS: UROLOGY Frequent Urination?: No Hard to postpone urination?: No Burning/pain with urination?: No Get up at night to urinate?: No Leakage of urine?: No Urine stream starts and stops?: No Trouble starting stream?: No Do you have to strain to urinate?: No Blood in urine?: Yes Urinary tract infection?: No Sexually transmitted disease?: No Injury to kidneys or bladder?: No Painful intercourse?: No Weak stream?: No Currently pregnant?: No Vaginal bleeding?: No Last menstrual period?: n  Gastrointestinal Nausea?: No Vomiting?: No Indigestion/heartburn?: Yes Diarrhea?: No Constipation?: Yes  Constitutional Fever: No Night sweats?: No Weight loss?: No Fatigue?: No  Skin Skin rash/lesions?: No Itching?: No  Eyes Blurred vision?: No Double vision?: No  Ears/Nose/Throat Sore throat?: No Sinus problems?: No  Hematologic/Lymphatic Swollen glands?: No Easy bruising?: No  Cardiovascular Leg swelling?: No Chest pain?: No  Respiratory Cough?: No Shortness of breath?: No  Endocrine Excessive thirst?: No  Musculoskeletal Back pain?: No Joint pain?: No  Neurological Headaches?: Yes Dizziness?: No  Psychologic Depression?: Yes Anxiety?: No  Physical Exam: BP 136/73 mmHg  Pulse 91  Ht 5' (1.524 m)  Wt 209 lb 11.2 oz (95.119 kg)  BMI 40.95 kg/m2  LMP  (LMP Unknown)  Constitutional: Well nourished. Alert and oriented, No acute distress. HEENT: [place] AT, moist mucus membranes. Trachea midline, no masses. Cardiovascular: No clubbing, cyanosis, or edema. Respiratory: Normal respiratory effort, no increased work of breathing. GI: Abdomen is soft, non tender, non distended, no abdominal masses. Liver and spleen not palpable.  No hernias appreciated.  Stool sample for occult testing is not indicated.   GU: No CVA tenderness.  No bladder fullness or masses.  Normal external genitalia.  Urethral meatus is patent.  Urethra without masses, scarring or  tenderness.  Bladder not palpable.  Vagina is without discharge.  No prolapse is noted. Cervix is mobile and non-fixed. Uterus and adnexa/parametria are not palpable due to her body habitus.  Anus and perineum is normal.   Skin: No rashes, bruises or suspicious lesions. Lymph: No cervical or inguinal adenopathy. Neurologic: Grossly intact, no focal deficits, moving all 4 extremities. Psychiatric: Normal mood and affect.  Laboratory Data: Lab Results  Component Value Date   WBC 10.4 07/18/2015   HGB 12.6 07/18/2015   HCT 38.8 07/18/2015   MCV 90.1 07/18/2015   PLT 268 07/18/2015    Lab Results  Component Value Date   CREATININE 1.49* 07/18/2015    Urinalysis Results for orders placed or performed in visit on 10/02/15  Microscopic Examination  Result Value Ref Range   WBC, UA 0-5 0 -  5 /hpf   RBC, UA None seen 0 -  2 /hpf   Epithelial Cells (non renal) 0-10 0 - 10 /hpf   Renal Epithel, UA None seen None seen /hpf   Bacteria, UA Moderate (A) None seen/Few  Urinalysis, Complete  Result Value Ref Range   Specific Gravity, UA 1.020 1.005 - 1.030   pH, UA 5.0 5.0 - 7.5   Color, UA Yellow Yellow   Appearance Ur Clear Clear   Leukocytes, UA Trace (A) Negative   Protein, UA Negative Negative/Trace   Glucose, UA Negative Negative   Ketones, UA Negative Negative   RBC, UA Negative Negative   Bilirubin, UA Negative Negative   Urobilinogen, Ur 0.2 0.2 - 1.0 mg/dL   Nitrite, UA Negative Negative   Microscopic Examination See below:      Assessment & Plan:    1. Microscopic hematuria:   Patient was found to have 3-10 RBC's/hpf on an UA performed at her PCP's office on 09/20/2015.  Explained to patient the causes of blood in the urine are as follows: stones, UTI's, damage to the urinary tract and/or cancer.  It is explained to the patient that they will be scheduled for a CT Urogram with contrast material and that in rare instances, an allergic reaction can be serious and even life  threatening with the injection of contrast material.   The patient denies any allergies to contrast, iodine and/or seafood and is not taking metformin.  - Urinalysis, Complete - CULTURE, URINE COMPREHENSIVE -BUN/serum creatinine -hcg, qualitative   Return for CT Urogram report.  Zara Council, [facility] [address], [place], [place] [address] [phone]

## 2015-10-03 LAB — BUN+CREAT
BUN/Creatinine Ratio: 10 (ref 9–23)
BUN: 12 mg/dL (ref 6–24)
Creatinine, Ser: 1.26 mg/dL — ABNORMAL HIGH (ref 0.57–1.00)
GFR calc Af Amer: 56 mL/min/{1.73_m2} — ABNORMAL LOW (ref >59)
GFR, EST NON AFRICAN AMERICAN: 49 mL/min/{1.73_m2} — AB (ref >59)

## 2015-10-03 LAB — HCG, SERUM, QUALITATIVE: HCG, BETA SUBUNIT, QUAL, SERUM: NEGATIVE m[IU]/mL (ref <6)

## 2015-10-04 LAB — CULTURE, URINE COMPREHENSIVE

## 2015-10-10 ENCOUNTER — Ambulatory Visit: Admission: RE | Admit: 2015-10-10 | Payer: Medicare Other | Source: Ambulatory Visit

## 2015-10-10 ENCOUNTER — Ambulatory Visit
Admission: RE | Admit: 2015-10-10 | Discharge: 2015-10-10 | Disposition: A | Discharge status: Home / Self Care | Payer: Medicare Other | Source: Ambulatory Visit | Attending: Urology | Admitting: Urology

## 2015-10-10 DIAGNOSIS — R3129 Other microscopic hematuria: Secondary | ICD-10-CM | POA: Diagnosis not present

## 2015-10-10 MED ORDER — IOHEXOL 300 MG/ML  SOLN
125.0000 mL | Freq: Once | INTRAMUSCULAR | Status: AC | PRN
  Administered 2015-10-10: 125 mL via INTRAVENOUS

## 2015-10-17 ENCOUNTER — Ambulatory Visit: Payer: Self-pay | Admitting: Urology

## 2015-10-19 ENCOUNTER — Encounter: Payer: Self-pay | Admitting: Urology

## 2015-10-19 ENCOUNTER — Ambulatory Visit (INDEPENDENT_AMBULATORY_CARE_PROVIDER_SITE_OTHER): Payer: Medicare Other | Admitting: Urology

## 2015-10-19 VITALS — BP 155/85 | HR 98 | Ht 60.0 in | Wt 209.0 lb

## 2015-10-19 DIAGNOSIS — R3129 Other microscopic hematuria: Secondary | ICD-10-CM

## 2015-10-19 DIAGNOSIS — N289 Disorder of kidney and ureter, unspecified: Secondary | ICD-10-CM | POA: Diagnosis not present

## 2015-10-19 LAB — URINALYSIS, COMPLETE
Bilirubin, UA: NEGATIVE
GLUCOSE, UA: NEGATIVE
Ketones, UA: NEGATIVE
Leukocytes, UA: NEGATIVE
NITRITE UA: NEGATIVE
PH UA: 6 (ref 5.0–7.5)
Protein, UA: NEGATIVE
Specific Gravity, UA: 1.01 (ref 1.005–1.030)
UUROB: 0.2 mg/dL (ref 0.2–1.0)

## 2015-10-19 LAB — MICROSCOPIC EXAMINATION: RBC MICROSCOPIC, UA: NONE SEEN /HPF (ref 0–?)

## 2015-10-19 NOTE — Progress Notes (Signed)
11:30 AM   Rita [name] 17-Feb-1961 [id number]  Referring provider: Kathrine Haddock, NP [place], [place] [address]  Chief Complaint  Patient presents with  . Results    CT    HPI: Patient is a 54 year old white female with schizoaffective disorder who presents today to discuss her CT Urogram results.    Background story Patient is a 54 year old white female with schizoaffective disorder who is referred to [place] by primary care physician for microscopic hematuria.  She presents today with her caregiver she is a limited historian.  Her caregiver denies any gross hematuria.  There has been intermittent dysuria and on rare occasion urinary incontinence.  Patient has been encouraged to increase her water intake recently and the dysuria has abated.  Urinary incontinence has occurred on 2 occasions with the scant amount of urine leaking.   UA at PCP's office was positive for 3-10 RBC's/hpf on 09/20/2015.  There has been no bloody discharge.  She has not had any recent fevers, chills, nausea or vomiting.    Today, patient states she is feeling well.  Her caregiver does not express any concerns.  CT Urogram performed on 10/10/2015 noted a sub centimeter right renal cortical lesion which was too small to characterize.  Her left ureter did not completely opacify on today's exam.  I have reviewed the images with the patient and her caregiver.  Her UA today was negative for microscopic hematuria and she does not endorse any gross hematuria.  PMH: Past Medical History  Diagnosis Date  . CKD (chronic kidney disease) stage 3, GFR 30-59 ml/min   . GERD (gastroesophageal reflux disease)   . Factitious disorder   . Intellectual disability due to developmental disorder, unspecified   . Migraine   . Depression   . Anxiety   . Hyperlipidemia   . Chronic tension headaches   . Obesity   . Chronic constipation   . Microscopic hematuria     Surgical History: Past Surgical History  Procedure  Laterality Date  . US echocardiography  2012    showed mild heart failure (LVEF 52%); small pericardial effusion  . Carpal tunnel release      Home Medications:    Medication List       This list is accurate as of: 10/19/15 11:30 AM.  Always use your most recent med list.               acetaminophen 500 MG tablet  Commonly known as:  TYLENOL  Take by mouth.     ASPIRIN LOW DOSE 81 MG EC tablet  Generic drug:  aspirin  TAKE 1 TABLET BY MOUTH ONCE DAILY.     clonazePAM 0.5 MG tablet  Commonly known as:  KLONOPIN  Take 1 tablet (0.5 mg total) by mouth 2 (two) times daily.     docusate sodium 100 MG capsule  Commonly known as:  COLACE  Take 1 capsule (100 mg total) by mouth daily as needed for mild constipation.     DULoxetine 30 MG capsule  Commonly known as:  CYMBALTA  Take 1 capsule (30 mg total) by mouth at bedtime.     DULoxetine 60 MG capsule  Commonly known as:  CYMBALTA  Take 1 capsule (60 mg total) by mouth daily.     magnesium oxide 400 MG tablet  Commonly known as:  MAG-OX  Take 1 tablet (400 mg total) by mouth daily.     multivitamin tablet  Take 1 tablet  by mouth daily.     QUEtiapine 100 MG tablet  Commonly known as:  SEROQUEL  Take 1 tablet (100 mg total) by mouth at bedtime.     ranitidine 150 MG tablet  Commonly known as:  ZANTAC  Take 1 tablet (150 mg total) by mouth as needed.     simvastatin 20 MG tablet  Commonly known as:  ZOCOR  Take 1 tablet (20 mg total) by mouth at bedtime.     SUMAtriptan 6 MG/0.5ML Soln injection  Commonly known as:  IMITREX  Inject 6 mg into the skin every 2 (two) hours as needed for migraine or headache. May repeat in 2 hours if headache persists or recurs.     topiramate 100 MG tablet  Commonly known as:  TOPAMAX  Take 100 mg by mouth 2 (two) times daily.     traZODone 50 MG tablet  Commonly known as:  DESYREL  Take 1 tablet (50 mg total) by mouth at bedtime.     Vitamin D (Cholecalciferol) 1000 UNITS  Caps  Take 1,000 Units by mouth daily.        Allergies:  Allergies  Allergen Reactions  . Advil [Ibuprofen] Other (See Comments)    Reaction:  Gives her kidney problems     Family History: Family History  Problem Relation Age of Onset  . Heart disease Father   . Hyperlipidemia Father   . Kidney disease Father   . Prostate cancer Neg Hx     Social History:  reports that she has never smoked. She has never used smokeless tobacco. She reports that she does not drink alcohol or use illicit drugs.  ROS: UROLOGY Frequent Urination?: No Hard to postpone urination?: No Burning/pain with urination?: No Get up at night to urinate?: No Leakage of urine?: No Urine stream starts and stops?: No Trouble starting stream?: No Do you have to strain to urinate?: No Blood in urine?: No Urinary tract infection?: No Sexually transmitted disease?: No Injury to kidneys or bladder?: No Painful intercourse?: No Weak stream?: No Currently pregnant?: No Vaginal bleeding?: No Last menstrual period?: n  Gastrointestinal Nausea?: No Vomiting?: No Indigestion/heartburn?: No Diarrhea?: No Constipation?: No  Constitutional Fever: No Night sweats?: No Weight loss?: No Fatigue?: No  Skin Skin rash/lesions?: No Itching?: No  Eyes Blurred vision?: No Double vision?: No  Ears/Nose/Throat Sore throat?: No Sinus problems?: No  Hematologic/Lymphatic Swollen glands?: No Easy bruising?: No  Cardiovascular Leg swelling?: No Chest pain?: No  Respiratory Cough?: No Shortness of breath?: No  Endocrine Excessive thirst?: No  Musculoskeletal Back pain?: No Joint pain?: No  Neurological Headaches?: Yes Dizziness?: No  Psychologic Depression?: Yes Anxiety?: No  Physical Exam: Blood pressure 155/85, pulse 98, height 5' (1.524 m), weight 209 lb (94.802 kg).  Constitutional: Well nourished. Alert and oriented, No acute distress. HEENT: [place] AT, moist mucus membranes.  Trachea midline, no masses. Cardiovascular: No clubbing, cyanosis, or edema. Respiratory: Normal respiratory effort, no increased work of breathing. Skin: No rashes, bruises or suspicious lesions. Lymph: No cervical or inguinal adenopathy. Neurologic: Grossly intact, no focal deficits, moving all 4 extremities. Psychiatric: Normal mood and affect.  Laboratory Data: Lab Results  Component Value Date   WBC 10.4 07/18/2015   HGB 12.6 07/18/2015   HCT 38.8 07/18/2015   MCV 90.1 07/18/2015   PLT 268 07/18/2015    Lab Results  Component Value Date   CREATININE 1.26* 10/02/2015    Urinalysis Results for orders placed or performed in visit on  10/19/15  CULTURE, URINE COMPREHENSIVE  Result Value Ref Range   Urine Culture, Comprehensive Final report    Result 1 Comment   Microscopic Examination  Result Value Ref Range   WBC, UA 0-5 0 -  5 /hpf   RBC, UA None seen 0 -  2 /hpf   Epithelial Cells (non renal) 0-10 0 - 10 /hpf   Mucus, UA Present (A) Not Estab.   Bacteria, UA Few (A) None seen/Few  Urinalysis, Complete  Result Value Ref Range   Specific Gravity, UA 1.010 1.005 - 1.030   pH, UA 6.0 5.0 - 7.5   Color, UA Yellow Yellow   Appearance Ur Clear Clear   Leukocytes, UA Negative Negative   Protein, UA Negative Negative/Trace   Glucose, UA Negative Negative   Ketones, UA Negative Negative   RBC, UA Trace (A) Negative   Bilirubin, UA Negative Negative   Urobilinogen, Ur 0.2 0.2 - 1.0 mg/dL   Nitrite, UA Negative Negative   Microscopic Examination See below:      Assessment & Plan:    1. Microscopic hematuria:   Patient underwent a CT Urogram on 10/10/2015 for the first stage of the hematuria work up.  It found an indeterminate right renal lesion.  The left ureter was not completely opacified on the study.  I offered the patient and her medical power of attorney a cystoscopy with bilateral retrogrades and they would like to pursue this study.  She is on a daily ASA 81 mg.   I described to the patient how the procedure is performed and the risk associated with the procedure, such as: Infection, bleeding, uncomfortableness for the first few days after the procedure, the possibility of a biopsy of an area of concern in the ureters or bladder and the possibility of a stent placement.  I also explained the risks of general anesthesia, such as: MI, CVA, paralysis, coma and/or death.  2. Indeterminate right renal lesion:   Patient will have a repeat study in 6 months to follow this lesion.    Return for cystoscopy with bilateral retrogrades.  Zara Council, [facility] [address], [place], [place] [address] [phone]

## 2015-10-21 LAB — CULTURE, URINE COMPREHENSIVE

## 2015-10-23 DIAGNOSIS — N289 Disorder of kidney and ureter, unspecified: Secondary | ICD-10-CM | POA: Insufficient documentation

## 2015-10-30 ENCOUNTER — Telehealth: Payer: Self-pay | Admitting: Radiology

## 2015-10-30 NOTE — Telephone Encounter (Signed)
Notified pt's caregiver, [name], of pre-admit testing appt on 11/07/15 @11 :15. Pt needs to take a list of her medications and anything she is allergic to to the appt. [name] voices understanding.

## 2015-10-30 NOTE — Telephone Encounter (Signed)
Spoke with pt's caregiver, [name]. Notified her of surgery scheduled 11/15/15, to hold ASA 81mg  5 days prior to surgery with last dose to be taken on 11/09/15, and to call day prior to surgery for arrival time to SDS. Will c/b with pre-admit testing appt. [name] voices understanding.

## 2015-10-30 NOTE — Telephone Encounter (Signed)
Order for pt to hold ASA 81mg  5 days prior to procedure written by Zara Council was mailed to pt's caregiver, [name] at [address], [place] [address].

## 2015-11-02 ENCOUNTER — Other Ambulatory Visit: Payer: Self-pay | Admitting: Unknown Physician Specialty

## 2015-11-03 ENCOUNTER — Other Ambulatory Visit: Payer: Self-pay | Admitting: Unknown Physician Specialty

## 2015-11-07 ENCOUNTER — Encounter
Admission: RE | Admit: 2015-11-07 | Discharge: 2015-11-07 | Disposition: A | Discharge status: Home / Self Care | Payer: Medicare Other | Source: Ambulatory Visit | Attending: Urology | Admitting: Urology

## 2015-11-07 DIAGNOSIS — Z01810 Encounter for preprocedural cardiovascular examination: Secondary | ICD-10-CM | POA: Insufficient documentation

## 2015-11-07 LAB — HCG, QUANTITATIVE, PREGNANCY: hCG, Beta Chain, Quant, S: 6 m[IU]/mL — ABNORMAL HIGH (ref <5)

## 2015-11-07 LAB — BASIC METABOLIC PANEL
ANION GAP: 10 (ref 5–15)
BUN: 11 mg/dL (ref 6–20)
CALCIUM: 9.8 mg/dL (ref 8.9–10.3)
CHLORIDE: 105 mmol/L (ref 101–111)
CO2: 26 mmol/L (ref 22–32)
CREATININE: 1.29 mg/dL — AB (ref 0.44–1.00)
GFR calc Af Amer: 53 mL/min — ABNORMAL LOW (ref >60)
GFR calc non Af Amer: 46 mL/min — ABNORMAL LOW (ref >60)
GLUCOSE: 80 mg/dL (ref 65–99)
Potassium: 4.4 mmol/L (ref 3.5–5.1)
Sodium: 141 mmol/L (ref 135–145)

## 2015-11-07 LAB — CBC
HEMATOCRIT: 40.8 % (ref 35.0–47.0)
Hemoglobin: 13.2 g/dL (ref 12.0–16.0)
MCH: 29.7 pg (ref 26.0–34.0)
MCHC: 32.3 g/dL (ref 32.0–36.0)
MCV: 91.9 fL (ref 80.0–100.0)
Platelets: 247 10*3/uL (ref 150–440)
RBC: 4.44 MIL/uL (ref 3.80–5.20)
RDW: 14.6 % — AB (ref 11.5–14.5)
WBC: 7.7 10*3/uL (ref 3.6–11.0)

## 2015-11-07 NOTE — Pre-Procedure Instructions (Signed)
EKG sent to [facility] from Anesthesia for review.  Copy also sent to Dr. Erlene Quan.

## 2015-11-07 NOTE — Patient Instructions (Signed)
  Your procedure is scheduled on: 11/15/15 Wed Report to Day Surgery.2nd floor medical mall To find out your arrival time please call [phone] between 1PM - 3PM on 11/14/15 Tues.  Remember: Instructions that are not followed completely may result in serious medical risk, up to and including death, or upon the discretion of your surgeon and anesthesiologist your surgery may need to be rescheduled.    _x___ 1. Do not eat food or drink liquids after midnight. No gum chewing or hard candies.     ____ 2. No Alcohol for 24 hours before or after surgery.   ____ 3. Bring all medications with you on the day of surgery if instructed.    __x_ 4. Notify your doctor if there is any change in your medical condition     (cold, fever, infections).     Do not wear jewelry, make-up, hairpins, clips or nail polish.  Do not wear lotions, powders, or perfumes. You may wear deodorant.  Do not shave 48 hours prior to surgery. Men may shave face and neck.  Do not bring valuables to the hospital.    [facility] is not responsible for any belongings or valuables.               Contacts, dentures or bridgework may not be worn into surgery.  Leave your suitcase in the car. After surgery it may be brought to your room.  For patients admitted to the hospital, discharge time is determined by your                treatment team.   Patients discharged the day of surgery will not be allowed to drive home.   Please read over the following fact sheets that you were given:      _x___ Take these medicines the morning of surgery with A SIP OF WATER:    1. clonazePAM (KLONOPIN) 0.5 MG tablet  2. DULoxetine (CYMBALTA) 60 MG capsule  3. ranitidine (ZANTAC) 150 MG tablet  4.  5.  6.  ____ Fleet Enema (as directed)   ____ Use CHG Soap as directed  ____ Use inhalers on the day of surgery  ____ Stop metformin 2 days prior to surgery    ____ Take 1/2 of usual insulin dose the night before surgery and none on  the morning of surgery.   _x___ Stop Coumadin/Plavix/aspirin on Stop aspirin 1 week before surgery  ____ Stop Anti-inflammatories on   ____ Stop supplements until after surgery.    ____ Bring C-Pap to the hospital.

## 2015-11-08 NOTE — Pre-Procedure Instructions (Addendum)
Cleared low risk by Dr Melrose Nakayama 10/24/15. Notified Amy at Dr Erlene Quan office. Ekg ok

## 2015-11-15 ENCOUNTER — Ambulatory Visit
Admission: RE | Admit: 2015-11-15 | Discharge: 2015-11-15 | Disposition: A | Discharge status: Home / Self Care | Payer: Medicare Other | Source: Ambulatory Visit | Attending: Urology | Admitting: Urology

## 2015-11-15 ENCOUNTER — Ambulatory Visit: Payer: Medicare Other | Admitting: Anesthesiology

## 2015-11-15 ENCOUNTER — Encounter: Payer: Self-pay | Admitting: *Deleted

## 2015-11-15 ENCOUNTER — Encounter
Admission: RE | Disposition: A | Discharge status: Home / Self Care | Payer: Self-pay | Source: Ambulatory Visit | Attending: Urology

## 2015-11-15 DIAGNOSIS — E669 Obesity, unspecified: Secondary | ICD-10-CM | POA: Diagnosis not present

## 2015-11-15 DIAGNOSIS — R32 Unspecified urinary incontinence: Secondary | ICD-10-CM | POA: Insufficient documentation

## 2015-11-15 DIAGNOSIS — Z7982 Long term (current) use of aspirin: Secondary | ICD-10-CM | POA: Insufficient documentation

## 2015-11-15 DIAGNOSIS — Z8249 Family history of ischemic heart disease and other diseases of the circulatory system: Secondary | ICD-10-CM | POA: Diagnosis not present

## 2015-11-15 DIAGNOSIS — Z79899 Other long term (current) drug therapy: Secondary | ICD-10-CM | POA: Insufficient documentation

## 2015-11-15 DIAGNOSIS — Z841 Family history of disorders of kidney and ureter: Secondary | ICD-10-CM | POA: Diagnosis not present

## 2015-11-15 DIAGNOSIS — Z886 Allergy status to analgesic agent status: Secondary | ICD-10-CM | POA: Diagnosis not present

## 2015-11-15 DIAGNOSIS — F329 Major depressive disorder, single episode, unspecified: Secondary | ICD-10-CM | POA: Insufficient documentation

## 2015-11-15 DIAGNOSIS — K5909 Other constipation: Secondary | ICD-10-CM | POA: Diagnosis not present

## 2015-11-15 DIAGNOSIS — F419 Anxiety disorder, unspecified: Secondary | ICD-10-CM | POA: Insufficient documentation

## 2015-11-15 DIAGNOSIS — Z6841 Body Mass Index (BMI) 40.0 and over, adult: Secondary | ICD-10-CM | POA: Diagnosis not present

## 2015-11-15 DIAGNOSIS — N289 Disorder of kidney and ureter, unspecified: Secondary | ICD-10-CM | POA: Insufficient documentation

## 2015-11-15 DIAGNOSIS — N183 Chronic kidney disease, stage 3 (moderate): Secondary | ICD-10-CM | POA: Diagnosis not present

## 2015-11-15 DIAGNOSIS — R3129 Other microscopic hematuria: Secondary | ICD-10-CM | POA: Diagnosis present

## 2015-11-15 DIAGNOSIS — K219 Gastro-esophageal reflux disease without esophagitis: Secondary | ICD-10-CM | POA: Diagnosis not present

## 2015-11-15 DIAGNOSIS — F6810 Factitious disorder, unspecified: Secondary | ICD-10-CM | POA: Insufficient documentation

## 2015-11-15 DIAGNOSIS — E785 Hyperlipidemia, unspecified: Secondary | ICD-10-CM | POA: Insufficient documentation

## 2015-11-15 DIAGNOSIS — F259 Schizoaffective disorder, unspecified: Secondary | ICD-10-CM | POA: Diagnosis not present

## 2015-11-15 DIAGNOSIS — Z9889 Other specified postprocedural states: Secondary | ICD-10-CM | POA: Diagnosis not present

## 2015-11-15 DIAGNOSIS — G43909 Migraine, unspecified, not intractable, without status migrainosus: Secondary | ICD-10-CM | POA: Insufficient documentation

## 2015-11-15 DIAGNOSIS — R311 Benign essential microscopic hematuria: Secondary | ICD-10-CM | POA: Diagnosis not present

## 2015-11-15 HISTORY — PX: CYSTOSCOPY W/ RETROGRADES: SHX1426

## 2015-11-15 LAB — POCT PREGNANCY, URINE: PREG TEST UR: NEGATIVE

## 2015-11-15 SURGERY — CYSTOSCOPY, WITH RETROGRADE PYELOGRAM
Anesthesia: General

## 2015-11-15 MED ORDER — FENTANYL CITRATE (PF) 100 MCG/2ML IJ SOLN
INTRAMUSCULAR | Status: AC
  Administered 2015-11-15: 25 ug via INTRAVENOUS
  Filled 2015-11-15: qty 2

## 2015-11-15 MED ORDER — CEFAZOLIN SODIUM 1-5 GM-% IV SOLN
1.0000 g | Freq: Once | INTRAVENOUS | Status: AC
  Administered 2015-11-15: 1 g via INTRAVENOUS

## 2015-11-15 MED ORDER — FENTANYL CITRATE (PF) 100 MCG/2ML IJ SOLN
INTRAMUSCULAR | Status: DC | PRN
  Administered 2015-11-15 (×2): 50 ug via INTRAVENOUS

## 2015-11-15 MED ORDER — FENTANYL CITRATE (PF) 100 MCG/2ML IJ SOLN
25.0000 ug | INTRAMUSCULAR | Status: AC | PRN
  Administered 2015-11-15 (×6): 25 ug via INTRAVENOUS

## 2015-11-15 MED ORDER — LACTATED RINGERS IV SOLN
INTRAVENOUS | Status: DC
  Administered 2015-11-15: 17:00:00 via INTRAVENOUS

## 2015-11-15 MED ORDER — SUCCINYLCHOLINE CHLORIDE 20 MG/ML IJ SOLN
INTRAMUSCULAR | Status: DC | PRN
  Administered 2015-11-15: 30 mg via INTRAVENOUS

## 2015-11-15 MED ORDER — PROPOFOL 10 MG/ML IV BOLUS
INTRAVENOUS | Status: DC | PRN
  Administered 2015-11-15: 150 mg via INTRAVENOUS
  Administered 2015-11-15: 50 mg via INTRAVENOUS
  Administered 2015-11-15: 30 mg via INTRAVENOUS

## 2015-11-15 MED ORDER — ONDANSETRON HCL 4 MG/2ML IJ SOLN
INTRAMUSCULAR | Status: DC | PRN
  Administered 2015-11-15: 4 mg via INTRAVENOUS

## 2015-11-15 MED ORDER — ONDANSETRON HCL 4 MG/2ML IJ SOLN
4.0000 mg | Freq: Once | INTRAMUSCULAR | Status: DC | PRN
Start: 2015-11-15 — End: 2015-11-16

## 2015-11-15 MED ORDER — MIDAZOLAM HCL 5 MG/5ML IJ SOLN
INTRAMUSCULAR | Status: DC | PRN
Start: 2015-11-15 — End: 2015-11-15
  Administered 2015-11-15: 2 mg via INTRAVENOUS

## 2015-11-15 MED ORDER — CEFAZOLIN SODIUM 1-5 GM-% IV SOLN
INTRAVENOUS | Status: AC
  Administered 2015-11-15: 1 g via INTRAVENOUS
  Filled 2015-11-15: qty 50

## 2015-11-15 SURGICAL SUPPLY — 22 items
BAG DRAIN CYSTO-URO LG1000N (MISCELLANEOUS) ×4 IMPLANT
CATH URETL 5X70 OPEN END (CATHETERS) ×4 IMPLANT
CONRAY 43 FOR UROLOGY 50M (MISCELLANEOUS) ×4 IMPLANT
CORD URO TURP 10FT (MISCELLANEOUS) ×4 IMPLANT
GLOVE BIO SURGEON STRL SZ 6.5 (GLOVE) ×3 IMPLANT
GLOVE BIO SURGEON STRL SZ7 (GLOVE) ×8 IMPLANT
GLOVE BIO SURGEONS STRL SZ 6.5 (GLOVE) ×1
GOWN STRL REUS W/ TWL LRG LVL3 (GOWN DISPOSABLE) ×4 IMPLANT
GOWN STRL REUS W/TWL LRG LVL3 (GOWN DISPOSABLE) ×4
KIT RM TURNOVER CYSTO AR (KITS) ×4 IMPLANT
PACK CYSTO AR (MISCELLANEOUS) ×4 IMPLANT
PAD GROUND ADULT SPLIT (MISCELLANEOUS) ×4 IMPLANT
PREP PVP WINGED SPONGE (MISCELLANEOUS) ×4 IMPLANT
PUMP SINGLE ACTION SAP (PUMP) IMPLANT
SENSORWIRE 0.038 NOT ANGLED (WIRE) ×4
SET CYSTO W/LG BORE CLAMP LF (SET/KITS/TRAYS/PACK) ×4 IMPLANT
SOL .9 NS 3000ML IRR  AL (IV SOLUTION) ×2
SOL .9 NS 3000ML IRR UROMATIC (IV SOLUTION) ×2 IMPLANT
SURGILUBE 2OZ TUBE FLIPTOP (MISCELLANEOUS) ×4 IMPLANT
WATER STERILE IRR 1000ML POUR (IV SOLUTION) ×4 IMPLANT
WATER STERILE IRR 3000ML UROMA (IV SOLUTION) ×4 IMPLANT
WIRE SENSOR 0.038 NOT ANGLED (WIRE) ×2 IMPLANT

## 2015-11-15 NOTE — Transfer of Care (Signed)
Immediate Anesthesia Transfer of Care Note  Patient: Rita [name]  Procedure(s) Performed: Procedure(s): CYSTOSCOPY WITH RETROGRADE PYELOGRAM (Bilateral) CYSTOSCOPY WITH BIOPSY (N/A)  Patient Location: PACU  Anesthesia Type:General  Level of Consciousness: awake  Airway & Oxygen Therapy: Patient Spontanous Breathing and Patient connected to face mask oxygen  Post-op Assessment: Report given to RN and Post -op Vital signs reviewed and stable  Post vital signs: stable  Last Vitals:  Filed Vitals:   11/15/15 1624 11/15/15 1857  BP: 170/86 171/93  Pulse: 86 97  Temp: 36.6 C 36.4 C  Resp: 18 10    Complications: No apparent anesthesia complications

## 2015-11-15 NOTE — Anesthesia Procedure Notes (Signed)
Procedure Name: LMA Insertion Date/Time: 11/15/2015 6:30 PM Performed by: Aline Brochure Pre-anesthesia Checklist: Patient identified, Emergency Drugs available, Suction available and Patient being monitored Patient Re-evaluated:Patient Re-evaluated prior to inductionOxygen Delivery Method: Circle system utilized Preoxygenation: Pre-oxygenation with 100% oxygen Intubation Type: IV induction Ventilation: Oral airway inserted - appropriate to patient size LMA: LMA inserted LMA Size: 3.5 Number of attempts: 1 Airway Equipment and Method: Patient positioned with wedge pillow Placement Confirmation: positive ETCO2 Tube secured with: Tape Dental Injury: Teeth and Oropharynx as per pre-operative assessment

## 2015-11-15 NOTE — Op Note (Signed)
Date of procedure: 11/15/2015  Preoperative diagnosis:  1. Microscopic hematuria   Postoperative diagnosis:  1. Microscopic hematuria   Procedure: 1. Cystoscopy 2. Bilateral retrograde pyelogram  Surgeon: Hollice Espy, MD  Anesthesia: General  Complications: None  Intraoperative findings: Normal bilateral retrogrades. No bladder pathology.  EBL: Minimal  Specimens: None  Drains: None  Indication: Rita [name] is a 54 y.o. patient with microscopic hematuria. She underwent workup with CT urogram, however, the distal ureters were not opacified. She was counseled to undergo bilateral retrograde to complete her workup..  After reviewing the management options for treatment, he elected to proceed with the above surgical procedure(s). We have discussed the potential benefits and risks of the procedure, side effects of the proposed treatment, the likelihood of the patient achieving the goals of the procedure, and any potential problems that might occur during the procedure or recuperation. Informed consent has been obtained.  Description of procedure:  The patient was taken to the operating room and general anesthesia was induced.  The patient was placed in the dorsal lithotomy position, prepped and draped in the usual sterile fashion, and preoperative antibiotics were administered. A preoperative time-out was performed.   A 21 French rigid cystoscope was advanced per urethra into the bladder. Of note her urethra was normal. Careful inspection of the bladder revealed normal urothelium without any ulcerations, tumors, or lesions. No bladder stones. The trigone was normal with clear reflux from both ureters. She was turned to the right UO which was cannulated using a 5 [place] open-ended ureteral catheter just within the distal ureter. A gentle retrograde pyelogram using half diluted Conray solution was injected revealing delicate ureters, decompressed upper tract collecting system without  any identifiable pathology. No filling defects were noted. The left UO was also intubated using a 5 [place] open-ended ureteral catheter and a retrograde was performed on the side as well. Again this ureter was delicate appearing, no filling defects. The upper tract was decompressed without any filling defects or pathology. The exam was completely normal today. The bladder was then drained and the scope was removed. She was cleaned and dried. She was repositioned the supine position, reversed from anesthesia, taken to the PACU in stable condition.   Plan: She'll follow up in 6 months with a renal ultrasound to follow her renal lesion.  Hollice Espy, M.D.

## 2015-11-15 NOTE — H&P (View-Only) (Signed)
11:30 AM   Rita [name] [birth year] [id number]  Referring provider: Kathrine Haddock, NP [place], [place] [address]  Chief Complaint  Patient presents with  . Results    CT    HPI: Patient is a 54 year old white female with schizoaffective disorder who presents today to discuss her CT Urogram results.    Background story Patient is a 54 year old white female with schizoaffective disorder who is referred to [place] by primary care physician for microscopic hematuria.  She presents today with her caregiver she is a limited historian.  Her caregiver denies any gross hematuria.  There has been intermittent dysuria and on rare occasion urinary incontinence.  Patient has been encouraged to increase her water intake recently and the dysuria has abated.  Urinary incontinence has occurred on 2 occasions with the scant amount of urine leaking.   UA at PCP's office was positive for 3-10 RBC's/hpf on 09/20/2015.  There has been no bloody discharge.  She has not had any recent fevers, chills, nausea or vomiting.    Today, patient states she is feeling well.  Her caregiver does not express any concerns.  CT Urogram performed on 10/10/2015 noted a sub centimeter right renal cortical lesion which was too small to characterize.  Her left ureter did not completely opacify on today's exam.  I have reviewed the images with the patient and her caregiver.  Her UA today was negative for microscopic hematuria and she does not endorse any gross hematuria.  PMH: Past Medical History  Diagnosis Date  . CKD (chronic kidney disease) stage 3, GFR 30-59 ml/min   . GERD (gastroesophageal reflux disease)   . Factitious disorder   . Intellectual disability due to developmental disorder, unspecified   . Migraine   . Depression   . Anxiety   . Hyperlipidemia   . Chronic tension headaches   . Obesity   . Chronic constipation   . Microscopic hematuria     Surgical History: Past Surgical History  Procedure  Laterality Date  . US echocardiography  2012    showed mild heart failure (LVEF 52%); small pericardial effusion  . Carpal tunnel release      Home Medications:    Medication List       This list is accurate as of: 10/19/15 11:30 AM.  Always use your most recent med list.               acetaminophen 500 MG tablet  Commonly known as:  TYLENOL  Take by mouth.     ASPIRIN LOW DOSE 81 MG EC tablet  Generic drug:  aspirin  TAKE 1 TABLET BY MOUTH ONCE DAILY.     clonazePAM 0.5 MG tablet  Commonly known as:  KLONOPIN  Take 1 tablet (0.5 mg total) by mouth 2 (two) times daily.     docusate sodium 100 MG capsule  Commonly known as:  COLACE  Take 1 capsule (100 mg total) by mouth daily as needed for mild constipation.     DULoxetine 30 MG capsule  Commonly known as:  CYMBALTA  Take 1 capsule (30 mg total) by mouth at bedtime.     DULoxetine 60 MG capsule  Commonly known as:  CYMBALTA  Take 1 capsule (60 mg total) by mouth daily.     magnesium oxide 400 MG tablet  Commonly known as:  MAG-OX  Take 1 tablet (400 mg total) by mouth daily.     multivitamin tablet  Take 1 tablet  by mouth daily.     QUEtiapine 100 MG tablet  Commonly known as:  SEROQUEL  Take 1 tablet (100 mg total) by mouth at bedtime.     ranitidine 150 MG tablet  Commonly known as:  ZANTAC  Take 1 tablet (150 mg total) by mouth as needed.     simvastatin 20 MG tablet  Commonly known as:  ZOCOR  Take 1 tablet (20 mg total) by mouth at bedtime.     SUMAtriptan 6 MG/0.5ML Soln injection  Commonly known as:  IMITREX  Inject 6 mg into the skin every 2 (two) hours as needed for migraine or headache. May repeat in 2 hours if headache persists or recurs.     topiramate 100 MG tablet  Commonly known as:  TOPAMAX  Take 100 mg by mouth 2 (two) times daily.     traZODone 50 MG tablet  Commonly known as:  DESYREL  Take 1 tablet (50 mg total) by mouth at bedtime.     Vitamin D (Cholecalciferol) 1000 UNITS  Caps  Take 1,000 Units by mouth daily.        Allergies:  Allergies  Allergen Reactions  . Advil [Ibuprofen] Other (See Comments)    Reaction:  Gives her kidney problems     Family History: Family History  Problem Relation Age of Onset  . Heart disease Father   . Hyperlipidemia Father   . Kidney disease Father   . Prostate cancer Neg Hx     Social History:  reports that she has never smoked. She has never used smokeless tobacco. She reports that she does not drink alcohol or use illicit drugs.  ROS: UROLOGY Frequent Urination?: No Hard to postpone urination?: No Burning/pain with urination?: No Get up at night to urinate?: No Leakage of urine?: No Urine stream starts and stops?: No Trouble starting stream?: No Do you have to strain to urinate?: No Blood in urine?: No Urinary tract infection?: No Sexually transmitted disease?: No Injury to kidneys or bladder?: No Painful intercourse?: No Weak stream?: No Currently pregnant?: No Vaginal bleeding?: No Last menstrual period?: n  Gastrointestinal Nausea?: No Vomiting?: No Indigestion/heartburn?: No Diarrhea?: No Constipation?: No  Constitutional Fever: No Night sweats?: No Weight loss?: No Fatigue?: No  Skin Skin rash/lesions?: No Itching?: No  Eyes Blurred vision?: No Double vision?: No  Ears/Nose/Throat Sore throat?: No Sinus problems?: No  Hematologic/Lymphatic Swollen glands?: No Easy bruising?: No  Cardiovascular Leg swelling?: No Chest pain?: No  Respiratory Cough?: No Shortness of breath?: No  Endocrine Excessive thirst?: No  Musculoskeletal Back pain?: No Joint pain?: No  Neurological Headaches?: Yes Dizziness?: No  Psychologic Depression?: Yes Anxiety?: No  Physical Exam: Blood pressure 155/85, pulse 98, height 5' (1.524 m), weight 209 lb (94.802 kg).  Constitutional: Well nourished. Alert and oriented, No acute distress. HEENT: [place] AT, moist mucus membranes.  Trachea midline, no masses. Cardiovascular: No clubbing, cyanosis, or edema. Respiratory: Normal respiratory effort, no increased work of breathing. Skin: No rashes, bruises or suspicious lesions. Lymph: No cervical or inguinal adenopathy. Neurologic: Grossly intact, no focal deficits, moving all 4 extremities. Psychiatric: Normal mood and affect.  Laboratory Data: Lab Results  Component Value Date   WBC 10.4 07/18/2015   HGB 12.6 07/18/2015   HCT 38.8 07/18/2015   MCV 90.1 07/18/2015   PLT 268 07/18/2015    Lab Results  Component Value Date   CREATININE 1.26* 10/02/2015    Urinalysis Results for orders placed or performed in visit on  10/19/15  CULTURE, URINE COMPREHENSIVE  Result Value Ref Range   Urine Culture, Comprehensive Final report    Result 1 Comment   Microscopic Examination  Result Value Ref Range   WBC, UA 0-5 0 -  5 /hpf   RBC, UA None seen 0 -  2 /hpf   Epithelial Cells (non renal) 0-10 0 - 10 /hpf   Mucus, UA Present (A) Not Estab.   Bacteria, UA Few (A) None seen/Few  Urinalysis, Complete  Result Value Ref Range   Specific Gravity, UA 1.010 1.005 - 1.030   pH, UA 6.0 5.0 - 7.5   Color, UA Yellow Yellow   Appearance Ur Clear Clear   Leukocytes, UA Negative Negative   Protein, UA Negative Negative/Trace   Glucose, UA Negative Negative   Ketones, UA Negative Negative   RBC, UA Trace (A) Negative   Bilirubin, UA Negative Negative   Urobilinogen, Ur 0.2 0.2 - 1.0 mg/dL   Nitrite, UA Negative Negative   Microscopic Examination See below:      Assessment & Plan:    1. Microscopic hematuria:   Patient underwent a CT Urogram on 10/10/2015 for the first stage of the hematuria work up.  It found an indeterminate right renal lesion.  The left ureter was not completely opacified on the study.  I offered the patient and her medical power of attorney a cystoscopy with bilateral retrogrades and they would like to pursue this study.  She is on a daily ASA 81 mg.   I described to the patient how the procedure is performed and the risk associated with the procedure, such as: Infection, bleeding, uncomfortableness for the first few days after the procedure, the possibility of a biopsy of an area of concern in the ureters or bladder and the possibility of a stent placement.  I also explained the risks of general anesthesia, such as: MI, CVA, paralysis, coma and/or death.  2. Indeterminate right renal lesion:   Patient will have a repeat study in 6 months to follow this lesion.    Return for cystoscopy with bilateral retrogrades.  Zara Council, [facility] [address], [place], [place] [address] [phone]

## 2015-11-15 NOTE — Interval H&P Note (Signed)
History and Physical Interval Note:  11/15/2015 6:04 PM  [name]  has presented today for surgery, with the diagnosis of MICROSCOPIC HEMATURIA  The various methods of treatment have been discussed with the patient and family. After consideration of risks, benefits and other options for treatment, the patient has consented to  Procedure(s): CYSTOSCOPY WITH RETROGRADE PYELOGRAM (Bilateral) CYSTOSCOPY WITH BIOPSY (N/A) as a surgical intervention .  The patient's history has been reviewed, patient examined, no change in status, stable for surgery.  I have reviewed the patient's chart and labs.  Questions were answered to the patient's satisfaction.     Rita [name]

## 2015-11-15 NOTE — Anesthesia Preprocedure Evaluation (Signed)
Anesthesia Evaluation  Patient identified by MRN, date of birth, ID band Patient awake    Reviewed: Allergy & Precautions, H&P , NPO status , Patient's Chart, lab work & pertinent test results, reviewed documented beta blocker date and time   Airway Mallampati: II  TM Distance: >3 FB Neck ROM: full    Dental  (+) Teeth Intact   Pulmonary neg pulmonary ROS,    Pulmonary exam normal        Cardiovascular Exercise Tolerance: Good hypertension, negative cardio ROS Normal cardiovascular exam Rate:Normal     Neuro/Psych  Headaches, PSYCHIATRIC DISORDERS negative neurological ROS  negative psych ROS   GI/Hepatic negative GI ROS, Neg liver ROS, GERD  ,  Endo/Other  negative endocrine ROS  Renal/GU Renal diseasenegative Renal ROS  negative genitourinary   Musculoskeletal   Abdominal   Peds  Hematology negative hematology ROS (+)   Anesthesia Other Findings   Reproductive/Obstetrics negative OB ROS                             Anesthesia Physical Anesthesia Plan  ASA: III  Anesthesia Plan: General LMA   Post-op Pain Management:    Induction:   Airway Management Planned:   Additional Equipment:   Intra-op Plan:   Post-operative Plan:   Informed Consent: I have reviewed the patients History and Physical, chart, labs and discussed the procedure including the risks, benefits and alternatives for the proposed anesthesia with the patient or authorized representative who has indicated his/her understanding and acceptance.     Plan Discussed with: CRNA  Anesthesia Plan Comments:         Anesthesia Quick Evaluation

## 2015-11-15 NOTE — Discharge Instructions (Signed)
Cystoscopy Cystoscopy is a procedure that is used to help your caregiver diagnose and sometimes treat conditions that affect your lower urinary tract. Your lower urinary tract includes your bladder and the tube through which urine passes from your bladder out of your body (urethra). Cystoscopy is performed with a thin, tube-shaped instrument (cystoscope). The cystoscope has lenses and a light at the end so that your caregiver can see inside your bladder. The cystoscope is inserted at the entrance of your urethra. Your caregiver guides it through your urethra and into your bladder. There are two main types of cystoscopy:  Flexible cystoscopy (with a flexible cystoscope).  Rigid cystoscopy (with a rigid cystoscope). Cystoscopy may be recommended for many conditions, including:  Urinary tract infections.  Blood in your urine (hematuria).  Loss of bladder control (urinary incontinence) or overactive bladder.  Unusual cells found in a urine sample.  Urinary blockage.  Painful urination. Cystoscopy may also be done to remove a sample of your tissue to be checked under a microscope (biopsy). It may also be done to remove or destroy bladder stones. LET YOUR CAREGIVER KNOW ABOUT:  Allergies to food or medicine.  Medicines taken, including vitamins, herbs, eyedrops, over-the-counter medicines, and creams.  Use of steroids (by mouth or creams).  Previous problems with anesthetics or numbing medicines.  History of bleeding problems or blood clots.  Previous surgery.  Other health problems, including diabetes and kidney problems.  Possibility of pregnancy, if this applies. PROCEDURE The area around the opening to your urethra will be cleaned. A medicine to numb your urethra (local anesthetic) is used. If a tissue sample or stone is removed during the procedure, you may be given a medicine to make you sleep (general anesthetic). Your caregiver will gently insert the tip of the cystoscope  into your urethra. The cystoscope will be slowly glided through your urethra and into your bladder. Sterile fluid will flow through the cystoscope and into your bladder. The fluid will expand and stretch your bladder. This gives your caregiver a better view of your bladder walls. The procedure lasts about 15-20 minutes. AFTER THE PROCEDURE If a local anesthetic is used, you will be allowed to go home as soon as you are ready. If a general anesthetic is used, you will be taken to a recovery area until you are stable. You may have temporary bleeding and burning on urination.   This information is not intended to replace advice given to you by your health care provider. Make sure you discuss any questions you have with your health care provider.   Document Released: 11/15/2000 Document Revised: 12/09/2014 Document Reviewed: 05/11/2012 Elsevier Interactive Patient Education 2016 [place]   1) The drugs that you were given will stay in your system until tomorrow so for the next 24 hours you should not:  A) Drive an automobile B) Make any legal decisions C) Drink any alcoholic beverage   2) You may resume regular meals tomorrow.  Today it is better to start with liquids and gradually work up to solid foods.  You may eat anything you prefer, but it is better to start with liquids, then soup and crackers, and gradually work up to solid foods.   3) Please notify your doctor immediately if you have any unusual bleeding, trouble breathing, redness and pain at the surgery site, drainage, fever, or pain not relieved by medication.    4) Additional Instructions:        Please contact  your physician with any problems or Same Day Surgery at [phone], Monday through Friday 6 am to 4 pm, or [place] at [facility] number at [phone].

## 2015-11-16 ENCOUNTER — Encounter: Payer: Self-pay | Admitting: Urology

## 2015-11-17 NOTE — Anesthesia Postprocedure Evaluation (Signed)
Anesthesia Post Note  Patient: Rita [name]  Procedure(s) Performed: Procedure(s) (LRB): CYSTOSCOPY WITH RETROGRADE PYELOGRAM (Bilateral)  Patient location during evaluation: PACU Anesthesia Type: General Level of consciousness: awake and alert Pain management: pain level controlled Vital Signs Assessment: post-procedure vital signs reviewed and stable Respiratory status: spontaneous breathing, nonlabored ventilation, respiratory function stable and patient connected to nasal cannula oxygen Cardiovascular status: blood pressure returned to baseline and stable Anesthetic complications: no    Last Vitals:  Filed Vitals:   11/15/15 1952 11/15/15 2013  BP: 176/86 149/82  Pulse: 72 70  Temp: 37.2 C 36.7 C  Resp: 16 14    Last Pain:  Filed Vitals:   11/16/15 0943  PainSc: 0-No pain                 Molli Barrows

## 2015-12-09 ENCOUNTER — Other Ambulatory Visit: Payer: Self-pay | Admitting: Unknown Physician Specialty

## 2015-12-14 DIAGNOSIS — F330 Major depressive disorder, recurrent, mild: Secondary | ICD-10-CM | POA: Diagnosis not present

## 2015-12-14 DIAGNOSIS — F419 Anxiety disorder, unspecified: Secondary | ICD-10-CM | POA: Diagnosis not present

## 2015-12-14 DIAGNOSIS — F71 Moderate intellectual disabilities: Secondary | ICD-10-CM | POA: Diagnosis not present

## 2015-12-25 ENCOUNTER — Ambulatory Visit: Payer: Self-pay | Admitting: Unknown Physician Specialty

## 2016-01-05 ENCOUNTER — Other Ambulatory Visit: Payer: Self-pay | Admitting: Unknown Physician Specialty

## 2016-01-09 ENCOUNTER — Encounter: Payer: Self-pay | Admitting: Unknown Physician Specialty

## 2016-01-09 ENCOUNTER — Ambulatory Visit (INDEPENDENT_AMBULATORY_CARE_PROVIDER_SITE_OTHER): Payer: Medicare Other | Admitting: Unknown Physician Specialty

## 2016-01-09 VITALS — BP 140/87 | HR 112 | Temp 98.5°F | Ht 59.2 in | Wt 209.4 lb

## 2016-01-09 DIAGNOSIS — E785 Hyperlipidemia, unspecified: Secondary | ICD-10-CM

## 2016-01-09 DIAGNOSIS — N183 Chronic kidney disease, stage 3 unspecified: Secondary | ICD-10-CM

## 2016-01-09 DIAGNOSIS — F330 Major depressive disorder, recurrent, mild: Secondary | ICD-10-CM

## 2016-01-09 DIAGNOSIS — E669 Obesity, unspecified: Secondary | ICD-10-CM | POA: Diagnosis not present

## 2016-01-09 DIAGNOSIS — R51 Headache: Secondary | ICD-10-CM

## 2016-01-09 DIAGNOSIS — G8929 Other chronic pain: Secondary | ICD-10-CM

## 2016-01-09 DIAGNOSIS — F3341 Major depressive disorder, recurrent, in partial remission: Secondary | ICD-10-CM

## 2016-01-09 LAB — MICROALBUMIN, URINE WAIVED
Creatinine, Urine Waived: 100 mg/dL (ref 10–300)
Microalb, Ur Waived: 10 mg/L (ref 0–19)

## 2016-01-09 MED ORDER — LISINOPRIL 5 MG PO TABS: 5.0000 mg | ORAL_TABLET | Freq: Every day | ORAL | Status: DC

## 2016-01-09 MED ORDER — TRIPLE ANTIBIOTIC 5-400-5000 EX OINT: 1.0000 "application " | TOPICAL_OINTMENT | Freq: Every day | CUTANEOUS | Status: DC | PRN

## 2016-01-09 MED ORDER — HYDROCORTISONE 1 % EX CREA: 1.0000 "application " | TOPICAL_CREAM | Freq: Two times a day (BID) | CUTANEOUS | Status: DC

## 2016-01-09 MED ORDER — MAGNESIUM OXIDE 400 MG PO TABS: 400.0000 mg | ORAL_TABLET | Freq: Every day | ORAL | Status: DC

## 2016-01-09 MED ORDER — RANITIDINE HCL 150 MG PO TABS: 150.0000 mg | ORAL_TABLET | ORAL | Status: DC | PRN

## 2016-01-09 MED ORDER — DEXTROMETHORPHAN POLISTIREX ER 30 MG/5ML PO SUER: 15.0000 mg | ORAL | Status: DC | PRN

## 2016-01-09 MED ORDER — VITAMIN D3 25 MCG (1000 UNIT) PO TABS: 1000.0000 [IU] | ORAL_TABLET | Freq: Every day | ORAL | Status: DC

## 2016-01-09 MED ORDER — THERA VITAL M PO TABS: 1.0000 | ORAL_TABLET | Freq: Every day | ORAL | Status: DC

## 2016-01-09 MED ORDER — ACETAMINOPHEN 500 MG PO TABS: 1000.0000 mg | ORAL_TABLET | Freq: Four times a day (QID) | ORAL | Status: DC | PRN

## 2016-01-09 MED ORDER — SIMVASTATIN 20 MG PO TABS: 20.0000 mg | ORAL_TABLET | Freq: Every day | ORAL | Status: DC

## 2016-01-09 MED ORDER — DOCUSATE SODIUM 100 MG PO CAPS: 100.0000 mg | ORAL_CAPSULE | Freq: Every day | ORAL | Status: DC | PRN

## 2016-01-09 MED ORDER — ASPIRIN 81 MG PO TBEC: 81.0000 mg | DELAYED_RELEASE_TABLET | Freq: Every day | ORAL | Status: DC

## 2016-01-09 NOTE — Assessment & Plan Note (Signed)
Stop sugar

## 2016-01-09 NOTE — Assessment & Plan Note (Signed)
Start Lisinopril.  Recheck in 3 months

## 2016-01-09 NOTE — Progress Notes (Signed)
BP 140/87 mmHg  Pulse 112  Temp(Src) 98.5 F (36.9 C)  Ht 4' 11.2" (1.504 m)  Wt 209 lb 6.4 oz (94.983 kg)  BMI 41.99 kg/m2  SpO2 96%  LMP  (LMP Unknown)   Subjective:    Patient ID: Rita [name], female    DOB: 12-28-1960, 55 y.o.   MRN: [id number]  HPI: Rita [name] is a 55 y.o. female  Chief Complaint  Patient presents with  . Hyperlipidemia  . Chronic Kidney Disease  . Medication Refill    pt's care giver states they needs refills on aspirin, vitamins and simvastatin. States she also needs a print out of refills for her record book.     Hyperlipidemia Using medications without problems: No Muscle aches  Diet compliance: healthy diet Exercise: Involved with multiple activities  Anxiety/depression Seems to be stable on present medications.  Per psychology  Chronic headaches Stable.   CKD  Stage 3 CKD probably related to chronic NSAID use from headaches.  She is not taking Tylenol.  Not taking an ACE.  BP is borderline high.    Needs FL2 filled out.    Relevant past medical, surgical, family and social history reviewed and updated as indicated. Interim medical history since our last visit reviewed. Allergies and medications reviewed and updated.  Review of Systems  Per HPI unless specifically indicated above     Objective:    BP 140/87 mmHg  Pulse 112  Temp(Src) 98.5 F (36.9 C)  Ht 4' 11.2" (1.504 m)  Wt 209 lb 6.4 oz (94.983 kg)  BMI 41.99 kg/m2  SpO2 96%  LMP  (LMP Unknown)  Wt Readings from Last 3 Encounters:  01/09/16 209 lb 6.4 oz (94.983 kg)  11/15/15 208 lb (94.348 kg)  11/07/15 208 lb (94.348 kg)    Physical Exam  Constitutional: She is oriented to person, place, and time. She appears well-developed and well-nourished. No distress.  HENT:  Head: Normocephalic and atraumatic.  Eyes: Conjunctivae and lids are normal. Right eye exhibits no discharge. Left eye exhibits no discharge. No scleral icterus.  Neck: Normal range of motion.  Neck supple. No JVD present. Carotid bruit is not present.  Cardiovascular: Normal rate, regular rhythm and normal heart sounds.   Pulmonary/Chest: Effort normal and breath sounds normal.  Abdominal: Normal appearance. There is no splenomegaly or hepatomegaly.  Musculoskeletal: Normal range of motion.  Neurological: She is alert and oriented to person, place, and time.  Skin: Skin is warm, dry and intact. No rash noted. No pallor.  Psychiatric: She has a normal mood and affect. Her behavior is normal. Judgment and thought content normal.    Results for orders placed or performed during the hospital encounter of 11/15/15  Pregnancy, urine POC  Result Value Ref Range   Preg Test, Ur NEGATIVE NEGATIVE      Assessment & Plan:   Problem List Items Addressed This Visit      Unprioritized   CKD (chronic kidney disease) stage 3, GFR 30-59 ml/min   Relevant Orders   Microalbumin, Urine Waived   Uric acid   Comprehensive metabolic panel   Hyperlipidemia   Relevant Medications   aspirin (ASPIRIN LOW DOSE) 81 MG EC tablet   simvastatin (ZOCOR) 20 MG tablet   lisinopril (PRINIVIL,ZESTRIL) 5 MG tablet   Other Relevant Orders   Lipid Panel w/o Chol/HDL Ratio   Obesity    Stop sugar      Relevant Medications   magnesium oxide (MAG-OX) 400 MG  tablet   Chronic kidney disease, stage 3    Start Lisinopril.  Recheck in 3 months      Depression, major, in partial remission ([place]) - Primary   Depression, major, recurrent, mild ([place])    Per psychiatry       Cephalalgia   Relevant Medications   SUMAtriptan (IMITREX) 50 MG tablet   acetaminophen (TYLENOL) 500 MG tablet   aspirin (ASPIRIN LOW DOSE) 81 MG EC tablet       Follow up plan: Return in about 3 months (around 04/07/2016) for BP and check CMP.

## 2016-01-09 NOTE — Assessment & Plan Note (Signed)
Per psychiatry 

## 2016-01-10 ENCOUNTER — Encounter: Payer: Self-pay | Admitting: Unknown Physician Specialty

## 2016-01-10 ENCOUNTER — Other Ambulatory Visit: Payer: Self-pay | Admitting: Unknown Physician Specialty

## 2016-01-10 DIAGNOSIS — E785 Hyperlipidemia, unspecified: Secondary | ICD-10-CM

## 2016-01-10 LAB — LIPID PANEL W/O CHOL/HDL RATIO
CHOLESTEROL TOTAL: 295 mg/dL — AB (ref 100–199)
HDL: 54 mg/dL (ref >39)
Triglycerides: 439 mg/dL — ABNORMAL HIGH (ref 0–149)

## 2016-01-10 LAB — COMPREHENSIVE METABOLIC PANEL
ALBUMIN: 4 g/dL (ref 3.5–5.5)
ALK PHOS: 125 IU/L — AB (ref 39–117)
ALT: 24 IU/L (ref 0–32)
AST: 24 IU/L (ref 0–40)
Albumin/Globulin Ratio: 1.3 (ref 1.1–2.5)
BILIRUBIN TOTAL: 0.2 mg/dL (ref 0.0–1.2)
BUN / CREAT RATIO: 10 (ref 9–23)
BUN: 12 mg/dL (ref 6–24)
CALCIUM: 9.6 mg/dL (ref 8.7–10.2)
CHLORIDE: 95 mmol/L — AB (ref 96–106)
CO2: 27 mmol/L (ref 18–29)
Creatinine, Ser: 1.24 mg/dL — ABNORMAL HIGH (ref 0.57–1.00)
GFR calc Af Amer: 57 mL/min/{1.73_m2} — ABNORMAL LOW (ref >59)
GFR calc non Af Amer: 49 mL/min/{1.73_m2} — ABNORMAL LOW (ref >59)
GLOBULIN, TOTAL: 3 g/dL (ref 1.5–4.5)
Glucose: 101 mg/dL — ABNORMAL HIGH (ref 65–99)
POTASSIUM: 4.2 mmol/L (ref 3.5–5.2)
SODIUM: 140 mmol/L (ref 134–144)
TOTAL PROTEIN: 7 g/dL (ref 6.0–8.5)

## 2016-01-10 LAB — URIC ACID: Uric Acid: 6.7 mg/dL (ref 2.5–7.1)

## 2016-01-11 DIAGNOSIS — H2513 Age-related nuclear cataract, bilateral: Secondary | ICD-10-CM | POA: Diagnosis not present

## 2016-01-12 ENCOUNTER — Telehealth: Payer: Self-pay

## 2016-01-12 NOTE — Telephone Encounter (Signed)
Patient called and left me a voicemail wanting to know about lab results. I called her back and spoke to the caregiver, [name], and let her know that they should be getting a letter in the mail soon with lab results. I asked for her to give [place] a call if they have any questions or concerns.

## 2016-01-30 ENCOUNTER — Other Ambulatory Visit: Payer: Self-pay

## 2016-01-30 NOTE — Telephone Encounter (Signed)
received a fax requesting refill for clonazepam .5 mg  Pt last seen on 08-08-15 next appt 02-05-16.

## 2016-01-31 MED ORDER — CLONAZEPAM 0.5 MG PO TABS: 0.5000 mg | ORAL_TABLET | Freq: Two times a day (BID) | ORAL | Status: DC

## 2016-02-02 ENCOUNTER — Other Ambulatory Visit: Payer: Medicare Other

## 2016-02-02 DIAGNOSIS — E785 Hyperlipidemia, unspecified: Secondary | ICD-10-CM

## 2016-02-03 ENCOUNTER — Other Ambulatory Visit: Payer: Self-pay | Admitting: Unknown Physician Specialty

## 2016-02-03 LAB — LIPID PANEL PICCOLO, WAIVED

## 2016-02-05 ENCOUNTER — Ambulatory Visit (INDEPENDENT_AMBULATORY_CARE_PROVIDER_SITE_OTHER): Payer: Medicare Other | Admitting: Psychiatry

## 2016-02-05 ENCOUNTER — Encounter: Payer: Self-pay | Admitting: Psychiatry

## 2016-02-05 VITALS — BP 120/82 | HR 108 | Temp 97.6°F | Ht 59.5 in | Wt 210.6 lb

## 2016-02-05 DIAGNOSIS — F259 Schizoaffective disorder, unspecified: Secondary | ICD-10-CM

## 2016-02-05 DIAGNOSIS — F3341 Major depressive disorder, recurrent, in partial remission: Secondary | ICD-10-CM

## 2016-02-05 DIAGNOSIS — F70 Mild intellectual disabilities: Secondary | ICD-10-CM | POA: Diagnosis not present

## 2016-02-05 MED ORDER — TRAZODONE HCL 50 MG PO TABS: 50.0000 mg | ORAL_TABLET | Freq: Every day | ORAL | Status: DC

## 2016-02-05 MED ORDER — QUETIAPINE FUMARATE 100 MG PO TABS: 100.0000 mg | ORAL_TABLET | Freq: Every day | ORAL | Status: DC

## 2016-02-05 MED ORDER — DULOXETINE HCL 30 MG PO CPEP: 30.0000 mg | ORAL_CAPSULE | Freq: Every day | ORAL | Status: DC

## 2016-02-05 MED ORDER — CLONAZEPAM 0.5 MG PO TABS: 0.5000 mg | ORAL_TABLET | Freq: Two times a day (BID) | ORAL | Status: DC

## 2016-02-05 MED ORDER — DULOXETINE HCL 60 MG PO CPEP: 60.0000 mg | ORAL_CAPSULE | Freq: Every day | ORAL | Status: DC

## 2016-02-05 NOTE — Progress Notes (Signed)
[facility] MD Progress Note  02/05/2016 2:28 PM Rita [name]  MRN:  [id number] Subjective: Follow-up for this 55 year old woman with mild to moderate intellectual delay, history of schizoaffective disorder and depression diagnosis. She continues to live in a group home. Things continue to go well for her. She seems quite happy. She is going to [employer] regularly. 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 She still visits with her parents regularly but seems to be doing well in her current environment. No new physical complaints. She still has occasional nights of not sleeping well but also has some times of napping too much during the day that probably contributes. No evidence of acute psychosis or suicidal ideation. Principal Problem: @PPROB @ Diagnosis:   Patient Active Problem List   Diagnosis Date Noted  . Renal lesion [N28.9] 10/23/2015  . Microscopic hematuria [R31.29] 10/02/2015  . Schizoaffective disorder, bipolar type ([place]) [F25.0] 08/08/2015  . Depression, major, recurrent, moderate ([place]) [F33.1] 08/08/2015  . Depression, major, in partial remission ([place]) [F32.4] 08/08/2015  . Anxiety disorder due to known physiological condition [F06.4] 08/08/2015  . BP (high blood pressure) [I10] 08/08/2015  . Intellectual disability [F79] 08/08/2015  . Feeble-minded [F70] 08/08/2015  . Headache, migraine [G43.909] 08/08/2015  . Depression, major, recurrent, mild ([place]) [F33.0] 08/08/2015  . Depression, major, severe recurrence ([place]) [F33.2] 08/08/2015  . Acute exacerbation of subchronic schizoaffective schizophrenia ([place]) [F25.8] 08/08/2015  . Chronic kidney disease, stage 3 [N18.3] 07/05/2015  . CKD (chronic kidney disease) stage 3, GFR 30-59 ml/min [N18.3]   . GERD (gastroesophageal reflux disease) [K21.9]   . Factitious disorder [F68.10]   . Intellectual disability due to developmental disorder, unspecified [F89, F79]   . Migraine [G43.909]   . Depression [F32.9]    . Hyperlipidemia [E78.5]   . Chronic tension headaches [G44.229]   . Obesity [E66.9]   . Chronic constipation [K59.00]   . Major depressive disorder, recurrent episode, in partial remission ([place]) [F33.41] 06/28/2015  . Anxiety state [F41.1] 06/28/2015  . Intellectual delay [F81.9] 06/28/2015  . Cephalalgia [R51] 06/24/2014   Total Time spent with patient: 25 minutes  Past Psychiatric History: Patient has a history of lifelong intellectual impairment. As an adult she had some psychiatric hospitalizations in a lot of emotional distress which seem to be worse when she was still living with her parents especially recently. In her current environment she seems to have settled down and is doing better. Has made statements in the past about self-harm but I don't think she is actually tried to kill her self. Doing well on current medicine  Past Medical History:  Past Medical History  Diagnosis Date  . GERD (gastroesophageal reflux disease)   . Factitious disorder   . Intellectual disability due to developmental disorder, unspecified   . Migraine   . Depression   . Anxiety   . Hyperlipidemia   . Chronic tension headaches   . Obesity   . Chronic constipation   . Microscopic hematuria   . CKD (chronic kidney disease) stage 3, GFR 30-59 ml/min   . Hypertension     Past Surgical History  Procedure Laterality Date  . US echocardiography  2012    showed mild heart failure (LVEF 52%); small pericardial effusion  . Carpal tunnel release    . Cystoscopy w/ retrogrades Bilateral 11/15/2015    Procedure: CYSTOSCOPY WITH RETROGRADE PYELOGRAM;  Surgeon: Hollice Espy, MD;  Location: [facility] ORS;  Service: Urology;  Laterality: Bilateral;   Family History:  Family  History  Problem Relation Age of Onset  . Heart disease Father   . Hyperlipidemia Father   . Kidney disease Father   . Prostate cancer Neg Hx    Family Psychiatric  History: No known family history of mental illness Social History:   History  Alcohol Use No     History  Drug Use No    Social History   Social History  . Marital Status: Single    Spouse Name: N/A  . Number of Children: N/A  . Years of Education: N/A   Social History Main Topics  . Smoking status: Never Smoker   . Smokeless tobacco: Never Used  . Alcohol Use: No  . Drug Use: No  . Sexual Activity: No   Other Topics Concern  . None   Social History Narrative   Additional Social History:                         Sleep: Fair  Appetite:  Fair  Current Medications: Current Outpatient Prescriptions  Medication Sig Dispense Refill  . acetaminophen (TYLENOL) 500 MG tablet Take 2 tablets (1,000 mg total) by mouth every 6 (six) hours as needed. 30 tablet 12  . aspirin (ASPIRIN LOW DOSE) 81 MG EC tablet Take 1 tablet (81 mg total) by mouth daily. Swallow whole. 90 tablet 3  . cholecalciferol (VITAMIN D) 1000 units tablet Take 1 tablet (1,000 Units total) by mouth daily. 90 tablet 3  . clonazePAM (KLONOPIN) 0.5 MG tablet Take 1 tablet (0.5 mg total) by mouth 2 (two) times daily. 60 tablet 5  . DULoxetine (CYMBALTA) 30 MG capsule Take 1 capsule (30 mg total) by mouth at bedtime. 30 capsule 5  . DULoxetine (CYMBALTA) 60 MG capsule Take 1 capsule (60 mg total) by mouth daily. 30 capsule 5  . hydrocortisone cream 1 % Apply 1 application topically 2 (two) times daily. 30 g 12  . lisinopril (PRINIVIL,ZESTRIL) 5 MG tablet Take 1 tablet (5 mg total) by mouth daily. 90 tablet 1  . magnesium oxide (MAG-OX) 400 MG tablet Take 1 tablet (400 mg total) by mouth daily. 90 tablet 3  . Multiple Vitamins-Minerals (MULTIVITAMIN) tablet Take 1 tablet by mouth daily. 90 tablet 3  . QUEtiapine (SEROQUEL) 100 MG tablet Take 1 tablet (100 mg total) by mouth at bedtime. 30 tablet 5  . ranitidine (ZANTAC) 150 MG tablet Take 1 tablet (150 mg total) by mouth as needed. 180 tablet 3  . simvastatin (ZOCOR) 20 MG tablet Take 1 tablet (20 mg total) by mouth at  bedtime. 90 tablet 3  . SUMAtriptan (IMITREX) 50 MG tablet Take 50 mg by mouth once a week.    . traZODone (DESYREL) 50 MG tablet Take 1 tablet (50 mg total) by mouth at bedtime. 30 tablet 5  . dextromethorphan (DELSYM) 30 MG/5ML liquid Take 2.5 mLs (15 mg total) by mouth as needed for cough. (Patient not taking: Reported on 02/05/2016) 89 mL 12  . docusate sodium (COLACE) 100 MG capsule Take 1 capsule (100 mg total) by mouth daily as needed for mild constipation. (Patient not taking: Reported on 02/05/2016) 30 capsule 12  . neomycin-bacitracin-polymyxin (NEOSPORIN) [phone] ointment Apply 1 application topically daily as needed. (Patient not taking: Reported on 02/05/2016) 28.3 g 12   No current facility-administered medications for this visit.    Lab Results: No results found for this or any previous visit (from the past 48 hour(s)).  Blood Alcohol level:  No  results found for: [facility]  Physical Findings: AIMS:  , ,  ,  ,    CIWA:    COWS:     Musculoskeletal: Strength & Muscle Tone: within normal limits Gait & Station: normal Patient leans: N/A  Psychiatric Specialty Exam: ROS  Blood pressure 120/82, pulse 108, temperature 97.6 F (36.4 C), temperature source Tympanic, height 4' 11.5" (1.511 m), weight 210 lb 9.6 oz (95.528 kg), SpO2 90 %.Body mass index is 41.84 kg/(m^2).  General Appearance: Fairly Groomed  Engineer, water::  Fair  Speech:  Slow  Volume:  Decreased  Mood:  Euthymic  Affect:  Appropriate  Thought Process:  Goal Directed  Orientation:  Full (Time, Place, and Person)  Thought Content:  Negative  Suicidal Thoughts:  No  Homicidal Thoughts:  No  Memory:  Immediate;   Fair Recent;   Fair Remote;   Fair  Judgement:  Fair  Insight:  Fair  Psychomotor Activity:  Decreased  Concentration:  Fair  Recall:  [employer] of Knowledge:Fair  Language: Fair  Akathisia:  No  Handed:  Right  AIMS (if indicated):     Assets:  Desire for Improvement Financial  Resources/Insurance Housing Physical Health Resilience Social Support  ADL's:  Intact  Cognition: Impaired,  Mild and Moderate  Sleep:      Treatment Plan Summary: Medication management and Plan Rita [name] continues to do well. No need to change any medicine. Reviewed medications with the patient and the group home owner who is here with her. We discussed possible treatments for her insomnia but I would prefer not to increase or change her medicine. Patient should continue on her current schedule and not worry too much about it. She accepts this. Follow-up in 6 months.  Alethia Berthold, MD 02/05/2016, 2:28 PM

## 2016-02-07 ENCOUNTER — Telehealth: Payer: Self-pay

## 2016-02-07 NOTE — Telephone Encounter (Signed)
Patient called wanting to know results of cholesterol. Lab that was drawn on 02/02/16 was cancelled. [name] do you know why this would be or do you know the result?

## 2016-02-08 NOTE — Telephone Encounter (Signed)
I looked in the patient's chart and the lab that was drawn 02/02/16 was cancelled. So I asked Lattie Haw in the lab about it and she said she called the big lab because they had to send the lab out. She said that she was told that the lab would be resulted today. So I called the patient to let her know what was going on and told her that [name] was out of the office today. I told her that as soon as [name] was able to see the results, we would call and let her know about them.

## 2016-02-09 ENCOUNTER — Other Ambulatory Visit: Payer: Self-pay

## 2016-02-09 DIAGNOSIS — E785 Hyperlipidemia, unspecified: Secondary | ICD-10-CM

## 2016-02-09 NOTE — Telephone Encounter (Signed)
Called and left patient a detailed message about what was going on. I apologized because we are needing her to come back in to have her lab drawn because of a mistake with [facility]. I asked for her to please give [place] a call back to schedule a lab visit.

## 2016-02-12 NOTE — Telephone Encounter (Signed)
Patient returned call. I explained to her what was going on. Patient asked for me to call her caregiver, [name], and schedule her appointment. So I called pam and scheduled patient's lab appointment for 02/16/16.

## 2016-02-12 NOTE — Telephone Encounter (Signed)
Called and left patient a voicemail asking for her to please return my call.  

## 2016-02-16 ENCOUNTER — Other Ambulatory Visit: Payer: Medicare Other

## 2016-02-16 DIAGNOSIS — E785 Hyperlipidemia, unspecified: Secondary | ICD-10-CM

## 2016-02-17 LAB — LIPID PANEL W/O CHOL/HDL RATIO
CHOLESTEROL TOTAL: 273 mg/dL — AB (ref 100–199)
HDL: 51 mg/dL (ref >39)
LDL Calculated: 152 mg/dL — ABNORMAL HIGH (ref 0–99)
TRIGLYCERIDES: 352 mg/dL — AB (ref 0–149)
VLDL Cholesterol Cal: 70 mg/dL — ABNORMAL HIGH (ref 5–40)

## 2016-02-19 ENCOUNTER — Other Ambulatory Visit: Payer: Self-pay | Admitting: Unknown Physician Specialty

## 2016-02-19 MED ORDER — SIMVASTATIN 40 MG PO TABS: 40.0000 mg | ORAL_TABLET | Freq: Every day | ORAL | Status: DC

## 2016-02-26 ENCOUNTER — Telehealth: Payer: Self-pay

## 2016-02-26 NOTE — Telephone Encounter (Signed)
Patient called and left a voicemail on Nancy's phone on Friday. Izora Gala was not here Friday so she forwarded me the message this morning. Patient wanted to know about her cholesterol. So I called and left her a message letting her know her cholesterol value, about the new medication, and that I had also spoke to [facility], her caregiver, about all of this. I asked for her to please give me a call if she has any questions or concerns.

## 2016-03-12 ENCOUNTER — Other Ambulatory Visit: Payer: Self-pay | Admitting: Unknown Physician Specialty

## 2016-03-24 ENCOUNTER — Other Ambulatory Visit: Payer: Self-pay | Admitting: Unknown Physician Specialty

## 2016-03-25 ENCOUNTER — Other Ambulatory Visit: Payer: Self-pay | Admitting: Unknown Physician Specialty

## 2016-04-08 ENCOUNTER — Encounter: Payer: Self-pay | Admitting: Unknown Physician Specialty

## 2016-04-08 ENCOUNTER — Ambulatory Visit (INDEPENDENT_AMBULATORY_CARE_PROVIDER_SITE_OTHER): Payer: Medicare Other | Admitting: Unknown Physician Specialty

## 2016-04-08 VITALS — BP 136/83 | HR 90 | Temp 98.2°F | Ht 59.7 in | Wt 213.4 lb

## 2016-04-08 DIAGNOSIS — Z0000 Encounter for general adult medical examination without abnormal findings: Secondary | ICD-10-CM | POA: Diagnosis not present

## 2016-04-08 DIAGNOSIS — I10 Essential (primary) hypertension: Secondary | ICD-10-CM

## 2016-04-08 DIAGNOSIS — Z5181 Encounter for therapeutic drug level monitoring: Secondary | ICD-10-CM | POA: Diagnosis not present

## 2016-04-08 DIAGNOSIS — E785 Hyperlipidemia, unspecified: Secondary | ICD-10-CM | POA: Diagnosis not present

## 2016-04-08 NOTE — Progress Notes (Signed)
BP 136/83 mmHg  Pulse 90  Temp(Src) 98.2 F (36.8 C)  Ht 4' 11.7" (1.516 m)  Wt 213 lb 6.4 oz (96.798 kg)  BMI 42.12 kg/m2  SpO2 98%  LMP  (LMP Unknown)   Subjective:    Patient ID: Rita [name], female    DOB: 1961-09-28, 55 y.o.   MRN: [id number]  HPI: Rita [name] is a 55 y.o. female  Chief Complaint  Patient presents with  . Hyperlipidemia  . Hypertension  . Depression  . Labs Only    Hep C and HIV orders entered   Hypertension Using medications without difficulty Average home BPs   No problems or lightheadedness No chest pain with exertion or shortness of breath No Edema   Hyperlipidemia Using medications without problems: No Muscle aches  Diet compliance: does well except in school Exercise: regular walking and has a pedometer  Depression screen [facility] 2/9 04/08/2016 07/05/2015  Decreased Interest 0 0  Down, Depressed, Hopeless 0 0  PHQ - 2 Score 0 0    Relevant past medical, surgical, family and social history reviewed and updated as indicated. Interim medical history since our last visit reviewed. Allergies and medications reviewed and updated.  Review of Systems  Per HPI unless specifically indicated above     Objective:    BP 136/83 mmHg  Pulse 90  Temp(Src) 98.2 F (36.8 C)  Ht 4' 11.7" (1.516 m)  Wt 213 lb 6.4 oz (96.798 kg)  BMI 42.12 kg/m2  SpO2 98%  LMP  (LMP Unknown)  Wt Readings from Last 3 Encounters:  04/08/16 213 lb 6.4 oz (96.798 kg)  02/05/16 210 lb 9.6 oz (95.528 kg)  01/09/16 209 lb 6.4 oz (94.983 kg)    Physical Exam  Constitutional: She is oriented to person, place, and time. She appears well-developed and well-nourished. No distress.  HENT:  Head: Normocephalic and atraumatic.  Eyes: Conjunctivae and lids are normal. Right eye exhibits no discharge. Left eye exhibits no discharge. No scleral icterus.  Neck: Normal range of motion. Neck supple. No JVD present. Carotid bruit is not present.  Cardiovascular: Normal rate,  regular rhythm and normal heart sounds.   Pulmonary/Chest: Effort normal and breath sounds normal.  Abdominal: Normal appearance. There is no splenomegaly or hepatomegaly.  Musculoskeletal: Normal range of motion.  Neurological: She is alert and oriented to person, place, and time.  Skin: Skin is warm, dry and intact. No rash noted. No pallor.  Psychiatric: She has a normal mood and affect. Her behavior is normal. Judgment and thought content normal.    Results for orders placed or performed in visit on 02/16/16  Lipid Panel w/o Chol/HDL Ratio  Result Value Ref Range   Cholesterol, Total 273 (H) 100 - 199 mg/dL   Triglycerides 352 (H) 0 - 149 mg/dL   HDL 51 >39 mg/dL   VLDL Cholesterol Cal 70 (H) 5 - 40 mg/dL   LDL Calculated 152 (H) 0 - 99 mg/dL      Assessment & Plan:   Problem List Items Addressed This Visit      Unprioritized   BP (high blood pressure)   Hyperlipidemia    Lipemic sample.  Need to send out.  Pt is trying to work on cutting back sugar.        Relevant Orders   Lipid Panel Starr Sinclair    Other Visit Diagnoses    Health care maintenance    -  Primary    Relevant Orders  Hepatitis C antibody    HIV antibody    Medication monitoring encounter        Relevant Orders    Comprehensive metabolic panel        Follow up plan: Return in about 3 months (around 07/09/2016).

## 2016-04-08 NOTE — Assessment & Plan Note (Signed)
Lipemic sample.  Need to send out.  Pt is trying to work on cutting back sugar.

## 2016-04-09 ENCOUNTER — Encounter: Payer: Self-pay | Admitting: Unknown Physician Specialty

## 2016-04-09 LAB — COMPREHENSIVE METABOLIC PANEL
ALT: 20 IU/L (ref 0–32)
AST: 15 IU/L (ref 0–40)
Albumin/Globulin Ratio: 1.5 (ref 1.2–2.2)
Albumin: 4.1 g/dL (ref 3.5–5.5)
Alkaline Phosphatase: 113 IU/L (ref 39–117)
BUN/Creatinine Ratio: 8 — ABNORMAL LOW (ref 9–23)
BUN: 11 mg/dL (ref 6–24)
Bilirubin Total: 0.2 mg/dL (ref 0.0–1.2)
CALCIUM: 10 mg/dL (ref 8.7–10.2)
CO2: 27 mmol/L (ref 18–29)
CREATININE: 1.33 mg/dL — AB (ref 0.57–1.00)
Chloride: 99 mmol/L (ref 96–106)
GFR calc Af Amer: 52 mL/min/{1.73_m2} — ABNORMAL LOW (ref >59)
GFR, EST NON AFRICAN AMERICAN: 45 mL/min/{1.73_m2} — AB (ref >59)
GLOBULIN, TOTAL: 2.8 g/dL (ref 1.5–4.5)
GLUCOSE: 85 mg/dL (ref 65–99)
Potassium: 4.5 mmol/L (ref 3.5–5.2)
SODIUM: 140 mmol/L (ref 134–144)
Total Protein: 6.9 g/dL (ref 6.0–8.5)

## 2016-04-09 LAB — LIPID PANEL W/O CHOL/HDL RATIO
Cholesterol, Total: 243 mg/dL — ABNORMAL HIGH (ref 100–199)
HDL: 44 mg/dL (ref >39)
TRIGLYCERIDES: 496 mg/dL — AB (ref 0–149)

## 2016-04-09 LAB — HIV ANTIBODY (ROUTINE TESTING W REFLEX): HIV Screen 4th Generation wRfx: NONREACTIVE

## 2016-04-09 LAB — HEPATITIS C ANTIBODY

## 2016-04-09 LAB — SPECIMEN STATUS REPORT

## 2016-04-29 ENCOUNTER — Other Ambulatory Visit: Payer: Self-pay | Admitting: Unknown Physician Specialty

## 2016-04-30 ENCOUNTER — Telehealth: Payer: Self-pay

## 2016-04-30 NOTE — Telephone Encounter (Signed)
rachel neese called to check on the status of a letter that you had said that you was going to write for them.  She states that [facility] is trying to change patient Rita [name] to a level 2 instead of a level 3.  Needs letter faxed as soon as possible to [phone] attn: Herminio Heads  Her phone # if you have to speak with her is  [phone] and her cell is  [phone]

## 2016-05-02 NOTE — Telephone Encounter (Signed)
Done. I faxed it.

## 2016-05-03 ENCOUNTER — Other Ambulatory Visit: Payer: Self-pay | Admitting: Family Medicine

## 2016-05-06 ENCOUNTER — Ambulatory Visit
Admission: RE | Admit: 2016-05-06 | Discharge: 2016-05-06 | Disposition: A | Discharge status: Home / Self Care | Payer: Medicare Other | Source: Ambulatory Visit | Attending: Urology | Admitting: Urology

## 2016-05-06 DIAGNOSIS — R3129 Other microscopic hematuria: Secondary | ICD-10-CM | POA: Diagnosis not present

## 2016-05-07 ENCOUNTER — Telehealth: Payer: Self-pay

## 2016-05-07 NOTE — Telephone Encounter (Signed)
-----   Message from Hollice Espy, MD sent at 05/06/2016  5:12 PM EDT ----- Please let this patient now that her renal ultrasound was normal. I don't think that she needs any further renal imaging or follow-up at this point.  Hollice Espy, MD

## 2016-05-07 NOTE — Telephone Encounter (Signed)
Spoke with pt caregiver, [name], in reference to RUS results. [name] voiced understanding.

## 2016-05-29 ENCOUNTER — Other Ambulatory Visit: Payer: Self-pay | Admitting: Psychiatry

## 2016-05-29 ENCOUNTER — Telehealth: Payer: Self-pay

## 2016-05-29 ENCOUNTER — Other Ambulatory Visit: Payer: Self-pay

## 2016-05-29 ENCOUNTER — Other Ambulatory Visit: Payer: Self-pay | Admitting: Unknown Physician Specialty

## 2016-05-29 MED ORDER — CLONAZEPAM 0.5 MG PO TABS: 0.5000 mg | ORAL_TABLET | Freq: Two times a day (BID) | ORAL | Status: DC

## 2016-05-29 NOTE — Telephone Encounter (Signed)
I phoned in a refill on this with 3 refills on her Klonopin.

## 2016-05-29 NOTE — Telephone Encounter (Signed)
received a fax requesting a refill on clonazepam .5mg 

## 2016-05-29 NOTE — Telephone Encounter (Signed)
Tried calling the home number listed for the patient. No answer and voicemail was full. Will try to call again tomorrow.

## 2016-05-29 NOTE — Telephone Encounter (Signed)
Got a fax from the call center stating patient would like a call from our office when we open this morning. I tried calling patient and there was no answer. I left her a voicemail asking for her to please return my call.

## 2016-05-30 NOTE — Telephone Encounter (Signed)
Called and spoke to patient's caregiver. She states that the patient was complaining of not being able to sleep. She stated that she told the patient that she is not going to sleep well when she lays around all day. I asked if she needed to be seen or anything and she said she had an appointment coming up so there was nothing to really worry about now. She stated she would let the patient know that I called and they would call if they needed anything.

## 2016-06-05 ENCOUNTER — Telehealth: Payer: Self-pay | Admitting: Unknown Physician Specialty

## 2016-06-05 NOTE — Telephone Encounter (Signed)
Please tell Reilyn I do not prescribe weight loss medications

## 2016-06-05 NOTE — Telephone Encounter (Signed)
Routing to Cheryl

## 2016-06-05 NOTE — Telephone Encounter (Signed)
Pt called would like to talk to [facility] about a medication to lose weight. Please call pt ASAP. Thanks.

## 2016-06-05 NOTE — Telephone Encounter (Signed)
Called and left patient a voicemail letting her know what [name] said.

## 2016-06-09 ENCOUNTER — Other Ambulatory Visit: Payer: Self-pay | Admitting: Unknown Physician Specialty

## 2016-07-01 ENCOUNTER — Other Ambulatory Visit: Payer: Self-pay | Admitting: Unknown Physician Specialty

## 2016-07-01 ENCOUNTER — Encounter (INDEPENDENT_AMBULATORY_CARE_PROVIDER_SITE_OTHER): Payer: Self-pay

## 2016-07-01 ENCOUNTER — Other Ambulatory Visit: Payer: Self-pay

## 2016-07-01 MED ORDER — MAGNESIUM OXIDE 400 (241.3 MG) MG PO TABS: 1.0000 | ORAL_TABLET | Freq: Every day | ORAL | 12 refills | Status: DC

## 2016-07-08 ENCOUNTER — Encounter: Payer: Self-pay | Admitting: Unknown Physician Specialty

## 2016-07-15 ENCOUNTER — Ambulatory Visit (INDEPENDENT_AMBULATORY_CARE_PROVIDER_SITE_OTHER): Payer: Medicare Other | Admitting: Unknown Physician Specialty

## 2016-07-15 ENCOUNTER — Encounter: Payer: Self-pay | Admitting: Unknown Physician Specialty

## 2016-07-15 VITALS — BP 134/90 | HR 88 | Temp 98.0°F | Ht 59.7 in | Wt 210.6 lb

## 2016-07-15 DIAGNOSIS — N183 Chronic kidney disease, stage 3 unspecified: Secondary | ICD-10-CM

## 2016-07-15 DIAGNOSIS — I10 Essential (primary) hypertension: Secondary | ICD-10-CM | POA: Diagnosis not present

## 2016-07-15 DIAGNOSIS — Z0000 Encounter for general adult medical examination without abnormal findings: Secondary | ICD-10-CM

## 2016-07-15 DIAGNOSIS — E785 Hyperlipidemia, unspecified: Secondary | ICD-10-CM | POA: Diagnosis not present

## 2016-07-15 DIAGNOSIS — R3129 Other microscopic hematuria: Secondary | ICD-10-CM | POA: Diagnosis not present

## 2016-07-15 MED ORDER — SIMVASTATIN 40 MG PO TABS: 40.0000 mg | ORAL_TABLET | Freq: Every day | ORAL | 12 refills | Status: DC

## 2016-07-15 MED ORDER — RANITIDINE HCL 150 MG PO TABS: 150.0000 mg | ORAL_TABLET | ORAL | 3 refills | Status: DC | PRN

## 2016-07-15 MED ORDER — LISINOPRIL 5 MG PO TABS: 5.0000 mg | ORAL_TABLET | Freq: Every day | ORAL | 12 refills | Status: DC

## 2016-07-15 NOTE — Assessment & Plan Note (Signed)
Stable, continue present medications.   

## 2016-07-15 NOTE — Progress Notes (Signed)
BP 134/90 (BP Location: Left Arm, Patient Position: Sitting, Cuff Size: Large)   Pulse 88   Temp 98 F (36.7 C)   Ht 4' 11.7" (1.516 m)   Wt 210 lb 9.6 oz (95.5 kg)   LMP  (LMP Unknown)   SpO2 97%   BMI 41.54 kg/m    Subjective:    Patient ID: Rita [name], female    DOB: Jul 17, 1961, 55 y.o.   MRN: [id number]  HPI: Rita [name] is a 56 y.o. female  Chief Complaint  Patient presents with  . Medicare Wellness   Functional Status Survey: Is the patient deaf or have difficulty hearing?: No Does the patient have difficulty seeing, even when wearing glasses/contacts?: No Does the patient have difficulty concentrating, remembering, or making decisions?: No Does the patient have difficulty walking or climbing stairs?: No Does the patient have difficulty dressing or bathing?: No Does the patient have difficulty doing errands alone such as visiting a doctor's office or shopping?: Yes  Fall Risk  07/15/2016 07/05/2015  Falls in the past year? No No   Depression screen [facility] 2/9 07/15/2016 04/08/2016 07/05/2015  Decreased Interest 0 0 0  Down, Depressed, Hopeless 0 0 0  PHQ - 2 Score 0 0 0   Social History   Social History  . Marital status: Single    Spouse name: N/A  . Number of children: N/A  . Years of education: N/A   Occupational History  . Not on file.   Social History Main Topics  . Smoking status: Never Smoker  . Smokeless tobacco: Never Used  . Alcohol use No  . Drug use: No  . Sexual activity: No   Other Topics Concern  . Not on file   Social History Narrative  . No narrative on file   Family History  Problem Relation Age of Onset  . Heart disease Father   . Hyperlipidemia Father   . Kidney disease Father   . Prostate cancer Neg Hx    Hypertension Using medications without difficulty Average home BPs   No problems or lightheadedness No chest pain with exertion or shortness of breath No Edema Seeing   Hyperlipidemia Using medications without  problems: No Muscle aches  Diet compliance: variable Exercise: Not exercising   CKD Went to Urology for hematuria.  and had an [place] which was normal and no f/u scheduled  Relevant past medical, surgical, family and social history reviewed and updated as indicated. Interim medical history since our last visit reviewed. Allergies and medications reviewed and updated.  Review of Systems  Per HPI unless specifically indicated above     Objective:    BP 134/90 (BP Location: Left Arm, Patient Position: Sitting, Cuff Size: Large)   Pulse 88   Temp 98 F (36.7 C)   Ht 4' 11.7" (1.516 m)   Wt 210 lb 9.6 oz (95.5 kg)   LMP  (LMP Unknown)   SpO2 97%   BMI 41.54 kg/m   Wt Readings from Last 3 Encounters:  07/15/16 210 lb 9.6 oz (95.5 kg)  04/08/16 213 lb 6.4 oz (96.8 kg)  02/05/16 210 lb 9.6 oz (95.5 kg)    Physical Exam  Constitutional: She is oriented to person, place, and time. She appears well-developed and well-nourished. No distress.  HENT:  Head: Normocephalic and atraumatic.  Eyes: Conjunctivae and lids are normal. Right eye exhibits no discharge. Left eye exhibits no discharge. No scleral icterus.  Neck: Normal range of motion. Neck supple.  No JVD present. Carotid bruit is not present.  Cardiovascular: Normal rate, regular rhythm and normal heart sounds.   Pulmonary/Chest: Effort normal and breath sounds normal.  Abdominal: Normal appearance. There is no splenomegaly or hepatomegaly.  Musculoskeletal: Normal range of motion.  Neurological: She is alert and oriented to person, place, and time.  Skin: Skin is warm, dry and intact. No rash noted. No pallor.  Psychiatric: She has a normal mood and affect. Her behavior is normal. Judgment and thought content normal.    Results for orders placed or performed in visit on 04/08/16  Hepatitis C antibody  Result Value Ref Range   Hep C Virus Ab <0.1 0.0 - 0.9 s/co ratio  HIV antibody  Result Value Ref Range   HIV Screen 4th  Generation wRfx Non Reactive Non Reactive  Comprehensive metabolic panel  Result Value Ref Range   Glucose 85 65 - 99 mg/dL   BUN 11 6 - 24 mg/dL   Creatinine, Ser 1.33 (H) 0.57 - 1.00 mg/dL   GFR calc non Af Amer 45 (L) >59 mL/min/1.73   GFR calc Af Amer 52 (L) >59 mL/min/1.73   BUN/Creatinine Ratio 8 (L) 9 - 23   Sodium 140 134 - 144 mmol/L   Potassium 4.5 3.5 - 5.2 mmol/L   Chloride 99 96 - 106 mmol/L   CO2 27 18 - 29 mmol/L   Calcium 10.0 8.7 - 10.2 mg/dL   Total Protein 6.9 6.0 - 8.5 g/dL   Albumin 4.1 3.5 - 5.5 g/dL   Globulin, Total 2.8 1.5 - 4.5 g/dL   Albumin/Globulin Ratio 1.5 1.2 - 2.2   Bilirubin Total 0.2 0.0 - 1.2 mg/dL   Alkaline Phosphatase 113 39 - 117 IU/L   AST 15 0 - 40 IU/L   ALT 20 0 - 32 IU/L  Lipid Panel w/o Chol/HDL Ratio  Result Value Ref Range   Cholesterol, Total 243 (H) 100 - 199 mg/dL   Triglycerides 496 (H) 0 - 149 mg/dL   HDL 44 >39 mg/dL   VLDL Cholesterol Cal Comment 5 - 40 mg/dL   LDL Calculated Comment 0 - 99 mg/dL  Specimen status report  Result Value Ref Range   specimen status report Comment       Assessment & Plan:   Problem List Items Addressed This Visit      Unprioritized   BP (high blood pressure)    Stable, continue present medications.        Relevant Medications   simvastatin (ZOCOR) 40 MG tablet   lisinopril (PRINIVIL,ZESTRIL) 5 MG tablet   Chronic kidney disease, stage 3    Check CMP      Relevant Orders   Comprehensive metabolic panel   Hyperlipidemia   Relevant Medications   simvastatin (ZOCOR) 40 MG tablet   lisinopril (PRINIVIL,ZESTRIL) 5 MG tablet   Other Relevant Orders   Lipid Panel w/o Chol/HDL Ratio   Microscopic hematuria    Other Visit Diagnoses    Annual physical exam    -  Primary       Follow up plan: Return in about 6 months (around 01/15/2017).

## 2016-07-15 NOTE — Assessment & Plan Note (Signed)
Check CMP.  ?

## 2016-07-16 ENCOUNTER — Telehealth: Payer: Self-pay | Admitting: Unknown Physician Specialty

## 2016-07-16 ENCOUNTER — Encounter: Payer: Self-pay | Admitting: Unknown Physician Specialty

## 2016-07-16 DIAGNOSIS — N183 Chronic kidney disease, stage 3 unspecified: Secondary | ICD-10-CM

## 2016-07-16 LAB — COMPREHENSIVE METABOLIC PANEL
A/G RATIO: 1.4 (ref 1.2–2.2)
ALT: 32 IU/L (ref 0–32)
AST: 21 IU/L (ref 0–40)
Albumin: 4.3 g/dL (ref 3.5–5.5)
Alkaline Phosphatase: 128 IU/L — ABNORMAL HIGH (ref 39–117)
BUN/Creatinine Ratio: 10 (ref 9–23)
BUN: 14 mg/dL (ref 6–24)
Bilirubin Total: 0.2 mg/dL (ref 0.0–1.2)
CALCIUM: 9.8 mg/dL (ref 8.7–10.2)
CO2: 23 mmol/L (ref 18–29)
CREATININE: 1.37 mg/dL — AB (ref 0.57–1.00)
Chloride: 100 mmol/L (ref 96–106)
GFR, EST AFRICAN AMERICAN: 50 mL/min/{1.73_m2} — AB (ref >59)
GFR, EST NON AFRICAN AMERICAN: 44 mL/min/{1.73_m2} — AB (ref >59)
Globulin, Total: 3 g/dL (ref 1.5–4.5)
Glucose: 83 mg/dL (ref 65–99)
POTASSIUM: 4.5 mmol/L (ref 3.5–5.2)
Sodium: 140 mmol/L (ref 134–144)
TOTAL PROTEIN: 7.3 g/dL (ref 6.0–8.5)

## 2016-07-16 LAB — LIPID PANEL W/O CHOL/HDL RATIO
Cholesterol, Total: 266 mg/dL — ABNORMAL HIGH (ref 100–199)
HDL: 50 mg/dL (ref >39)
Triglycerides: 454 mg/dL — ABNORMAL HIGH (ref 0–149)

## 2016-07-16 NOTE — Telephone Encounter (Signed)
Discussed with caregiver about referral to Nephrology

## 2016-08-06 ENCOUNTER — Ambulatory Visit (INDEPENDENT_AMBULATORY_CARE_PROVIDER_SITE_OTHER): Payer: Medicare Other | Admitting: Psychiatry

## 2016-08-06 ENCOUNTER — Encounter: Payer: Self-pay | Admitting: Psychiatry

## 2016-08-06 VITALS — BP 112/74 | HR 90 | Temp 98.7°F | Ht 59.5 in | Wt 213.2 lb

## 2016-08-06 DIAGNOSIS — F70 Mild intellectual disabilities: Secondary | ICD-10-CM

## 2016-08-06 DIAGNOSIS — F259 Schizoaffective disorder, unspecified: Secondary | ICD-10-CM | POA: Diagnosis not present

## 2016-08-06 MED ORDER — DULOXETINE HCL 60 MG PO CPEP: 60.0000 mg | ORAL_CAPSULE | Freq: Every day | ORAL | 5 refills | Status: DC

## 2016-08-06 MED ORDER — TRAZODONE HCL 100 MG PO TABS: 100.0000 mg | ORAL_TABLET | Freq: Every day | ORAL | 5 refills | Status: DC

## 2016-08-06 MED ORDER — QUETIAPINE FUMARATE 100 MG PO TABS: 100.0000 mg | ORAL_TABLET | Freq: Every day | ORAL | 5 refills | Status: DC

## 2016-08-06 MED ORDER — CLONAZEPAM 0.5 MG PO TABS: 0.5000 mg | ORAL_TABLET | Freq: Two times a day (BID) | ORAL | 5 refills | Status: DC

## 2016-08-06 MED ORDER — DULOXETINE HCL 30 MG PO CPEP: 30.0000 mg | ORAL_CAPSULE | Freq: Every day | ORAL | 5 refills | Status: DC

## 2016-08-07 NOTE — Progress Notes (Signed)
Follow-up for this patient with chronic intellectual disability. Patient seen along with her caregiver. Also diagnosis of schizoaffective disorder. Patient has no new complaints. Mood has been good. Not having any panic attacks or suicidal thoughts or hallucinations. Seems to be getting along well and enjoying her life at the placement in a group home.  Very neatly dressed. More interactive than she used to be. Still doesn't make a lot of eye contact and doesn't directly speak to me as much as speak to me through the caregiver here but she was smiling and little more chatty. No sign of disorganized thinking or psychosis. No hallucinations evident. Denies suicidal ideation. Judgment and insight adequate.  She seems to be doing quite well. No major side effects. Continue current medicines as prescribed. Prescriptions completed. Reviewed medicine with patient and caregiver. Follow-up in another 6 months.

## 2016-08-28 ENCOUNTER — Other Ambulatory Visit: Payer: Self-pay | Admitting: Unknown Physician Specialty

## 2016-09-09 ENCOUNTER — Ambulatory Visit: Payer: Medicare Other | Admitting: Unknown Physician Specialty

## 2016-09-20 NOTE — Telephone Encounter (Signed)
Your patient 

## 2016-09-30 ENCOUNTER — Other Ambulatory Visit: Payer: Self-pay | Admitting: Unknown Physician Specialty

## 2016-10-01 ENCOUNTER — Other Ambulatory Visit: Payer: Self-pay | Admitting: Unknown Physician Specialty

## 2016-10-14 ENCOUNTER — Ambulatory Visit (INDEPENDENT_AMBULATORY_CARE_PROVIDER_SITE_OTHER): Payer: Medicare Other

## 2016-10-14 DIAGNOSIS — Z23 Encounter for immunization: Secondary | ICD-10-CM

## 2016-10-29 ENCOUNTER — Other Ambulatory Visit: Payer: Self-pay | Admitting: Unknown Physician Specialty

## 2016-11-27 ENCOUNTER — Other Ambulatory Visit: Payer: Self-pay | Admitting: Unknown Physician Specialty

## 2016-12-16 ENCOUNTER — Encounter: Payer: Self-pay | Admitting: Psychiatry

## 2016-12-16 ENCOUNTER — Ambulatory Visit (INDEPENDENT_AMBULATORY_CARE_PROVIDER_SITE_OTHER): Payer: 59 | Admitting: Psychiatry

## 2016-12-16 VITALS — BP 127/84 | HR 118 | Wt 219.2 lb

## 2016-12-16 DIAGNOSIS — F259 Schizoaffective disorder, unspecified: Secondary | ICD-10-CM

## 2016-12-16 DIAGNOSIS — F411 Generalized anxiety disorder: Secondary | ICD-10-CM

## 2016-12-16 DIAGNOSIS — F70 Mild intellectual disabilities: Secondary | ICD-10-CM

## 2016-12-16 MED ORDER — TRAZODONE HCL 100 MG PO TABS: 100.0000 mg | ORAL_TABLET | Freq: Every day | ORAL | 5 refills | Status: DC

## 2016-12-16 MED ORDER — CLONAZEPAM 0.5 MG PO TABS: 0.5000 mg | ORAL_TABLET | Freq: Two times a day (BID) | ORAL | 5 refills | Status: DC

## 2016-12-16 MED ORDER — QUETIAPINE FUMARATE 100 MG PO TABS: 100.0000 mg | ORAL_TABLET | Freq: Every day | ORAL | 5 refills | Status: DC

## 2016-12-16 MED ORDER — DULOXETINE HCL 60 MG PO CPEP: 60.0000 mg | ORAL_CAPSULE | Freq: Every day | ORAL | 5 refills | Status: DC

## 2016-12-16 MED ORDER — DULOXETINE HCL 30 MG PO CPEP: 30.0000 mg | ORAL_CAPSULE | Freq: Every day | ORAL | 5 refills | Status: DC

## 2016-12-16 NOTE — Progress Notes (Signed)
Follow-up psychiatry appointment for 56 year old woman with chronic moderate intellectual disability and history of schizophrenia. No new complaints. Behavior continues to have some of the same issues. She frequently makes impulsive phone calls that sometimes can be disruptive. Overall however she is functioning pretty well. Mood is generally okay. She sometimes gets a little obsessively worried about things like her weight and has to be redirected. Hasn't done anything really dangerousness. No suicidal thinking. Not reporting any hallucinations. Continues to tolerate medicine well without difficulty.  Neatly groomed woman looks her stated age. Accompanied by group home representative. Smiling. Euthymic affect. No specific complaints. Does not appear to be psychotic. Cognitively functioning stable.  Supportive therapy counseling completed. No change to medicine. Prescriptions all called in. Follow-up 6 months

## 2016-12-24 ENCOUNTER — Other Ambulatory Visit: Payer: Self-pay | Admitting: Unknown Physician Specialty

## 2016-12-31 ENCOUNTER — Ambulatory Visit: Payer: Self-pay | Admitting: Unknown Physician Specialty

## 2017-01-06 ENCOUNTER — Ambulatory Visit (INDEPENDENT_AMBULATORY_CARE_PROVIDER_SITE_OTHER): Payer: Medicare Other | Admitting: Unknown Physician Specialty

## 2017-01-06 ENCOUNTER — Encounter: Payer: Self-pay | Admitting: Unknown Physician Specialty

## 2017-01-06 VITALS — BP 137/85 | HR 112 | Temp 98.2°F | Ht 60.3 in | Wt 220.4 lb

## 2017-01-06 DIAGNOSIS — I10 Essential (primary) hypertension: Secondary | ICD-10-CM | POA: Diagnosis not present

## 2017-01-06 DIAGNOSIS — IMO0001 Reserved for inherently not codable concepts without codable children: Secondary | ICD-10-CM

## 2017-01-06 DIAGNOSIS — E782 Mixed hyperlipidemia: Secondary | ICD-10-CM

## 2017-01-06 DIAGNOSIS — R7301 Impaired fasting glucose: Secondary | ICD-10-CM | POA: Diagnosis not present

## 2017-01-06 DIAGNOSIS — Z6841 Body Mass Index (BMI) 40.0 and over, adult: Secondary | ICD-10-CM | POA: Diagnosis not present

## 2017-01-06 DIAGNOSIS — G44229 Chronic tension-type headache, not intractable: Secondary | ICD-10-CM

## 2017-01-06 DIAGNOSIS — E669 Obesity, unspecified: Secondary | ICD-10-CM | POA: Insufficient documentation

## 2017-01-06 DIAGNOSIS — Z0000 Encounter for general adult medical examination without abnormal findings: Secondary | ICD-10-CM

## 2017-01-06 DIAGNOSIS — E6609 Other obesity due to excess calories: Secondary | ICD-10-CM | POA: Diagnosis not present

## 2017-01-06 MED ORDER — DOCUSATE SODIUM 100 MG PO CAPS: 100.0000 mg | ORAL_CAPSULE | Freq: Two times a day (BID) | ORAL | 12 refills | Status: DC | PRN

## 2017-01-06 MED ORDER — RANITIDINE HCL 150 MG PO TABS: 150.0000 mg | ORAL_TABLET | ORAL | 3 refills | Status: DC | PRN

## 2017-01-06 MED ORDER — DEXTROMETHORPHAN POLISTIREX ER 30 MG/5ML PO SUER: 15.0000 mg | ORAL | 12 refills | Status: DC | PRN

## 2017-01-06 MED ORDER — MAGNESIUM OXIDE 400 (241.3 MG) MG PO TABS: 1.0000 | ORAL_TABLET | Freq: Every day | ORAL | 12 refills | Status: DC

## 2017-01-06 MED ORDER — TRIPLE ANTIBIOTIC 5-400-5000 EX OINT: 1.0000 "application " | TOPICAL_OINTMENT | Freq: Every day | CUTANEOUS | 12 refills | Status: DC | PRN

## 2017-01-06 MED ORDER — ACETAMINOPHEN 500 MG PO TABS: 1000.0000 mg | ORAL_TABLET | Freq: Four times a day (QID) | ORAL | 12 refills | Status: DC | PRN

## 2017-01-06 MED ORDER — VITAMIN D3 25 MCG (1000 UNIT) PO TABS: 1000.0000 [IU] | ORAL_TABLET | Freq: Every day | ORAL | 12 refills | Status: DC

## 2017-01-06 MED ORDER — LISINOPRIL 5 MG PO TABS: 5.0000 mg | ORAL_TABLET | Freq: Every day | ORAL | 12 refills | Status: DC

## 2017-01-06 MED ORDER — SIMVASTATIN 40 MG PO TABS: 40.0000 mg | ORAL_TABLET | Freq: Every day | ORAL | 12 refills | Status: DC

## 2017-01-06 MED ORDER — ASPIRIN 81 MG PO TBEC: 81.0000 mg | DELAYED_RELEASE_TABLET | Freq: Every day | ORAL | 3 refills | Status: DC

## 2017-01-06 NOTE — Assessment & Plan Note (Signed)
Seems to be improved

## 2017-01-06 NOTE — Assessment & Plan Note (Signed)
Discussed diet and exercise 

## 2017-01-06 NOTE — Progress Notes (Signed)
BP 137/85 (BP Location: Left Arm, Patient Position: Sitting, Cuff Size: Large)   Pulse (!) 112   Temp 98.2 F (36.8 C)   Ht 5' 0.3" (1.532 m)   Wt 220 lb 6.4 oz (100 kg)   LMP  (LMP Unknown)   SpO2 97%   BMI 42.62 kg/m    Subjective:    Patient ID: Rita [name], female    DOB: 1961-08-13, 56 y.o.   MRN: [id number]  HPI: Rita [name] is a 56 y.o. female  Chief Complaint  Patient presents with  . Weight Gain    pt states she noticed some weight gain last Tuesday or Wednesday    Pt is also here for annual physical and paperwork.  She lives in a group home and has all her care needs met.  She sees a number of other specialists.  Refusing Mammogram due to discomfort.  Paperwork for medicare physical is not appropriate for this mentally disabled patient. Primary concern is weight gain which has been a problem for many years now.    Weight gain Continued weight gain.  She has poor diet and exercise and has continued weight gain.    Hypertension Using medications without difficulty Average home BPs Not checking   No problems or lightheadedness No chest pain with exertion or shortness of breath No Edema   Hyperlipidemia Using medications without problems: No Muscle aches   Anxiety/depression Through mental health  Renal insufficiency Per Nephrology  Social History   Social History  . Marital status: Single    Spouse name: N/A  . Number of children: N/A  . Years of education: N/A   Occupational History  . Not on file.   Social History Main Topics  . Smoking status: Never Smoker  . Smokeless tobacco: Never Used  . Alcohol use No  . Drug use: No  . Sexual activity: No   Other Topics Concern  . Not on file   Social History Narrative  . No narrative on file   Family History  Problem Relation Age of Onset  . Heart disease Father   . Hyperlipidemia Father   . Kidney disease Father   . Prostate cancer Neg Hx    Past Medical History:  Diagnosis Date    . Anxiety   . Chronic constipation   . Chronic tension headaches   . CKD (chronic kidney disease) stage 3, GFR 30-59 ml/min   . Depression   . Factitious disorder   . GERD (gastroesophageal reflux disease)   . Hyperlipidemia   . Hypertension   . Intellectual disability due to developmental disorder, unspecified   . Microscopic hematuria   . Migraine   . Obesity    Past Surgical History:  Procedure Laterality Date  . CARPAL TUNNEL RELEASE    . CYSTOSCOPY W/ RETROGRADES Bilateral 11/15/2015   Procedure: CYSTOSCOPY WITH RETROGRADE PYELOGRAM;  Surgeon: Hollice Espy, MD;  Location: [facility] ORS;  Service: Urology;  Laterality: Bilateral;  . US ECHOCARDIOGRAPHY  2012   showed mild heart failure (LVEF 52%); small pericardial effusion    Relevant past medical, surgical, family and social history reviewed and updated as indicated. Interim medical history since our last visit reviewed. Allergies and medications reviewed and updated.  Review of Systems  Per HPI unless specifically indicated above     Objective:    BP 137/85 (BP Location: Left Arm, Patient Position: Sitting, Cuff Size: Large)   Pulse (!) 112   Temp 98.2 F (36.8 C)  Ht 5' 0.3" (1.532 m)   Wt 220 lb 6.4 oz (100 kg)   LMP  (LMP Unknown)   SpO2 97%   BMI 42.62 kg/m   Wt Readings from Last 3 Encounters:  01/06/17 220 lb 6.4 oz (100 kg)  07/15/16 210 lb 9.6 oz (95.5 kg)  04/08/16 213 lb 6.4 oz (96.8 kg)    Physical Exam  Constitutional: She is oriented to person, place, and time. She appears well-developed and well-nourished.  HENT:  Head: Normocephalic and atraumatic.  Eyes: Pupils are equal, round, and reactive to light. Right eye exhibits no discharge. Left eye exhibits no discharge. No scleral icterus.  Neck: Normal range of motion. Neck supple. Carotid bruit is not present. No thyromegaly present.  Cardiovascular: Normal rate, regular rhythm and normal heart sounds.  Exam reveals no gallop and no friction  rub.   No murmur heard. Pulmonary/Chest: Effort normal and breath sounds normal. No respiratory distress. She has no wheezes. She has no rales.  Abdominal: Soft. Bowel sounds are normal. There is no tenderness. There is no rebound.  Genitourinary: No breast swelling, tenderness or discharge.  Musculoskeletal: Normal range of motion.  Lymphadenopathy:    She has no cervical adenopathy.  Neurological: She is alert and oriented to person, place, and time.  Skin: Skin is warm, dry and intact. No rash noted.  Psychiatric: She has a normal mood and affect. Her speech is normal and behavior is normal. Judgment and thought content normal. Cognition and memory are normal.    Results for orders placed or performed in visit on 07/15/16  Lipid Panel w/o Chol/HDL Ratio  Result Value Ref Range   Cholesterol, Total 266 (H) 100 - 199 mg/dL   Triglycerides 454 (H) 0 - 149 mg/dL   HDL 50 >39 mg/dL   VLDL Cholesterol Cal Comment 5 - 40 mg/dL   LDL Calculated Comment 0 - 99 mg/dL  Comprehensive metabolic panel  Result Value Ref Range   Glucose 83 65 - 99 mg/dL   BUN 14 6 - 24 mg/dL   Creatinine, Ser 1.37 (H) 0.57 - 1.00 mg/dL   GFR calc non Af Amer 44 (L) >59 mL/min/1.73   GFR calc Af Amer 50 (L) >59 mL/min/1.73   BUN/Creatinine Ratio 10 9 - 23   Sodium 140 134 - 144 mmol/L   Potassium 4.5 3.5 - 5.2 mmol/L   Chloride 100 96 - 106 mmol/L   CO2 23 18 - 29 mmol/L   Calcium 9.8 8.7 - 10.2 mg/dL   Total Protein 7.3 6.0 - 8.5 g/dL   Albumin 4.3 3.5 - 5.5 g/dL   Globulin, Total 3.0 1.5 - 4.5 g/dL   Albumin/Globulin Ratio 1.4 1.2 - 2.2   Bilirubin Total 0.2 0.0 - 1.2 mg/dL   Alkaline Phosphatase 128 (H) 39 - 117 IU/L   AST 21 0 - 40 IU/L   ALT 32 0 - 32 IU/L      Assessment & Plan:   Problem List Items Addressed This Visit      Unprioritized   BP (high blood pressure)    Stable, continue present medications.        Relevant Medications   aspirin (ASPIRIN LOW DOSE) 81 MG EC tablet    lisinopril (PRINIVIL,ZESTRIL) 5 MG tablet   simvastatin (ZOCOR) 40 MG tablet   Other Relevant Orders   Comprehensive metabolic panel   Lipid Panel w/o Chol/HDL Ratio   Chronic tension headaches    Seems to be improved  Relevant Medications   acetaminophen (TYLENOL) 500 MG tablet   aspirin (ASPIRIN LOW DOSE) 81 MG EC tablet   Hyperlipidemia    Check Lipid panel      Relevant Medications   aspirin (ASPIRIN LOW DOSE) 81 MG EC tablet   lisinopril (PRINIVIL,ZESTRIL) 5 MG tablet   simvastatin (ZOCOR) 40 MG tablet   Obesity    Other Visit Diagnoses    Annual physical exam    -  Primary   refusing mammogram   IFG (impaired fasting glucose)       Relevant Orders   Bayer DCA Hb A1c Waived       Follow up plan: Return in about 6 months (around 07/06/2017).

## 2017-01-06 NOTE — Assessment & Plan Note (Signed)
Stable, continue present medications.   

## 2017-01-06 NOTE — Patient Instructions (Signed)
Contrave

## 2017-01-06 NOTE — Assessment & Plan Note (Signed)
Check Lipid panel 

## 2017-01-07 ENCOUNTER — Encounter: Payer: Self-pay | Admitting: Unknown Physician Specialty

## 2017-01-07 LAB — COMPREHENSIVE METABOLIC PANEL
A/G RATIO: 1.6 (ref 1.2–2.2)
ALT: 28 IU/L (ref 0–32)
AST: 21 IU/L (ref 0–40)
Albumin: 4.2 g/dL (ref 3.5–5.5)
Alkaline Phosphatase: 131 IU/L — ABNORMAL HIGH (ref 39–117)
BUN/Creatinine Ratio: 9 (ref 9–23)
BUN: 14 mg/dL (ref 6–24)
CALCIUM: 9.8 mg/dL (ref 8.7–10.2)
CHLORIDE: 99 mmol/L (ref 96–106)
CO2: 24 mmol/L (ref 18–29)
Creatinine, Ser: 1.58 mg/dL — ABNORMAL HIGH (ref 0.57–1.00)
GFR, EST AFRICAN AMERICAN: 42 mL/min/{1.73_m2} — AB (ref >59)
GFR, EST NON AFRICAN AMERICAN: 37 mL/min/{1.73_m2} — AB (ref >59)
GLOBULIN, TOTAL: 2.7 g/dL (ref 1.5–4.5)
Glucose: 111 mg/dL — ABNORMAL HIGH (ref 65–99)
POTASSIUM: 4.6 mmol/L (ref 3.5–5.2)
SODIUM: 139 mmol/L (ref 134–144)
Total Protein: 6.9 g/dL (ref 6.0–8.5)

## 2017-01-07 LAB — LIPID PANEL W/O CHOL/HDL RATIO
Cholesterol, Total: 237 mg/dL — ABNORMAL HIGH (ref 100–199)
HDL: 52 mg/dL (ref >39)
LDL Calculated: 107 mg/dL — ABNORMAL HIGH (ref 0–99)
Triglycerides: 388 mg/dL — ABNORMAL HIGH (ref 0–149)
VLDL Cholesterol Cal: 78 mg/dL — ABNORMAL HIGH (ref 5–40)

## 2017-01-07 LAB — BAYER DCA HB A1C WAIVED: HB A1C: 5.8 % (ref <7.0)

## 2017-01-09 ENCOUNTER — Telehealth: Payer: Self-pay | Admitting: Unknown Physician Specialty

## 2017-01-09 NOTE — Telephone Encounter (Signed)
Patient called to get her lab results.  Thanks

## 2017-01-10 NOTE — Telephone Encounter (Signed)
Called patient, care giver answered. Let her know that a letter was sent out them with results.

## 2017-02-03 ENCOUNTER — Ambulatory Visit: Payer: Medicare Other | Admitting: Psychiatry

## 2017-03-14 ENCOUNTER — Telehealth: Payer: Self-pay

## 2017-03-14 NOTE — Telephone Encounter (Signed)
Patient called wanting [name] to know that she was depressed because of her weight. She says she feels bad about it. And she wants her weight to come down. Patient wants to talk to [place] about this.

## 2017-03-14 NOTE — Telephone Encounter (Signed)
Routing to provider  

## 2017-03-17 NOTE — Telephone Encounter (Signed)
Please let Rita [name] know I am sorry for her weight issues.  Unfortunately, I don't have a medication for weight loss.  I would contact her psychiatrist to discuss her depression

## 2017-03-17 NOTE — Telephone Encounter (Signed)
Called and left patient a VM letting her know what [name] said. Asked for patient to give [place] a call if she has any questions or concerns.

## 2017-04-11 ENCOUNTER — Other Ambulatory Visit: Payer: Self-pay | Admitting: Internal Medicine

## 2017-04-11 DIAGNOSIS — Z1231 Encounter for screening mammogram for malignant neoplasm of breast: Secondary | ICD-10-CM

## 2017-05-02 ENCOUNTER — Ambulatory Visit: Payer: Medicare Other

## 2017-05-19 ENCOUNTER — Other Ambulatory Visit: Payer: Self-pay | Admitting: Psychiatry

## 2017-06-16 ENCOUNTER — Encounter: Payer: Self-pay | Admitting: Psychiatry

## 2017-06-16 ENCOUNTER — Ambulatory Visit (INDEPENDENT_AMBULATORY_CARE_PROVIDER_SITE_OTHER): Payer: 59 | Admitting: Psychiatry

## 2017-06-16 VITALS — BP 128/77 | HR 91 | Ht 59.25 in | Wt 216.0 lb

## 2017-06-16 DIAGNOSIS — F259 Schizoaffective disorder, unspecified: Secondary | ICD-10-CM

## 2017-06-16 DIAGNOSIS — F411 Generalized anxiety disorder: Secondary | ICD-10-CM | POA: Diagnosis not present

## 2017-06-16 DIAGNOSIS — F819 Developmental disorder of scholastic skills, unspecified: Secondary | ICD-10-CM | POA: Diagnosis not present

## 2017-06-17 MED ORDER — QUETIAPINE FUMARATE 100 MG PO TABS
100.0000 mg | ORAL_TABLET | Freq: Every day | ORAL | 5 refills | Status: DC
Start: 2017-06-17 — End: 2017-12-22

## 2017-06-17 MED ORDER — CLONAZEPAM 0.5 MG PO TABS: 0.5000 mg | ORAL_TABLET | Freq: Two times a day (BID) | ORAL | 5 refills | Status: DC

## 2017-06-17 MED ORDER — DULOXETINE HCL 30 MG PO CPEP: 30.0000 mg | ORAL_CAPSULE | Freq: Every day | ORAL | 5 refills | Status: DC

## 2017-06-17 MED ORDER — DULOXETINE HCL 60 MG PO CPEP: ORAL_CAPSULE | ORAL | 5 refills | Status: DC

## 2017-06-17 MED ORDER — TRAZODONE HCL 100 MG PO TABS: 100.0000 mg | ORAL_TABLET | Freq: Every day | ORAL | 5 refills | Status: DC

## 2017-06-17 NOTE — Progress Notes (Signed)
Follow-up for 56 year old woman with a history of schizoaffective disorder and with chronic intellectual disability. She is present today along with the manager of her group home. Neither of them have any complaints. Rita [name] has been doing quite well. Has reasonable social interactions. As far as her physical health or has been concern about her weight and she is still trying to lose weight. We talked about her medication and how it might affect it. The group home manager strongly feels that the Seroquel is quite helpful for her. I don't think we need to make any changes to her medicine for now.  Casually dressed. Smiling up beat. Normal speech for [place]. No complaints. No psychosis no threats no suicidality.  Supportive counseling. Reviewed medicine. Refill everything see her back in 6 months. Call sooner if needed.

## 2017-06-26 ENCOUNTER — Encounter: Payer: Self-pay | Admitting: *Deleted

## 2017-06-27 ENCOUNTER — Ambulatory Visit: Payer: Medicare Other | Admitting: Anesthesiology

## 2017-06-27 ENCOUNTER — Ambulatory Visit
Admission: RE | Admit: 2017-06-27 | Discharge: 2017-06-27 | Disposition: A | Discharge status: Home / Self Care | Payer: Medicare Other | Source: Ambulatory Visit | Attending: Unknown Physician Specialty | Admitting: Unknown Physician Specialty

## 2017-06-27 ENCOUNTER — Encounter: Payer: Self-pay | Admitting: *Deleted

## 2017-06-27 ENCOUNTER — Encounter
Admission: RE | Disposition: A | Discharge status: Home / Self Care | Payer: Self-pay | Source: Ambulatory Visit | Attending: Unknown Physician Specialty

## 2017-06-27 DIAGNOSIS — Z8371 Family history of colonic polyps: Secondary | ICD-10-CM | POA: Insufficient documentation

## 2017-06-27 DIAGNOSIS — I130 Hypertensive heart and chronic kidney disease with heart failure and stage 1 through stage 4 chronic kidney disease, or unspecified chronic kidney disease: Secondary | ICD-10-CM | POA: Diagnosis not present

## 2017-06-27 DIAGNOSIS — Z7982 Long term (current) use of aspirin: Secondary | ICD-10-CM | POA: Insufficient documentation

## 2017-06-27 DIAGNOSIS — Z79899 Other long term (current) drug therapy: Secondary | ICD-10-CM | POA: Insufficient documentation

## 2017-06-27 DIAGNOSIS — E785 Hyperlipidemia, unspecified: Secondary | ICD-10-CM | POA: Diagnosis not present

## 2017-06-27 DIAGNOSIS — N183 Chronic kidney disease, stage 3 (moderate): Secondary | ICD-10-CM | POA: Insufficient documentation

## 2017-06-27 DIAGNOSIS — F79 Unspecified intellectual disabilities: Secondary | ICD-10-CM | POA: Diagnosis not present

## 2017-06-27 DIAGNOSIS — F419 Anxiety disorder, unspecified: Secondary | ICD-10-CM | POA: Diagnosis not present

## 2017-06-27 DIAGNOSIS — Z1211 Encounter for screening for malignant neoplasm of colon: Secondary | ICD-10-CM | POA: Diagnosis present

## 2017-06-27 DIAGNOSIS — I509 Heart failure, unspecified: Secondary | ICD-10-CM | POA: Insufficient documentation

## 2017-06-27 DIAGNOSIS — K5909 Other constipation: Secondary | ICD-10-CM | POA: Diagnosis not present

## 2017-06-27 DIAGNOSIS — K640 First degree hemorrhoids: Secondary | ICD-10-CM | POA: Insufficient documentation

## 2017-06-27 DIAGNOSIS — D128 Benign neoplasm of rectum: Secondary | ICD-10-CM | POA: Insufficient documentation

## 2017-06-27 DIAGNOSIS — K219 Gastro-esophageal reflux disease without esophagitis: Secondary | ICD-10-CM | POA: Insufficient documentation

## 2017-06-27 DIAGNOSIS — E669 Obesity, unspecified: Secondary | ICD-10-CM | POA: Diagnosis not present

## 2017-06-27 DIAGNOSIS — F329 Major depressive disorder, single episode, unspecified: Secondary | ICD-10-CM | POA: Diagnosis not present

## 2017-06-27 DIAGNOSIS — Z6841 Body Mass Index (BMI) 40.0 and over, adult: Secondary | ICD-10-CM | POA: Insufficient documentation

## 2017-06-27 HISTORY — DX: Unspecified intellectual disabilities: F79

## 2017-06-27 HISTORY — DX: Benign neoplasm, unspecified site: D36.9

## 2017-06-27 HISTORY — PX: COLONOSCOPY WITH PROPOFOL: SHX5780

## 2017-06-27 SURGERY — COLONOSCOPY WITH PROPOFOL
Anesthesia: General

## 2017-06-27 MED ORDER — LIDOCAINE HCL (PF) 2 % IJ SOLN
INTRAMUSCULAR | Status: AC
  Filled 2017-06-27: qty 2

## 2017-06-27 MED ORDER — PROPOFOL 10 MG/ML IV BOLUS
INTRAVENOUS | Status: DC | PRN
  Administered 2017-06-27: 60 mg via INTRAVENOUS
  Administered 2017-06-27: 10 mg via INTRAVENOUS

## 2017-06-27 MED ORDER — LIDOCAINE HCL (CARDIAC) 20 MG/ML IV SOLN
INTRAVENOUS | Status: DC | PRN
  Administered 2017-06-27: 50 mg via INTRAVENOUS

## 2017-06-27 MED ORDER — SODIUM CHLORIDE 0.9 % IV SOLN
INTRAVENOUS | Status: DC
  Administered 2017-06-27: 1000 mL via INTRAVENOUS

## 2017-06-27 MED ORDER — PROPOFOL 500 MG/50ML IV EMUL
INTRAVENOUS | Status: DC | PRN
Start: 2017-06-27 — End: 2017-06-27
  Administered 2017-06-27: 150 ug/kg/min via INTRAVENOUS

## 2017-06-27 MED ORDER — PROPOFOL 500 MG/50ML IV EMUL
INTRAVENOUS | Status: AC
  Filled 2017-06-27: qty 50

## 2017-06-27 MED ORDER — SODIUM CHLORIDE 0.9 % IV SOLN: INTRAVENOUS | Status: DC

## 2017-06-27 NOTE — H&P (Signed)
Primary Care Physician:  Kathrine Haddock, NP Primary Gastroenterologist:  Dr. Vira Agar  Pre-Procedure History & Physical: HPI:  Rita [name] is a 56 y.o. female is here for an colonoscopy.   Past Medical History:  Diagnosis Date  . Adenomatous polyps   . Anxiety   . Chronic constipation   . Chronic tension headaches   . CKD (chronic kidney disease) stage 3, GFR 30-59 ml/min   . Depression   . Factitious disorder   . GERD (gastroesophageal reflux disease)   . Hyperlipidemia   . Hypertension   . Intellectual disability due to developmental disorder, unspecified   . Mental retardation   . Microscopic hematuria   . Migraine   . Obesity     Past Surgical History:  Procedure Laterality Date  . CARPAL TUNNEL RELEASE    . CYSTOSCOPY W/ RETROGRADES Bilateral 11/15/2015   Procedure: CYSTOSCOPY WITH RETROGRADE PYELOGRAM;  Surgeon: Hollice Espy, MD;  Location: [facility] ORS;  Service: Urology;  Laterality: Bilateral;  . US ECHOCARDIOGRAPHY  2012   showed mild heart failure (LVEF 52%); small pericardial effusion    Prior to Admission medications   Medication Sig Start Date End Date Taking? Authorizing Provider  aspirin (ASPIRIN LOW DOSE) 81 MG EC tablet Take 1 tablet (81 mg total) by mouth daily. Swallow whole. 01/06/17  Yes Kathrine Haddock, NP  cholecalciferol (VITAMIN D) 1000 units tablet Take 1 tablet (1,000 Units total) by mouth daily. 01/06/17  Yes Kathrine Haddock, NP  clonazePAM (KLONOPIN) 0.5 MG tablet Take 1 tablet (0.5 mg total) by mouth 2 (two) times daily. 06/17/17  Yes Clapacs, Madie Reno, MD  docusate sodium (COLACE) 100 MG capsule Take 1 capsule (100 mg total) by mouth 2 (two) times daily as needed for mild constipation. 01/06/17  Yes Kathrine Haddock, NP  lisinopril (PRINIVIL,ZESTRIL) 5 MG tablet Take 1 tablet (5 mg total) by mouth daily. 01/06/17  Yes Kathrine Haddock, NP  magnesium oxide (MAGNESIUM-OXIDE) 400 (241.3 Mg) MG tablet Take 1 tablet (400 mg total) by mouth daily. 01/06/17  Yes  Kathrine Haddock, NP  acetaminophen (TYLENOL) 500 MG tablet Take 2 tablets (1,000 mg total) by mouth every 6 (six) hours as needed. 01/06/17   Kathrine Haddock, NP  dextromethorphan (DELSYM) 30 MG/5ML liquid Take 2.5 mLs (15 mg total) by mouth as needed for cough. 01/06/17   Kathrine Haddock, NP  DULoxetine (CYMBALTA) 30 MG capsule Take 1 capsule (30 mg total) by mouth at bedtime. 06/17/17   Clapacs, Madie Reno, MD  DULoxetine (CYMBALTA) 60 MG capsule TAKE (1) CAPSULE BY MOUTH ONCE DAILY. 06/17/17   Clapacs, Madie Reno, MD  hydrocortisone cream 1 % Apply 1 application topically 2 (two) times daily. 01/09/16   Kathrine Haddock, NP  Multiple Vitamins-Minerals (MULTIVITAMIN) tablet Take 1 tablet by mouth daily. 01/09/16 01/07/17  Kathrine Haddock, NP  neomycin-bacitracin-polymyxin (NEOSPORIN) [phone] ointment Apply 1 application topically daily as needed. 01/06/17   Kathrine Haddock, NP  QUEtiapine (SEROQUEL) 100 MG tablet Take 1 tablet (100 mg total) by mouth at bedtime. 06/17/17   Clapacs, Madie Reno, MD  ranitidine (ZANTAC) 150 MG tablet Take 1 tablet (150 mg total) by mouth as needed. 01/06/17   Kathrine Haddock, NP  simvastatin (ZOCOR) 40 MG tablet Take 1 tablet (40 mg total) by mouth at bedtime. 01/06/17   Kathrine Haddock, NP  SUMAtriptan (IMITREX) 50 MG tablet Take 50 mg by mouth once a week. 12/28/15   [provider]  traZODone (DESYREL) 100 MG tablet Take 1 tablet (100 mg  total) by mouth at bedtime. 06/17/17   Clapacs, Madie Reno, MD    Allergies as of 02/17/2017 - Review Complete 01/06/2017  Allergen Reaction Noted  . Advil [ibuprofen] Other (See Comments) 07/18/2015    Family History  Problem Relation Age of Onset  . Heart disease Father   . Hyperlipidemia Father   . Kidney disease Father   . Prostate cancer Neg Hx     Social History   Social History  . Marital status: Single    Spouse name: N/A  . Number of children: N/A  . Years of education: N/A   Occupational History  . Not on file.   Social History Main  Topics  . Smoking status: Never Smoker  . Smokeless tobacco: Never Used  . Alcohol use No  . Drug use: No  . Sexual activity: No   Other Topics Concern  . Not on file   Social History Narrative  . No narrative on file    Review of Systems: See HPI, otherwise negative ROS  Physical Exam: BP 136/87   Pulse 80   Temp 97.6 F (36.4 C) (Tympanic)   Resp 18   Ht 5' (1.524 m)   Wt 97.5 kg (215 lb)   LMP  (LMP Unknown)   SpO2 100%   BMI 41.99 kg/m  General:   Alert,  pleasant and cooperative in NAD Head:  Normocephalic and atraumatic. Neck:  Supple; no masses or thyromegaly. Lungs:  Clear throughout to auscultation.    Heart:  Regular rate and rhythm. Abdomen:  Soft, nontender and nondistended. Normal bowel sounds, without guarding, and without rebound.   Neurologic:  Alert and  oriented x4;  grossly normal neurologically.  Impression/Plan: Rita [name] is here for an colonoscopy to be performed for [facility] colon polyps and family history of colon polyps in father and sister.  Risks, benefits, limitations, and alternatives regarding  colonoscopy have been reviewed with the patient.  Questions have been answered.  All parties agreeable.   Gaylyn Cheers, MD  06/27/2017, 12:20 PM

## 2017-06-27 NOTE — Anesthesia Post-op Follow-up Note (Cosign Needed)
Anesthesia QCDR form completed.        

## 2017-06-27 NOTE — Anesthesia Procedure Notes (Signed)
Performed by: Lance Muss Pre-anesthesia Checklist: Emergency Drugs available, Suction available, Patient identified, Patient being monitored and Timeout performed Patient Re-evaluated:Patient Re-evaluated prior to induction Oxygen Delivery Method: Nasal cannula Preoxygenation: Pre-oxygenation with 100% oxygen Induction Type: IV induction

## 2017-06-27 NOTE — Op Note (Signed)
[facility] Gastroenterology Patient Name: Rita [name] Procedure Date: 06/27/2017 12:19 PM MRN: [id number] Account #: [id number] Date of Birth: 06-03-1961 Admit Type: Outpatient Age: 56 Room: [facility] ENDO ROOM 1 Gender: Female Note Status: Finalized Procedure:            Colonoscopy Indications:          Colon cancer screening in patient at increased risk:                        Family history of 1st-degree relative with colon polyps Providers:            Manya Silvas, MD Referring MD:         Kathrine Haddock (Referring MD) Medicines:            Propofol per Anesthesia Complications:        No immediate complications. Procedure:            Pre-Anesthesia Assessment:                       - After reviewing the risks and benefits, the patient                        was deemed in satisfactory condition to undergo the                        procedure.                       After obtaining informed consent, the colonoscope was                        passed under direct vision. Throughout the procedure,                        the patient's blood pressure, pulse, and oxygen                        saturations were monitored continuously. The                        Colonoscope was introduced through the anus and                        advanced to the the cecum, identified by appendiceal                        orifice and ileocecal valve. The colonoscopy was                        performed without difficulty. The patient tolerated the                        procedure well. The quality of the bowel preparation                        was good. Findings:      A diminutive polyp was found in the recto-sigmoid colon. The polyp was       sessile. The polyp was removed with a hot snare. Resection and retrieval       were complete.      A  diminutive polyp was found in the rectum. The polyp was sessile. The       polyp was removed with a jumbo cold forceps. Resection and  retrieval       were complete.      Internal hemorrhoids were found during endoscopy. The hemorrhoids were       small and Grade I (internal hemorrhoids that do not prolapse).      The exam was otherwise without abnormality. Impression:           - One diminutive polyp at the recto-sigmoid colon,                        removed with a hot snare. Resected and retrieved.                       - One diminutive polyp in the rectum, removed with a                        jumbo cold forceps. Resected and retrieved.                       - Internal hemorrhoids.                       - The examination was otherwise normal. Recommendation:       - Await pathology results. Manya Silvas, MD 06/27/2017 1:02:16 PM This report has been signed electronically. Number of Addenda: 0 Note Initiated On: 06/27/2017 12:19 PM Scope Withdrawal Time: 0 hours 18 minutes 52 seconds  Total Procedure Duration: 0 hours 30 minutes 17 seconds       [facility]

## 2017-06-27 NOTE — Transfer of Care (Signed)
Immediate Anesthesia Transfer of Care Note  Patient: Rita [name]  Procedure(s) Performed: Procedure(s): COLONOSCOPY WITH PROPOFOL (N/A)  Patient Location: PACU  Anesthesia Type:General  Level of Consciousness: awake and alert   Airway & Oxygen Therapy: Patient Spontanous Breathing and Patient connected to nasal cannula oxygen  Post-op Assessment: Report given to RN and Post -op Vital signs reviewed and stable  Post vital signs: Reviewed and stable  Last Vitals:  Vitals:   06/27/17 1159 06/27/17 1302  BP: 136/87 106/65  Pulse: 80 72  Resp: 18 (!) 7  Temp: 36.4 C     Last Pain:  Vitals:   06/27/17 1159  TempSrc: Tympanic         Complications: No apparent anesthesia complications

## 2017-06-27 NOTE — Anesthesia Preprocedure Evaluation (Signed)
Anesthesia Evaluation  Patient identified by MRN, date of birth, ID band Patient awake    Reviewed: Allergy & Precautions, H&P , NPO status , Patient's Chart, lab work & pertinent test results  History of Anesthesia Complications Negative for: history of anesthetic complications  Airway Mallampati: III  TM Distance: <3 FB Neck ROM: limited    Dental  (+) Poor Dentition, Chipped, Missing   Pulmonary neg pulmonary ROS, neg shortness of breath,           Cardiovascular Exercise Tolerance: Good hypertension, (-) angina(-) Past MI and (-) DOE      Neuro/Psych  Headaches, PSYCHIATRIC DISORDERS Anxiety Depression    GI/Hepatic Neg liver ROS, GERD  Medicated and Controlled,  Endo/Other  negative endocrine ROS  Renal/GU CRFRenal disease  negative genitourinary   Musculoskeletal   Abdominal   Peds  Hematology negative hematology ROS (+)   Anesthesia Other Findings Past Medical History: No date: Adenomatous polyps No date: Anxiety No date: Chronic constipation No date: Chronic tension headaches No date: CKD (chronic kidney disease) stage 3, GFR 30-59 ml/min No date: Depression No date: Factitious disorder No date: GERD (gastroesophageal reflux disease) No date: Hyperlipidemia No date: Hypertension No date: Intellectual disability due to developmental disorder,  unspecified No date: Mental retardation No date: Microscopic hematuria No date: Migraine No date: Obesity  Past Surgical History: No date: CARPAL TUNNEL RELEASE 11/15/2015: CYSTOSCOPY W/ RETROGRADES; Bilateral     Comment:  Procedure: CYSTOSCOPY WITH RETROGRADE PYELOGRAM;                Surgeon: Hollice Espy, MD;  Location: [facility] ORS;                Service: Urology;  Laterality: Bilateral; 2012: US ECHOCARDIOGRAPHY     Comment:  showed mild heart failure (LVEF 52%); small pericardial               effusion  BMI    Body Mass Index:  41.99 kg/m       Reproductive/Obstetrics negative OB ROS                             Anesthesia Physical Anesthesia Plan  ASA: III  Anesthesia Plan: General   Post-op Pain Management:    Induction: Intravenous  PONV Risk Score and Plan:   Airway Management Planned: Natural Airway and Nasal Cannula  Additional Equipment:   Intra-op Plan:   Post-operative Plan:   Informed Consent: I have reviewed the patients History and Physical, chart, labs and discussed the procedure including the risks, benefits and alternatives for the proposed anesthesia with the patient or authorized representative who has indicated his/her understanding and acceptance.   Dental Advisory Given  Plan Discussed with: Anesthesiologist, CRNA and Surgeon  Anesthesia Plan Comments: (Patient and mother consented  Patient consented for risks of anesthesia including but not limited to:  - adverse reactions to medications - risk of intubation if required - damage to teeth, lips or other oral mucosa - sore throat or hoarseness - Damage to heart, brain, lungs or loss of life  Patient voiced understanding.)        Anesthesia Quick Evaluation

## 2017-06-29 NOTE — Anesthesia Postprocedure Evaluation (Signed)
Anesthesia Post Note  Patient: DEAIRRA HALLECK  Procedure(s) Performed: Procedure(s) (LRB): COLONOSCOPY WITH PROPOFOL (N/A)  Patient location during evaluation: Endoscopy Anesthesia Type: General Level of consciousness: awake and alert Pain management: pain level controlled Vital Signs Assessment: post-procedure vital signs reviewed and stable Respiratory status: spontaneous breathing, nonlabored ventilation, respiratory function stable and patient connected to nasal cannula oxygen Cardiovascular status: blood pressure returned to baseline and stable Postop Assessment: no signs of nausea or vomiting Anesthetic complications: no     Last Vitals:  Vitals:   06/27/17 1321 06/27/17 1331  BP: 113/76 132/81  Pulse: 73 71  Resp: 14 15  Temp:      Last Pain:  Vitals:   06/27/17 1301  TempSrc: Tympanic                 Precious Haws Piscitello

## 2017-06-30 ENCOUNTER — Encounter: Payer: Self-pay | Admitting: Unknown Physician Specialty

## 2017-06-30 LAB — SURGICAL PATHOLOGY

## 2017-07-02 ENCOUNTER — Ambulatory Visit (INDEPENDENT_AMBULATORY_CARE_PROVIDER_SITE_OTHER): Payer: Medicare Other

## 2017-07-02 VITALS — BP 128/74 | HR 91 | Temp 98.3°F | Resp 16 | Ht 59.0 in | Wt 219.4 lb

## 2017-07-02 DIAGNOSIS — Z0000 Encounter for general adult medical examination without abnormal findings: Secondary | ICD-10-CM | POA: Diagnosis not present

## 2017-07-02 NOTE — Patient Instructions (Signed)
Rita [name] , Thank you for taking time to come for your Medicare Wellness Visit. I appreciate your ongoing commitment to your health goals. Please review the following plan we discussed and let me know if I can assist you in the future.   Screening recommendations/referrals: Colonoscopy: Completed 06/27/2017 Mammogram: Due now Bone Density: Due at age 56 Recommended yearly ophthalmology/optometry visit for glaucoma screening and checkup Recommended yearly dental visit for hygiene and checkup  Vaccinations: Influenza vaccine: Up to date, due 08/2017 Pneumococcal vaccine: due at age 45 Tdap vaccine: up to date, due 10/2018 Shingles vaccine: due at age 30   Advanced directives: on file, no changes requested   Conditions/risks identified: none  Next appointment: Follow up on 07/07/2017 at 9 am with Kathrine Haddock, NP. Follow up in one year for your annual wellness exam.   Preventive Care 40-64 Years, Female Preventive care refers to lifestyle choices and visits with your health care provider that can promote health and wellness. What does preventive care include?  A yearly physical exam. This is also called an annual well check.  Dental exams once or twice a year.  Routine eye exams. Ask your health care provider how often you should have your eyes checked.  Personal lifestyle choices, including:  Daily care of your teeth and gums.  Regular physical activity.  Eating a healthy diet.  Avoiding tobacco and drug use.  Limiting alcohol use.  Practicing safe sex.  Taking low-dose aspirin daily starting at age 60.  Taking vitamin and mineral supplements as recommended by your health care provider. What happens during an annual well check? The services and screenings done by your health care provider during your annual well check will depend on your age, overall health, lifestyle risk factors, and family history of disease. Counseling  Your health care provider may ask you  questions about your:  Alcohol use.  Tobacco use.  Drug use.  Emotional well-being.  Home and relationship well-being.  Sexual activity.  Eating habits.  Work and work Statistician.  Method of birth control.  Menstrual cycle.  Pregnancy history. Screening  You may have the following tests or measurements:  Height, weight, and BMI.  Blood pressure.  Lipid and cholesterol levels. These may be checked every 5 years, or more frequently if you are over 4 years old.  Skin check.  Lung cancer screening. You may have this screening every year starting at age 34 if you have a 30-pack-year history of smoking and currently smoke or have quit within the past 15 years.  Fecal occult blood test (FOBT) of the stool. You may have this test every year starting at age 16.  Flexible sigmoidoscopy or colonoscopy. You may have a sigmoidoscopy every 5 years or a colonoscopy every 10 years starting at age 35.  Hepatitis C blood test.  Hepatitis B blood test.  Sexually transmitted disease (STD) testing.  Diabetes screening. This is done by checking your blood sugar (glucose) after you have not eaten for a while (fasting). You may have this done every 1-3 years.  Mammogram. This may be done every 1-2 years. Talk to your health care provider about when you should start having regular mammograms. This may depend on whether you have a family history of breast cancer.  BRCA-related cancer screening. This may be done if you have a family history of breast, ovarian, tubal, or peritoneal cancers.  Pelvic exam and Pap test. This may be done every 3 years starting at age 41. Starting at age  30, this may be done every 5 years if you have a Pap test in combination with an HPV test.  Bone density scan. This is done to screen for osteoporosis. You may have this scan if you are at high risk for osteoporosis. Discuss your test results, treatment options, and if necessary, the need for more tests with  your health care provider. Vaccines  Your health care provider may recommend certain vaccines, such as:  Influenza vaccine. This is recommended every year.  Tetanus, diphtheria, and acellular pertussis (Tdap, Td) vaccine. You may need a Td booster every 10 years.  Zoster vaccine. You may need this after age 5.  Pneumococcal 13-valent conjugate (PCV13) vaccine. You may need this if you have certain conditions and were not previously vaccinated.  Pneumococcal polysaccharide (PPSV23) vaccine. You may need one or two doses if you smoke cigarettes or if you have certain conditions. Talk to your health care provider about which screenings and vaccines you need and how often you need them. This information is not intended to replace advice given to you by your health care provider. Make sure you discuss any questions you have with your health care provider. Document Released: 12/15/2015 Document Revised: 08/07/2016 Document Reviewed: 09/19/2015 Elsevier Interactive Patient Education  2017 [place] Prevention in the Home Falls can cause injuries. They can happen to people of all ages. There are many things you can do to make your home safe and to help prevent falls. What can I do on the outside of my home?  Regularly fix the edges of walkways and driveways and fix any cracks.  Remove anything that might make you trip as you walk through a door, such as a raised step or threshold.  Trim any bushes or trees on the path to your home.  Use bright outdoor lighting.  Clear any walking paths of anything that might make someone trip, such as rocks or tools.  Regularly check to see if handrails are loose or broken. Make sure that both sides of any steps have handrails.  Any raised decks and porches should have guardrails on the edges.  Have any leaves, snow, or ice cleared regularly.  Use sand or salt on walking paths during winter.  Clean up any spills in your garage right  away. This includes oil or grease spills. What can I do in the bathroom?  Use night lights.  Install grab bars by the toilet and in the tub and shower. Do not use towel bars as grab bars.  Use non-skid mats or decals in the tub or shower.  If you need to sit down in the shower, use a plastic, non-slip stool.  Keep the floor dry. Clean up any water that spills on the floor as soon as it happens.  Remove soap buildup in the tub or shower regularly.  Attach bath mats securely with double-sided non-slip rug tape.  Do not have throw rugs and other things on the floor that can make you trip. What can I do in the bedroom?  Use night lights.  Make sure that you have a light by your bed that is easy to reach.  Do not use any sheets or blankets that are too big for your bed. They should not hang down onto the floor.  Have a firm chair that has side arms. You can use this for support while you get dressed.  Do not have throw rugs and other things on the floor that can  make you trip. What can I do in the kitchen?  Clean up any spills right away.  Avoid walking on wet floors.  Keep items that you use a lot in easy-to-reach places.  If you need to reach something above you, use a strong step stool that has a grab bar.  Keep electrical cords out of the way.  Do not use floor polish or wax that makes floors slippery. If you must use wax, use non-skid floor wax.  Do not have throw rugs and other things on the floor that can make you trip. What can I do with my stairs?  Do not leave any items on the stairs.  Make sure that there are handrails on both sides of the stairs and use them. Fix handrails that are broken or loose. Make sure that handrails are as long as the stairways.  Check any carpeting to make sure that it is firmly attached to the stairs. Fix any carpet that is loose or worn.  Avoid having throw rugs at the top or bottom of the stairs. If you do have throw rugs, attach  them to the floor with carpet tape.  Make sure that you have a light switch at the top of the stairs and the bottom of the stairs. If you do not have them, ask someone to add them for you. What else can I do to help prevent falls?  Wear shoes that:  Do not have high heels.  Have rubber bottoms.  Are comfortable and fit you well.  Are closed at the toe. Do not wear sandals.  If you use a stepladder:  Make sure that it is fully opened. Do not climb a closed stepladder.  Make sure that both sides of the stepladder are locked into place.  Ask someone to hold it for you, if possible.  Clearly mark and make sure that you can see:  Any grab bars or handrails.  First and last steps.  Where the edge of each step is.  Use tools that help you move around (mobility aids) if they are needed. These include:  Canes.  Walkers.  Scooters.  Crutches.  Turn on the lights when you go into a dark area. Replace any light bulbs as soon as they burn out.  Set up your furniture so you have a clear path. Avoid moving your furniture around.  If any of your floors are uneven, fix them.  If there are any pets around you, be aware of where they are.  Review your medicines with your doctor. Some medicines can make you feel dizzy. This can increase your chance of falling. Ask your doctor what other things that you can do to help prevent falls. This information is not intended to replace advice given to you by your health care provider. Make sure you discuss any questions you have with your health care provider. Document Released: 09/14/2009 Document Revised: 04/25/2016 Document Reviewed: 12/23/2014 Elsevier Interactive Patient Education  2017 [employer].

## 2017-07-02 NOTE — Progress Notes (Signed)
41

## 2017-07-02 NOTE — Progress Notes (Signed)
Subjective:   Rita [name] is a 56 y.o. female who presents for Medicare Annual (Subsequent) preventive examination.  Review of Systems:  Cardiac Risk Factors include: dyslipidemia;hypertension;obesity (BMI >30kg/m2)     Objective:     Vitals: BP 128/74 (BP Location: Left Arm, Patient Position: Sitting)   Pulse 91   Temp 98.3 F (36.8 C)   Resp 16   Ht 4\' 11"  (1.499 m)   Wt 219 lb 6.4 oz (99.5 kg)   LMP  (LMP Unknown)   BMI 44.31 kg/m   Body mass index is 44.31 kg/m.   Tobacco History  Smoking Status  . Never Smoker  Smokeless Tobacco  . Never Used     Counseling given: Not Answered   Past Medical History:  Diagnosis Date  . Adenomatous polyps   . Anxiety   . Chronic constipation   . Chronic tension headaches   . CKD (chronic kidney disease) stage 3, GFR 30-59 ml/min   . Depression   . Factitious disorder   . GERD (gastroesophageal reflux disease)   . Hyperlipidemia   . Hypertension   . Intellectual disability due to developmental disorder, unspecified   . Mental retardation   . Microscopic hematuria   . Migraine   . Obesity    Past Surgical History:  Procedure Laterality Date  . CARPAL TUNNEL RELEASE    . COLONOSCOPY WITH PROPOFOL N/A 06/27/2017   Procedure: COLONOSCOPY WITH PROPOFOL;  Surgeon: Manya Silvas, MD;  Location: [facility];  Service: Endoscopy;  Laterality: N/A;  . CYSTOSCOPY W/ RETROGRADES Bilateral 11/15/2015   Procedure: CYSTOSCOPY WITH RETROGRADE PYELOGRAM;  Surgeon: Hollice Espy, MD;  Location: [facility] ORS;  Service: Urology;  Laterality: Bilateral;  . US ECHOCARDIOGRAPHY  2012   showed mild heart failure (LVEF 52%); small pericardial effusion   Family History  Problem Relation Age of Onset  . Heart disease Father   . Hyperlipidemia Father   . Kidney disease Father   . Prostate cancer Neg Hx    History  Sexual Activity  . Sexual activity: No    Outpatient Encounter Prescriptions as of 07/02/2017  Medication Sig  .  acetaminophen (TYLENOL) 500 MG tablet Take 2 tablets (1,000 mg total) by mouth every 6 (six) hours as needed.  Marland Kitchen aspirin (ASPIRIN LOW DOSE) 81 MG EC tablet Take 1 tablet (81 mg total) by mouth daily. Swallow whole.  . cholecalciferol (VITAMIN D) 1000 units tablet Take 1 tablet (1,000 Units total) by mouth daily.  . clonazePAM (KLONOPIN) 0.5 MG tablet Take 1 tablet (0.5 mg total) by mouth 2 (two) times daily.  Marland Kitchen docusate sodium (COLACE) 100 MG capsule Take 1 capsule (100 mg total) by mouth 2 (two) times daily as needed for mild constipation.  . DULoxetine (CYMBALTA) 30 MG capsule Take 1 capsule (30 mg total) by mouth at bedtime.  . DULoxetine (CYMBALTA) 60 MG capsule TAKE (1) CAPSULE BY MOUTH ONCE DAILY.  . hydrocortisone cream 1 % Apply 1 application topically 2 (two) times daily.  Marland Kitchen lisinopril (PRINIVIL,ZESTRIL) 5 MG tablet Take 1 tablet (5 mg total) by mouth daily.  . magnesium oxide (MAGNESIUM-OXIDE) 400 (241.3 Mg) MG tablet Take 1 tablet (400 mg total) by mouth daily.  Marland Kitchen neomycin-bacitracin-polymyxin (NEOSPORIN) [phone] ointment Apply 1 application topically daily as needed.  Marland Kitchen QUEtiapine (SEROQUEL) 100 MG tablet Take 1 tablet (100 mg total) by mouth at bedtime.  . ranitidine (ZANTAC) 150 MG tablet Take 1 tablet (150 mg total) by mouth as needed.  Marland Kitchen  simvastatin (ZOCOR) 40 MG tablet Take 1 tablet (40 mg total) by mouth at bedtime.  . SUMAtriptan (IMITREX) 50 MG tablet Take 50 mg by mouth once a week.  . traZODone (DESYREL) 100 MG tablet Take 1 tablet (100 mg total) by mouth at bedtime.  Marland Kitchen dextromethorphan (DELSYM) 30 MG/5ML liquid Take 2.5 mLs (15 mg total) by mouth as needed for cough. (Patient not taking: Reported on 07/02/2017)  . Multiple Vitamins-Minerals (MULTIVITAMIN) tablet Take 1 tablet by mouth daily.   No facility-administered encounter medications on file as of 07/02/2017.     Activities of Daily Living In your present state of health, do you have any difficulty performing the  following activities: 07/02/2017 07/15/2016  Hearing? N N  Vision? N N  Difficulty concentrating or making decisions? N N  Walking or climbing stairs? N N  Dressing or bathing? N N  Doing errands, shopping? N Y  Conservation officer, nature and eating ? N -  Using the Toilet? N -  In the past six months, have you accidently leaked urine? Y -  Comment depends occasionally d/t kidney problems  -  Do you have problems with loss of bowel control? N -  Managing your Medications? N -  Managing your Finances? Y -  Comment mother does finances -  Runner, broadcasting/film/video? N -  Some recent data might be hidden    Patient Care Team: Kathrine Haddock, NP as PCP - General (Nurse Practitioner) Kathrine Haddock, NP as PCP - Family Medicine (Nurse Practitioner) Anabel Bene, MD as Referring Physician (Neurology) Clapacs, Madie Reno, MD (Psychiatry) Anthonette Legato, MD (Internal Medicine)    Assessment:     Exercise Activities and Dietary recommendations Current Exercise Habits: Home exercise routine, Type of exercise: walking, Time (Minutes): 30, Frequency (Times/Week): 3, Weekly Exercise (Minutes/Week): 90, Intensity: Mild  Goals    None     Fall Risk Fall Risk  07/02/2017 07/15/2016 07/05/2015  Falls in the past year? No No No   Depression Screen PHQ 2/9 Scores 07/02/2017 07/15/2016 04/08/2016 07/05/2015  PHQ - 2 Score 0 0 0 0     Cognitive Function     6CIT Screen 07/02/2017  What Year? 0 points  What month? 0 points  What time? 0 points  Count back from 20 2 points  Months in reverse 0 points  Repeat phrase 2 points  Total Score 4    Immunization History  Administered Date(s) Administered  . Influenza,inj,Quad PF,36+ Mos 09/20/2015, 10/14/2016  . Influenza-Unspecified 08/26/2014  . Td 10/20/2008   Screening Tests Health Maintenance  Topic Date Due  . INFLUENZA VACCINE  07/02/2017  . MAMMOGRAM  07/02/2018 (Originally 04/12/2016)  . PAP SMEAR  02/21/2020 (Originally 02/21/2017)    . TETANUS/TDAP  10/20/2018  . COLONOSCOPY  06/27/2022  . Hepatitis C Screening  Completed  . HIV Screening  Completed      Plan:     I have personally reviewed and addressed the Medicare Annual Wellness questionnaire and have noted the following in the patient's chart:  A. Medical and social history B. Use of alcohol, tobacco or illicit drugs  C. Current medications and supplements D. Functional ability and status E.  Nutritional status F.  Physical activity G. Advance directives H. List of other physicians I.  Hospitalizations, surgeries, and ER visits in previous 12 months J.  [place] such as hearing and vision if needed, cognitive and depression L. Referrals and appointments   In addition, I have reviewed  and discussed with patient certain preventive protocols, quality metrics, and best practice recommendations. A written personalized care plan for preventive services as well as general preventive health recommendations were provided to patient.   Signed,  Tyler Aas, LPN Nurse Health Advisor   MD Recommendations: Patient declined Mammogram d/t discomfort.

## 2017-07-07 ENCOUNTER — Encounter: Payer: Self-pay | Admitting: Unknown Physician Specialty

## 2017-07-07 ENCOUNTER — Ambulatory Visit (INDEPENDENT_AMBULATORY_CARE_PROVIDER_SITE_OTHER): Payer: Medicare Other | Admitting: Unknown Physician Specialty

## 2017-07-07 VITALS — BP 119/82 | HR 93 | Temp 98.1°F | Wt 216.6 lb

## 2017-07-07 DIAGNOSIS — I10 Essential (primary) hypertension: Secondary | ICD-10-CM

## 2017-07-07 DIAGNOSIS — E782 Mixed hyperlipidemia: Secondary | ICD-10-CM | POA: Diagnosis not present

## 2017-07-07 DIAGNOSIS — N183 Chronic kidney disease, stage 3 unspecified: Secondary | ICD-10-CM

## 2017-07-07 DIAGNOSIS — R5383 Other fatigue: Secondary | ICD-10-CM

## 2017-07-07 NOTE — Assessment & Plan Note (Addendum)
Seeing Nephrology.  Check CMP

## 2017-07-07 NOTE — Assessment & Plan Note (Signed)
Stable, continue present medications.   

## 2017-07-07 NOTE — Progress Notes (Signed)
   BP 119/82   Pulse 93   Temp 98.1 F (36.7 C)   Wt 216 lb 9.6 oz (98.2 kg)   LMP  (LMP Unknown)   SpO2 97%   BMI 43.75 kg/m    Subjective:    Patient ID: Rita [name], female    DOB: [birth year], 56 y.o.   MRN: [id number]  HPI: Rita [name] is a 56 y.o. female  Chief Complaint  Patient presents with  . Hypertension    6 month f/up  . Hyperlipidemia    6 month f/up   Hypertension Using medications without difficulty Average home BPs Not checking  No problems or lightheadedness No chest pain with exertion or shortness of breath No Edema   Hyperlipidemia Using medications without problems: No Muscle aches  Diet compliance: working on cutting back Exercise: walking sometimes  Fatigue Finds she is tired and gaining weight  Relevant past medical, surgical, family and social history reviewed and updated as indicated. Interim medical history since our last visit reviewed. Allergies and medications reviewed and updated.  Review of Systems  Per HPI unless specifically indicated above     Objective:    BP 119/82   Pulse 93   Temp 98.1 F (36.7 C)   Wt 216 lb 9.6 oz (98.2 kg)   LMP  (LMP Unknown)   SpO2 97%   BMI 43.75 kg/m   Wt Readings from Last 3 Encounters:  07/07/17 216 lb 9.6 oz (98.2 kg)  07/02/17 219 lb 6.4 oz (99.5 kg)  06/27/17 215 lb (97.5 kg)    Physical Exam  Constitutional: She is oriented to person, place, and time. She appears well-developed and well-nourished. No distress.  HENT:  Head: Normocephalic and atraumatic.  Eyes: Conjunctivae and lids are normal. Right eye exhibits no discharge. Left eye exhibits no discharge. No scleral icterus.  Neck: Normal range of motion. Neck supple. No JVD present. Carotid bruit is not present.  Cardiovascular: Normal rate, regular rhythm and normal heart sounds.   Pulmonary/Chest: Effort normal and breath sounds normal.  Abdominal: Normal appearance. There is no splenomegaly or hepatomegaly.    Musculoskeletal: Normal range of motion.  Neurological: She is alert and oriented to person, place, and time.  Skin: Skin is warm, dry and intact. No rash noted. No pallor.  Psychiatric: She has a normal mood and affect. Her behavior is normal. Judgment and thought content normal.      Assessment & Plan:   Problem List Items Addressed This Visit      Unprioritized   BP (high blood pressure)    Stable, continue present medications.        Chronic kidney disease, stage 3    Seeing Nephrology.  Check CMP      Hyperlipidemia    Stable, continue present medications.         Other Visit Diagnoses    Fatigue, unspecified type    -  Primary   Relevant Orders   TSH   CBC with Differential/Platelet       Follow up plan: Return in about 6 months (around 01/07/2018).

## 2017-07-08 ENCOUNTER — Encounter: Payer: Self-pay | Admitting: Unknown Physician Specialty

## 2017-07-08 LAB — TSH: TSH: 2.01 u[IU]/mL (ref 0.450–4.500)

## 2017-07-08 LAB — CBC WITH DIFFERENTIAL/PLATELET
BASOS: 0 %
Basophils Absolute: 0 10*3/uL (ref 0.0–0.2)
EOS (ABSOLUTE): 0.3 10*3/uL (ref 0.0–0.4)
EOS: 3 %
HEMATOCRIT: 39.4 % (ref 34.0–46.6)
Hemoglobin: 12.8 g/dL (ref 11.1–15.9)
Immature Grans (Abs): 0 10*3/uL (ref 0.0–0.1)
Immature Granulocytes: 0 %
LYMPHS ABS: 2.4 10*3/uL (ref 0.7–3.1)
Lymphs: 29 %
MCH: 29.8 pg (ref 26.6–33.0)
MCHC: 32.5 g/dL (ref 31.5–35.7)
MCV: 92 fL (ref 79–97)
MONOS ABS: 0.5 10*3/uL (ref 0.1–0.9)
Monocytes: 6 %
Neutrophils Absolute: 5.2 10*3/uL (ref 1.4–7.0)
Neutrophils: 62 %
Platelets: 272 10*3/uL (ref 150–379)
RBC: 4.3 x10E6/uL (ref 3.77–5.28)
RDW: 14.7 % (ref 12.3–15.4)
WBC: 8.4 10*3/uL (ref 3.4–10.8)

## 2017-07-16 ENCOUNTER — Encounter: Payer: Self-pay | Admitting: Unknown Physician Specialty

## 2017-07-28 ENCOUNTER — Encounter: Payer: Medicare Other | Admitting: Unknown Physician Specialty

## 2017-08-26 ENCOUNTER — Telehealth: Payer: Self-pay | Admitting: Unknown Physician Specialty

## 2017-08-26 NOTE — Telephone Encounter (Signed)
Rita [name], have you heard of this new medication?

## 2017-08-26 NOTE — Telephone Encounter (Signed)
I do not know this medication.  Rita [name] should get any headache treatment through neurology

## 2017-08-26 NOTE — Telephone Encounter (Signed)
Would like a presctiption for new medication on market for migraines : Relafpemn porpable Clocker (New headache medication). Patient has been taking tylenol and would like to try something else   Please Advise.  Thank you

## 2017-09-25 ENCOUNTER — Ambulatory Visit: Payer: Medicare Other

## 2017-09-26 ENCOUNTER — Telehealth: Payer: Self-pay

## 2017-09-26 ENCOUNTER — Ambulatory Visit (INDEPENDENT_AMBULATORY_CARE_PROVIDER_SITE_OTHER): Payer: Medicare Other

## 2017-09-26 DIAGNOSIS — Z23 Encounter for immunization: Secondary | ICD-10-CM

## 2017-09-26 DIAGNOSIS — I10 Essential (primary) hypertension: Secondary | ICD-10-CM | POA: Diagnosis not present

## 2017-09-26 DIAGNOSIS — R7301 Impaired fasting glucose: Secondary | ICD-10-CM | POA: Diagnosis not present

## 2017-09-26 DIAGNOSIS — F250 Schizoaffective disorder, bipolar type: Secondary | ICD-10-CM | POA: Diagnosis not present

## 2017-09-26 DIAGNOSIS — E782 Mixed hyperlipidemia: Secondary | ICD-10-CM | POA: Diagnosis not present

## 2017-09-26 DIAGNOSIS — F3341 Major depressive disorder, recurrent, in partial remission: Secondary | ICD-10-CM | POA: Diagnosis not present

## 2017-09-26 NOTE — Telephone Encounter (Signed)
Not sure what that medication is.

## 2017-09-26 NOTE — Telephone Encounter (Signed)
While in office for flu vaccine. Pt stated that she has been having headaches about 2x weekly, they present with sensitivity to light and noise. They are not resolved with OTC medications. Pt stated she had to miss school yesterday due to headache. Pt would like to know if she could get an RX for  Relaspen. She would like to be reached at [phone] (her parents house)

## 2017-09-29 NOTE — Telephone Encounter (Signed)
Called the phone number the patient asked to be called back on and her father answered. He stated that the patient was not here and asked that I call the patient's caregiver, [name]. I did this and she states that Harvey's neurologist has not given the patient anything for this and asked for [place] to just disregard this. She states that the neurologist can not find anything to show that the patient does have headaches.

## 2017-10-28 NOTE — Telephone Encounter (Signed)
Patient's caregiver notified.

## 2017-10-28 NOTE — Telephone Encounter (Signed)
Pt has called back wanting to know if something can be called in for headaches

## 2017-10-28 NOTE — Telephone Encounter (Signed)
Routing to provider  

## 2017-10-28 NOTE — Telephone Encounter (Signed)
Please let her know I am sorry but I cannot

## 2017-12-19 ENCOUNTER — Telehealth: Payer: Self-pay

## 2017-12-19 NOTE — Telephone Encounter (Signed)
Called pt. To get an appt. Scheduled. Left message to give office a call back in regards to scheduling appt.

## 2017-12-19 NOTE — Telephone Encounter (Signed)
Please call and schedule the patient an appointment with [name].

## 2017-12-19 NOTE — Telephone Encounter (Signed)
Copied from [place] [id number]. Topic: Referral - Request >> Dec 18, 2017  1:58 PM Carolyn Stare wrote:  Pt call to ask for a referral to see a podiatrist for fungus on her feet    Routing to provider. Would patient need to be seen here first?

## 2017-12-19 NOTE — Telephone Encounter (Signed)
It depends on her insurance.  We can probably treat this here

## 2017-12-22 ENCOUNTER — Other Ambulatory Visit: Payer: Self-pay

## 2017-12-22 ENCOUNTER — Ambulatory Visit (INDEPENDENT_AMBULATORY_CARE_PROVIDER_SITE_OTHER): Payer: Medicare Other | Admitting: Psychiatry

## 2017-12-22 ENCOUNTER — Encounter: Payer: Self-pay | Admitting: Psychiatry

## 2017-12-22 VITALS — BP 121/79 | HR 114 | Temp 97.6°F | Wt 207.2 lb

## 2017-12-22 DIAGNOSIS — F259 Schizoaffective disorder, unspecified: Secondary | ICD-10-CM

## 2017-12-22 DIAGNOSIS — F411 Generalized anxiety disorder: Secondary | ICD-10-CM | POA: Diagnosis not present

## 2017-12-22 DIAGNOSIS — F819 Developmental disorder of scholastic skills, unspecified: Secondary | ICD-10-CM | POA: Diagnosis not present

## 2017-12-22 DIAGNOSIS — F70 Mild intellectual disabilities: Secondary | ICD-10-CM | POA: Diagnosis not present

## 2017-12-22 MED ORDER — CLONAZEPAM 0.5 MG PO TABS: 0.5000 mg | ORAL_TABLET | Freq: Two times a day (BID) | ORAL | 5 refills | Status: DC

## 2017-12-22 MED ORDER — TRAZODONE HCL 100 MG PO TABS: 200.0000 mg | ORAL_TABLET | Freq: Every day | ORAL | 5 refills | Status: DC

## 2017-12-22 MED ORDER — DULOXETINE HCL 30 MG PO CPEP: 30.0000 mg | ORAL_CAPSULE | Freq: Every day | ORAL | 5 refills | Status: DC

## 2017-12-22 MED ORDER — QUETIAPINE FUMARATE 100 MG PO TABS: 100.0000 mg | ORAL_TABLET | Freq: Every day | ORAL | 5 refills | Status: DC

## 2017-12-22 MED ORDER — DULOXETINE HCL 60 MG PO CPEP: ORAL_CAPSULE | ORAL | 5 refills | Status: DC

## 2017-12-22 NOTE — Progress Notes (Signed)
Follow-up note for 57 year old woman with chronic mental health problems behavior problems and intellectual disability.  She came to see me today and as usual was accompanied by the owner of her group home.  The patient also had written out a letter which she gave me prior to the interview, A, and technique she has to try and transmitted information.  The letter will be put in the chart but as I have seen in the past it has a lot of talk about "hurting herself" with diet pills anxiety and worry and tends to escalate in its emotion as it goes along.  Patient in the interview by contrast was calm and very appropriate.  Affect smiling euthymic reactive pretty much her baseline.  Thoughts of course as usual fairly concrete and simple.  She does talk about getting depressed and worrying a lot.  The group home owner was able to help interpret this by telling me that Rita [name] has been worried about her mother who recently was sick.  This seems to be getting better.  Patient also was able to admit that she actually has a lot of positive things in her life that she continues to enjoy.  We finally narrowed down some of her complaints to feeling like she still cannot sleep well at night.  Neatly dressed and groomed.  Decreased eye contact.  Concrete thinking.  Alert and oriented however.  No evidence of acute psychosis.  Patient admits no intent to kill herself and does not have any means to do so anyway.  Rita [name] is already on 90 mg a day of Cymbalta which I think has been helpful for her.  A lot of her complaints of depression and suicidality are chronic things and are manipulative.  As I pointed out to her, I know that when we have admitted her to the hospital in the past it has not only not helped but made things worse.  I do not think there is really a significant chance of the patient harming herself and I think overall she is probably at her usual level of happiness.  I reviewed medicines with her and did agree to try  increasing trazodone to 200 mg at night as a safe way to try and help her sleep better.  She was agreeable to this plan.  Orders will be done and I will see her back in 6 months.

## 2017-12-26 ENCOUNTER — Other Ambulatory Visit: Payer: Self-pay | Admitting: Unknown Physician Specialty

## 2017-12-27 ENCOUNTER — Other Ambulatory Visit: Payer: Self-pay | Admitting: Family Medicine

## 2017-12-29 NOTE — Telephone Encounter (Signed)
Your patient 

## 2018-01-09 ENCOUNTER — Other Ambulatory Visit: Payer: Self-pay | Admitting: Unknown Physician Specialty

## 2018-01-09 ENCOUNTER — Encounter: Payer: Self-pay | Admitting: Unknown Physician Specialty

## 2018-01-09 ENCOUNTER — Ambulatory Visit (INDEPENDENT_AMBULATORY_CARE_PROVIDER_SITE_OTHER): Payer: Medicare Other | Admitting: Unknown Physician Specialty

## 2018-01-09 VITALS — BP 109/74 | HR 97 | Temp 98.3°F | Wt 206.2 lb

## 2018-01-09 DIAGNOSIS — F3341 Major depressive disorder, recurrent, in partial remission: Secondary | ICD-10-CM

## 2018-01-09 DIAGNOSIS — F250 Schizoaffective disorder, bipolar type: Secondary | ICD-10-CM

## 2018-01-09 DIAGNOSIS — R7301 Impaired fasting glucose: Secondary | ICD-10-CM | POA: Insufficient documentation

## 2018-01-09 DIAGNOSIS — I10 Essential (primary) hypertension: Secondary | ICD-10-CM | POA: Diagnosis not present

## 2018-01-09 DIAGNOSIS — L608 Other nail disorders: Secondary | ICD-10-CM | POA: Diagnosis not present

## 2018-01-09 DIAGNOSIS — E782 Mixed hyperlipidemia: Secondary | ICD-10-CM

## 2018-01-09 MED ORDER — SIMVASTATIN 40 MG PO TABS: 40.0000 mg | ORAL_TABLET | Freq: Every day | ORAL | 12 refills | Status: DC

## 2018-01-09 MED ORDER — THERA VITAL M PO TABS: 1.0000 | ORAL_TABLET | Freq: Every day | ORAL | 3 refills | Status: DC

## 2018-01-09 MED ORDER — RANITIDINE HCL 150 MG PO TABS: 150.0000 mg | ORAL_TABLET | ORAL | 3 refills | Status: DC | PRN

## 2018-01-09 MED ORDER — HYDROCORTISONE 1 % EX CREA: 1.0000 "application " | TOPICAL_CREAM | Freq: Two times a day (BID) | CUTANEOUS | 12 refills | Status: DC

## 2018-01-09 MED ORDER — TRIPLE ANTIBIOTIC 5-400-5000 EX OINT: 1.0000 "application " | TOPICAL_OINTMENT | Freq: Every day | CUTANEOUS | 12 refills | Status: DC | PRN

## 2018-01-09 MED ORDER — VITAMIN D3 25 MCG (1000 UNIT) PO TABS: 1000.0000 [IU] | ORAL_TABLET | Freq: Every day | ORAL | 12 refills | Status: DC

## 2018-01-09 MED ORDER — SUMATRIPTAN SUCCINATE 50 MG PO TABS: 50.0000 mg | ORAL_TABLET | ORAL | 12 refills | Status: DC

## 2018-01-09 MED ORDER — MAGNESIUM OXIDE 400 (241.3 MG) MG PO TABS: 1.0000 | ORAL_TABLET | Freq: Every day | ORAL | 12 refills | Status: DC

## 2018-01-09 MED ORDER — TRAZODONE HCL 100 MG PO TABS: 200.0000 mg | ORAL_TABLET | Freq: Every day | ORAL | 5 refills | Status: DC

## 2018-01-09 MED ORDER — DEXTROMETHORPHAN POLISTIREX ER 30 MG/5ML PO SUER: 15.0000 mg | ORAL | 12 refills | Status: DC | PRN

## 2018-01-09 MED ORDER — ASPIRIN 81 MG PO TBEC: 81.0000 mg | DELAYED_RELEASE_TABLET | Freq: Every day | ORAL | 3 refills | Status: DC

## 2018-01-09 MED ORDER — ACETAMINOPHEN 500 MG PO TABS: 1000.0000 mg | ORAL_TABLET | Freq: Four times a day (QID) | ORAL | 12 refills | Status: DC | PRN

## 2018-01-09 MED ORDER — DOCUSATE SODIUM 100 MG PO CAPS: 100.0000 mg | ORAL_CAPSULE | Freq: Two times a day (BID) | ORAL | 12 refills | Status: DC | PRN

## 2018-01-09 MED ORDER — LISINOPRIL 5 MG PO TABS: 5.0000 mg | ORAL_TABLET | Freq: Every day | ORAL | 12 refills | Status: DC

## 2018-01-09 NOTE — Assessment & Plan Note (Signed)
Per Dr. Arlana Lindau

## 2018-01-09 NOTE — Assessment & Plan Note (Signed)
Check lipid panel  

## 2018-01-09 NOTE — Assessment & Plan Note (Signed)
Stable, continue present medications.   

## 2018-01-09 NOTE — Progress Notes (Signed)
BP 109/74   Pulse 97   Temp 98.3 F (36.8 C) (Oral)   Wt 206 lb 3.2 oz (93.5 kg)   LMP  (LMP Unknown)   SpO2 98%   BMI 41.65 kg/m    Subjective:    Patient ID: Rita [name], female    DOB: 1961/06/05, 57 y.o.   MRN: [id number]  HPI: Rita [name] is a 57 y.o. female  Chief Complaint  Patient presents with  . Hyperlipidemia    6 month f/up  . Hypertension    6 month f/up    Pt is here with caregiver Hypertension Using medications without difficulty Average home BPs about what it is here  No problems or lightheadedness No chest pain with exertion or shortness of breath No Edema   Hyperlipidemia Using medications without problems: No Muscle aches  Diet compliance: Lost weight since last visit.  [facility].    Toenail Misshapen toenails beginning to cause pain   Relevant past medical, surgical, family and social history reviewed and updated as indicated. Interim medical history since our last visit reviewed. Allergies and medications reviewed and updated.  Review of Systems  Constitutional: Negative.   Respiratory: Negative.   Cardiovascular: Negative.   Neurological: Positive for headaches.    Per HPI unless specifically indicated above     Objective:    BP 109/74   Pulse 97   Temp 98.3 F (36.8 C) (Oral)   Wt 206 lb 3.2 oz (93.5 kg)   LMP  (LMP Unknown)   SpO2 98%   BMI 41.65 kg/m   Wt Readings from Last 3 Encounters:  01/09/18 206 lb 3.2 oz (93.5 kg)  07/07/17 216 lb 9.6 oz (98.2 kg)  07/02/17 219 lb 6.4 oz (99.5 kg)    Physical Exam  Constitutional: She is oriented to person, place, and time. She appears well-developed and well-nourished. No distress.  HENT:  Head: Normocephalic and atraumatic.  Eyes: Conjunctivae and lids are normal. Right eye exhibits no discharge. Left eye exhibits no discharge. No scleral icterus.  Neck: Normal range of motion. Neck supple. No JVD present. Carotid bruit is not present.    Cardiovascular: Normal rate, regular rhythm and normal heart sounds.  Pulmonary/Chest: Effort normal and breath sounds normal.  Abdominal: Normal appearance. There is no splenomegaly or hepatomegaly.  Musculoskeletal: Normal range of motion.  Neurological: She is alert and oriented to person, place, and time.  Skin: Skin is warm, dry and intact. No rash noted. No pallor.  Deformed nails left foot  Psychiatric: She has a normal mood and affect. Her behavior is normal. Judgment and thought content normal.    Results for orders placed or performed in visit on 07/07/17  TSH  Result Value Ref Range   TSH 2.010 0.450 - 4.500 uIU/mL  CBC with Differential/Platelet  Result Value Ref Range   WBC 8.4 3.4 - 10.8 x10E3/uL   RBC 4.30 3.77 - 5.28 x10E6/uL   Hemoglobin 12.8 11.1 - 15.9 g/dL   Hematocrit 39.4 34.0 - 46.6 %   MCV 92 79 - 97 fL   MCH 29.8 26.6 - 33.0 pg   MCHC 32.5 31.5 - 35.7 g/dL   RDW 14.7 12.3 - 15.4 %   Platelets 272 150 - 379 x10E3/uL   Neutrophils 62 Not Estab. %   Lymphs 29 Not Estab. %   Monocytes 6 Not Estab. %   Eos 3 Not Estab. %   Basos 0  Not Estab. %   Neutrophils Absolute 5.2 1.4 - 7.0 x10E3/uL   Lymphocytes Absolute 2.4 0.7 - 3.1 x10E3/uL   Monocytes Absolute 0.5 0.1 - 0.9 x10E3/uL   EOS (ABSOLUTE) 0.3 0.0 - 0.4 x10E3/uL   Basophils Absolute 0.0 0.0 - 0.2 x10E3/uL   Immature Granulocytes 0 Not Estab. %   Immature Grans (Abs) 0.0 0.0 - 0.1 x10E3/uL      Assessment & Plan:   Problem List Items Addressed This Visit      Unprioritized   BP (high blood pressure)    Stable, continue present medications.        Relevant Orders   Comprehensive metabolic panel   Hyperlipidemia    Check lipid panel      Relevant Orders   Lipid Panel w/o Chol/HDL Ratio   IFG (impaired fasting glucose)    Check Hgb A1C.  OK last year and is losing some weight so suspect stable      Relevant Orders   Hgb A1c w/o eAG   Major depressive disorder, recurrent episode, in  partial remission ([place])    Per Dr. Arlana Lindau      Schizoaffective disorder, bipolar type [facility])    Per Dr. Arlana Lindau       Other Visit Diagnoses    Toenail deformity    -  Primary   Relevant Orders   Ambulatory referral to Podiatry       Follow up plan: Return in about 6 months (around 07/09/2018) for physical.

## 2018-01-09 NOTE — Telephone Encounter (Signed)
[name] called and stated that the pt needs a refill on all medications prescribed by [facility].

## 2018-01-09 NOTE — Telephone Encounter (Signed)
Routing to provider. Can we change medications to normal so they will go straight to the pharmacy please?

## 2018-01-09 NOTE — Assessment & Plan Note (Signed)
Check Hgb A1C.  OK last year and is losing some weight so suspect stable

## 2018-01-10 LAB — HGB A1C W/O EAG: Hgb A1c MFr Bld: 5.5 % (ref 4.8–5.6)

## 2018-01-10 LAB — LIPID PANEL W/O CHOL/HDL RATIO
CHOLESTEROL TOTAL: 240 mg/dL — AB (ref 100–199)
HDL: 43 mg/dL (ref >39)
Triglycerides: 536 mg/dL — ABNORMAL HIGH (ref 0–149)

## 2018-01-10 LAB — COMPREHENSIVE METABOLIC PANEL
A/G RATIO: 1.4 (ref 1.2–2.2)
ALBUMIN: 4.1 g/dL (ref 3.5–5.5)
ALK PHOS: 115 IU/L (ref 39–117)
ALT: 24 IU/L (ref 0–32)
AST: 22 IU/L (ref 0–40)
BUN / CREAT RATIO: 9 (ref 9–23)
BUN: 13 mg/dL (ref 6–24)
CHLORIDE: 102 mmol/L (ref 96–106)
CO2: 24 mmol/L (ref 20–29)
Calcium: 9.9 mg/dL (ref 8.7–10.2)
Creatinine, Ser: 1.44 mg/dL — ABNORMAL HIGH (ref 0.57–1.00)
GFR calc Af Amer: 47 mL/min/{1.73_m2} — ABNORMAL LOW (ref >59)
GFR calc non Af Amer: 41 mL/min/{1.73_m2} — ABNORMAL LOW (ref >59)
GLOBULIN, TOTAL: 3 g/dL (ref 1.5–4.5)
Glucose: 84 mg/dL (ref 65–99)
Potassium: 4.6 mmol/L (ref 3.5–5.2)
SODIUM: 141 mmol/L (ref 134–144)
TOTAL PROTEIN: 7.1 g/dL (ref 6.0–8.5)

## 2018-01-12 ENCOUNTER — Encounter: Payer: Self-pay | Admitting: Unknown Physician Specialty

## 2018-01-25 ENCOUNTER — Other Ambulatory Visit: Payer: Self-pay | Admitting: Unknown Physician Specialty

## 2018-02-05 ENCOUNTER — Ambulatory Visit: Payer: Self-pay | Admitting: Podiatry

## 2018-02-09 ENCOUNTER — Other Ambulatory Visit: Payer: Self-pay | Admitting: Unknown Physician Specialty

## 2018-02-22 ENCOUNTER — Other Ambulatory Visit: Payer: Self-pay | Admitting: Unknown Physician Specialty

## 2018-02-23 ENCOUNTER — Ambulatory Visit (INDEPENDENT_AMBULATORY_CARE_PROVIDER_SITE_OTHER): Payer: Medicare Other | Admitting: Podiatry

## 2018-02-23 ENCOUNTER — Encounter: Payer: Self-pay | Admitting: Podiatry

## 2018-02-23 VITALS — BP 139/89 | HR 92

## 2018-02-23 DIAGNOSIS — M79675 Pain in left toe(s): Secondary | ICD-10-CM

## 2018-02-23 DIAGNOSIS — B351 Tinea unguium: Secondary | ICD-10-CM | POA: Diagnosis not present

## 2018-02-23 DIAGNOSIS — M79674 Pain in right toe(s): Secondary | ICD-10-CM

## 2018-02-23 NOTE — Progress Notes (Addendum)
Complaint:  Visit Type: Patient returns to my office for continued preventative foot care services. Complaint: Patient states" my nails have grown long and thick and become painful to walk and wear shoes"  The patient presents for preventative foot care services. No changes to ROS  Podiatric Exam: Vascular: dorsalis pedis and posterior tibial pulses are palpable bilateral. Capillary return is immediate. Temperature gradient is WNL. Skin turgor WNL  Sensorium: Normal Semmes Weinstein monofilament test. Normal tactile sensation bilaterally. Nail Exam: Pt has thick disfigured discolored nails with subungual debris noted bilateral entire nail second through fifth toenails Ulcer Exam: There is no evidence of ulcer or pre-ulcerative changes or infection. Orthopedic Exam: Muscle tone and strength are WNL. No limitations in general ROM. No crepitus or effusions noted. Foot type and digits show no abnormalities. Bony prominences are unremarkable. Skin: No Porokeratosis. No infection or ulcers  Diagnosis:  Onychomycosis, , Pain in right toe, pain in left toes  Treatment & Plan Procedures and Treatment: Consent by patient was obtained for treatment procedures.   Debridement of mycotic and hypertrophic toenails, 2  through 5 bilateral and clearing of subungual debris. No ulceration, no infection noted. ABN signed for 2019. Return Visit-Office Procedure: Patient instructed to return to the office for a follow up visit 3 months for continued evaluation and treatment.    Gardiner Barefoot DPM

## 2018-02-25 ENCOUNTER — Other Ambulatory Visit: Payer: Self-pay

## 2018-02-25 MED ORDER — DEXTROMETHORPHAN POLISTIREX ER 30 MG/5ML PO SUER: 15.0000 mg | ORAL | 12 refills | Status: DC | PRN

## 2018-02-25 MED ORDER — ACETAMINOPHEN 500 MG PO TABS: 1000.0000 mg | ORAL_TABLET | Freq: Four times a day (QID) | ORAL | 12 refills | Status: DC | PRN

## 2018-02-25 MED ORDER — DOCUSATE SODIUM 100 MG PO CAPS: 100.0000 mg | ORAL_CAPSULE | Freq: Two times a day (BID) | ORAL | 12 refills | Status: DC | PRN

## 2018-02-25 NOTE — Telephone Encounter (Signed)
Patient last seen 01/09/18 and has f/up 07/10/18.

## 2018-02-26 ENCOUNTER — Other Ambulatory Visit: Payer: Self-pay | Admitting: Psychiatry

## 2018-02-26 ENCOUNTER — Other Ambulatory Visit: Payer: Self-pay | Admitting: Unknown Physician Specialty

## 2018-03-02 ENCOUNTER — Other Ambulatory Visit: Payer: Self-pay | Admitting: Unknown Physician Specialty

## 2018-03-11 ENCOUNTER — Other Ambulatory Visit: Payer: Self-pay | Admitting: Unknown Physician Specialty

## 2018-03-11 NOTE — Telephone Encounter (Signed)
Copied from [place] [id number]. Topic: Quick Communication - Rx Refill/Question >> Mar 11, 2018  4:33 PM Waylan Rocher, [place] L wrote: Medication: topamax (for weight loss) Has the patient contacted their pharmacy? Yes.   (Agent: If no, request that the patient contact the pharmacy for the refill.) Preferred Pharmacy (with phone number or street name): [place], [place] - [address] [place] [address] Phone: [phone] Fax: [phone] Agent: Please be advised that RX refills may take up to 3 business days. We ask that you follow-up with your pharmacy.

## 2018-03-12 ENCOUNTER — Telehealth: Payer: Self-pay | Admitting: Unknown Physician Specialty

## 2018-03-12 NOTE — Telephone Encounter (Signed)
Received call from patients "caregiver." Caller requested to know why patient contacted our office. Caller was not listed on DPR but stated that she has POA.  Explained to caller that I was unable to provide information without approval. n

## 2018-03-12 NOTE — Telephone Encounter (Signed)
This is not indicated for weight loss.  We previously gave for migraines.  If her neurologist feels it is indicated for this they can rx

## 2018-03-12 NOTE — Telephone Encounter (Signed)
Called and left patient a VM letting her know what [name] said.

## 2018-03-13 NOTE — Telephone Encounter (Signed)
Rita [name] the caregiver called back this am.  I advised her per the office that no POA is on file. We are unable to give her any information.  [name] states she will bring to the office

## 2018-03-30 ENCOUNTER — Other Ambulatory Visit: Payer: Self-pay | Admitting: Unknown Physician Specialty

## 2018-04-01 ENCOUNTER — Other Ambulatory Visit: Payer: Self-pay

## 2018-04-01 MED ORDER — LISINOPRIL 5 MG PO TABS: 5.0000 mg | ORAL_TABLET | Freq: Every day | ORAL | 1 refills | Status: DC

## 2018-04-01 NOTE — Telephone Encounter (Signed)
Patient last seen 01/09/18 and has f/up 07/10/18.

## 2018-04-24 ENCOUNTER — Other Ambulatory Visit: Payer: Self-pay | Admitting: Psychiatry

## 2018-05-25 ENCOUNTER — Other Ambulatory Visit: Payer: Self-pay | Admitting: Psychiatry

## 2018-05-25 ENCOUNTER — Other Ambulatory Visit: Payer: Self-pay | Admitting: Unknown Physician Specialty

## 2018-05-28 NOTE — Telephone Encounter (Signed)
aspirin refill Last Refill:03/30/18 # 30 No RF Last OV: 01/06/17 PCP: Kathrine Haddock NP [facility]  Magnesium Oxide refill Last Refill:03/30/17 # 30 No RF Last OV: 01/09/16  simvastatin refill Last Refill:03/30/18 # 30 No RF Last OV: 01/09/18    trazodone refill Last Refill:01/09/18 # 60 5 RF Last OV: cannot find OV where drug is mentioned

## 2018-06-16 ENCOUNTER — Encounter: Payer: Self-pay | Admitting: Unknown Physician Specialty

## 2018-06-18 ENCOUNTER — Encounter: Payer: Self-pay | Admitting: Psychiatry

## 2018-06-18 ENCOUNTER — Ambulatory Visit (INDEPENDENT_AMBULATORY_CARE_PROVIDER_SITE_OTHER): Payer: Medicare Other | Admitting: Psychiatry

## 2018-06-18 VITALS — BP 120/66 | HR 93 | Ht 59.0 in | Wt 212.0 lb

## 2018-06-18 DIAGNOSIS — F259 Schizoaffective disorder, unspecified: Secondary | ICD-10-CM | POA: Diagnosis not present

## 2018-06-18 DIAGNOSIS — F411 Generalized anxiety disorder: Secondary | ICD-10-CM | POA: Diagnosis not present

## 2018-06-18 DIAGNOSIS — F819 Developmental disorder of scholastic skills, unspecified: Secondary | ICD-10-CM | POA: Diagnosis not present

## 2018-06-18 MED ORDER — TRAZODONE HCL 100 MG PO TABS: 200.0000 mg | ORAL_TABLET | Freq: Every day | ORAL | 5 refills | Status: DC

## 2018-06-18 MED ORDER — DULOXETINE HCL 30 MG PO CPEP: 30.0000 mg | ORAL_CAPSULE | Freq: Every day | ORAL | 5 refills | Status: DC

## 2018-06-18 MED ORDER — QUETIAPINE FUMARATE 100 MG PO TABS: 100.0000 mg | ORAL_TABLET | Freq: Every day | ORAL | 5 refills | Status: DC

## 2018-06-18 MED ORDER — CLONAZEPAM 0.5 MG PO TABS: 0.5000 mg | ORAL_TABLET | Freq: Two times a day (BID) | ORAL | 5 refills | Status: DC

## 2018-06-18 MED ORDER — DULOXETINE HCL 60 MG PO CPEP: ORAL_CAPSULE | ORAL | 5 refills | Status: DC

## 2018-06-18 NOTE — Progress Notes (Signed)
Follow-up patient with schizoaffective disorder anxiety and developmental delay.  Patient continues to worry about her weight.  She did have an episode where she took some "diet pills" a month or so ago.  Has passive suicidal thoughts but no actual intention or plan of wanting to act on that.  Able to articulate positive things in her life.  Overall mood pretty good.  Neatly dressed and groomed.  Slow thinking as usual baseline mental state.  No actual wish to die and no report of any hallucinations.  Tolerating medicine well.  Reviewed medicines and wrote out a list for the group home.  Prescriptions are refilled for 6 months we can follow-up in 6 months or as needed.  Psychoeducation and encouragement to the patient

## 2018-07-02 ENCOUNTER — Ambulatory Visit (INDEPENDENT_AMBULATORY_CARE_PROVIDER_SITE_OTHER): Payer: Medicare Other

## 2018-07-02 VITALS — BP 132/62 | HR 95 | Temp 97.5°F | Resp 16 | Ht <= 58 in | Wt 211.8 lb

## 2018-07-02 DIAGNOSIS — Z0000 Encounter for general adult medical examination without abnormal findings: Secondary | ICD-10-CM

## 2018-07-02 DIAGNOSIS — Z1231 Encounter for screening mammogram for malignant neoplasm of breast: Secondary | ICD-10-CM

## 2018-07-02 NOTE — Patient Instructions (Addendum)
Rita [name] , Thank you for taking time to come for your Medicare Wellness Visit. I appreciate your ongoing commitment to your health goals. Please review the following plan we discussed and let me know if I can assist you in the future.   Screening recommendations/referrals: Colonoscopy: completed 06/27/2017 Mammogram: due now, Please call [phone] to schedule your mammogram.  Bone Density: due at age 57 Recommended yearly ophthalmology/optometry visit for glaucoma screening and checkup Recommended yearly dental visit for hygiene and checkup  Vaccinations: Influenza vaccine: due 08/2018 Pneumococcal vaccine: completed series Tdap vaccine: up to date Shingles vaccine: shingrix eligible, check with your insurance company for coverage   Advanced directives: copy on file   Conditions/risks identified: Recommend continue to drink at least 6-8 glasses of water a day   Next appointment: Follow up on 07/14/2018 at 9:00am with [place]. Follow up in one year for your annual wellness exam.   Preventive Care 40-64 Years, Female Preventive care refers to lifestyle choices and visits with your health care provider that can promote health and wellness. What does preventive care include?  A yearly physical exam. This is also called an annual well check.  Dental exams once or twice a year.  Routine eye exams. Ask your health care provider how often you should have your eyes checked.  Personal lifestyle choices, including:  Daily care of your teeth and gums.  Regular physical activity.  Eating a healthy diet.  Avoiding tobacco and drug use.  Limiting alcohol use.  Practicing safe sex.  Taking low-dose aspirin daily starting at age 45.  Taking vitamin and mineral supplements as recommended by your health care provider. What happens during an annual well check? The services and screenings done by your health care provider during your annual well check will depend on your age,  overall health, lifestyle risk factors, and family history of disease. Counseling  Your health care provider may ask you questions about your:  Alcohol use.  Tobacco use.  Drug use.  Emotional well-being.  Home and relationship well-being.  Sexual activity.  Eating habits.  Work and work Statistician.  Method of birth control.  Menstrual cycle.  Pregnancy history. Screening  You may have the following tests or measurements:  Height, weight, and BMI.  Blood pressure.  Lipid and cholesterol levels. These may be checked every 5 years, or more frequently if you are over 64 years old.  Skin check.  Lung cancer screening. You may have this screening every year starting at age 16 if you have a 30-pack-year history of smoking and currently smoke or have quit within the past 15 years.  Fecal occult blood test (FOBT) of the stool. You may have this test every year starting at age 22.  Flexible sigmoidoscopy or colonoscopy. You may have a sigmoidoscopy every 5 years or a colonoscopy every 10 years starting at age 60.  Hepatitis C blood test.  Hepatitis B blood test.  Sexually transmitted disease (STD) testing.  Diabetes screening. This is done by checking your blood sugar (glucose) after you have not eaten for a while (fasting). You may have this done every 1-3 years.  Mammogram. This may be done every 1-2 years. Talk to your health care provider about when you should start having regular mammograms. This may depend on whether you have a family history of breast cancer.  BRCA-related cancer screening. This may be done if you have a family history of breast, ovarian, tubal, or peritoneal cancers.  Pelvic exam and Pap test.  This may be done every 3 years starting at age 44. Starting at age 70, this may be done every 5 years if you have a Pap test in combination with an HPV test.  Bone density scan. This is done to screen for osteoporosis. You may have this scan if you are at  high risk for osteoporosis. Discuss your test results, treatment options, and if necessary, the need for more tests with your health care provider. Vaccines  Your health care provider may recommend certain vaccines, such as:  Influenza vaccine. This is recommended every year.  Tetanus, diphtheria, and acellular pertussis (Tdap, Td) vaccine. You may need a Td booster every 10 years.  Zoster vaccine. You may need this after age 44.  Pneumococcal 13-valent conjugate (PCV13) vaccine. You may need this if you have certain conditions and were not previously vaccinated.  Pneumococcal polysaccharide (PPSV23) vaccine. You may need one or two doses if you smoke cigarettes or if you have certain conditions. Talk to your health care provider about which screenings and vaccines you need and how often you need them. This information is not intended to replace advice given to you by your health care provider. Make sure you discuss any questions you have with your health care provider. Document Released: 12/15/2015 Document Revised: 08/07/2016 Document Reviewed: 09/19/2015 Elsevier Interactive Patient Education  2017 [employer] Prevention in the Home Falls can cause injuries. They can happen to people of all ages. There are many things you can do to make your home safe and to help prevent falls. What can I do on the outside of my home?  Regularly fix the edges of walkways and driveways and fix any cracks.  Remove anything that might make you trip as you walk through a door, such as a raised step or threshold.  Trim any bushes or trees on the path to your home.  Use bright outdoor lighting.  Clear any walking paths of anything that might make someone trip, such as rocks or tools.  Regularly check to see if handrails are loose or broken. Make sure that both sides of any steps have handrails.  Any raised decks and porches should have guardrails on the edges.  Have any leaves, snow, or  ice cleared regularly.  Use sand or salt on walking paths during winter.  Clean up any spills in your garage right away. This includes oil or grease spills. What can I do in the bathroom?  Use night lights.  Install grab bars by the toilet and in the tub and shower. Do not use towel bars as grab bars.  Use non-skid mats or decals in the tub or shower.  If you need to sit down in the shower, use a plastic, non-slip stool.  Keep the floor dry. Clean up any water that spills on the floor as soon as it happens.  Remove soap buildup in the tub or shower regularly.  Attach bath mats securely with double-sided non-slip rug tape.  Do not have throw rugs and other things on the floor that can make you trip. What can I do in the bedroom?  Use night lights.  Make sure that you have a light by your bed that is easy to reach.  Do not use any sheets or blankets that are too big for your bed. They should not hang down onto the floor.  Have a firm chair that has side arms. You can use this for support while you get dressed.  Do not have throw rugs and other things on the floor that can make you trip. What can I do in the kitchen?  Clean up any spills right away.  Avoid walking on wet floors.  Keep items that you use a lot in easy-to-reach places.  If you need to reach something above you, use a strong step stool that has a grab bar.  Keep electrical cords out of the way.  Do not use floor polish or wax that makes floors slippery. If you must use wax, use non-skid floor wax.  Do not have throw rugs and other things on the floor that can make you trip. What can I do with my stairs?  Do not leave any items on the stairs.  Make sure that there are handrails on both sides of the stairs and use them. Fix handrails that are broken or loose. Make sure that handrails are as long as the stairways.  Check any carpeting to make sure that it is firmly attached to the stairs. Fix any carpet  that is loose or worn.  Avoid having throw rugs at the top or bottom of the stairs. If you do have throw rugs, attach them to the floor with carpet tape.  Make sure that you have a light switch at the top of the stairs and the bottom of the stairs. If you do not have them, ask someone to add them for you. What else can I do to help prevent falls?  Wear shoes that:  Do not have high heels.  Have rubber bottoms.  Are comfortable and fit you well.  Are closed at the toe. Do not wear sandals.  If you use a stepladder:  Make sure that it is fully opened. Do not climb a closed stepladder.  Make sure that both sides of the stepladder are locked into place.  Ask someone to hold it for you, if possible.  Clearly mark and make sure that you can see:  Any grab bars or handrails.  First and last steps.  Where the edge of each step is.  Use tools that help you move around (mobility aids) if they are needed. These include:  Canes.  Walkers.  Scooters.  Crutches.  Turn on the lights when you go into a dark area. Replace any light bulbs as soon as they burn out.  Set up your furniture so you have a clear path. Avoid moving your furniture around.  If any of your floors are uneven, fix them.  If there are any pets around you, be aware of where they are.  Review your medicines with your doctor. Some medicines can make you feel dizzy. This can increase your chance of falling. Ask your doctor what other things that you can do to help prevent falls. This information is not intended to replace advice given to you by your health care provider. Make sure you discuss any questions you have with your health care provider. Document Released: 09/14/2009 Document Revised: 04/25/2016 Document Reviewed: 12/23/2014 Elsevier Interactive Patient Education  2017 [employer].

## 2018-07-02 NOTE — Progress Notes (Signed)
Subjective:   Rita [name] is a 57 y.o. female who presents for Medicare Annual (Subsequent) preventive examination.  Accompanied by [name], caregiver.    Review of Systems:   Cardiac Risk Factors include: dyslipidemia;hypertension;obesity (BMI >30kg/m2)     Objective:     Vitals: BP 132/62 (BP Location: Left Arm, Patient Position: Sitting)   Pulse 95   Temp (!) 97.5 F (36.4 C) (Temporal)   Resp 16   Ht 4\' 9"  (1.448 m)   Wt 211 lb 12.8 oz (96.1 kg)   LMP  (LMP Unknown)   BMI 45.83 kg/m   Body mass index is 45.83 kg/m.  Advanced Directives 07/02/2018 07/02/2017 07/15/2016 11/15/2015 11/07/2015 07/20/2015 07/05/2015  Does Patient Have a Medical Advance Directive? Yes Yes Yes Yes Yes No Yes  Type of Paramedic of [place];Living will [place];Living will [place];Living will [place];Living will [place];Living will - [place]  Does patient want to make changes to medical advance directive? - - - No - Patient declined - - -  Copy of [place] in Chart? Yes Yes - No - copy requested - - -  Would patient like information on creating a medical advance directive? - - - - - No - patient declined information -  Some encounter information is confidential and restricted. Go to Review Flowsheets activity to see all data.    Tobacco Social History   Tobacco Use  Smoking Status Never Smoker  Smokeless Tobacco Never Used     Counseling given: Not Answered   Clinical Intake:  Pre-visit preparation completed: Yes  Pain : No/denies pain     Nutritional Status: BMI > 30  Obese Nutritional Risks: None Diabetes: No  How often do you need to have someone help you when you read instructions, pamphlets, or other written materials from your doctor or pharmacy?: 1 - Never What is the last grade level you completed in school?: 12th  grade  Interpreter Needed?: No  Information entered by :: Von [place]oe,LPN   Past Medical History:  Diagnosis Date  . Adenomatous polyps   . Anxiety   . Chronic constipation   . Chronic tension headaches   . CKD (chronic kidney disease) stage 3, GFR 30-59 ml/min (HCC)   . Depression   . Factitious disorder   . GERD (gastroesophageal reflux disease)   . Hyperlipidemia   . Hypertension   . Intellectual disability due to developmental disorder, unspecified   . Mental retardation   . Microscopic hematuria   . Migraine   . Obesity    Past Surgical History:  Procedure Laterality Date  . CARPAL TUNNEL RELEASE    . COLONOSCOPY WITH PROPOFOL N/A 06/27/2017   Procedure: COLONOSCOPY WITH PROPOFOL;  Surgeon: Manya Silvas, MD;  Location: [facility] ENDOSCOPY;  Service: Endoscopy;  Laterality: N/A;  . CYSTOSCOPY W/ RETROGRADES Bilateral 11/15/2015   Procedure: CYSTOSCOPY WITH RETROGRADE PYELOGRAM;  Surgeon: Hollice Espy, MD;  Location: [facility] ORS;  Service: Urology;  Laterality: Bilateral;  . US ECHOCARDIOGRAPHY  2012   showed mild heart failure (LVEF 52%); small pericardial effusion   Family History  Problem Relation Age of Onset  . Heart disease Father   . Hyperlipidemia Father   . Kidney disease Father   . Prostate cancer Neg Hx    Social History   Socioeconomic History  . Marital status: Single    Spouse name: Not on file  .  Number of children: Not on file  . Years of education: Not on file  . Highest education level: High school graduate  Occupational History  . Not on file  Social Needs  . Financial resource strain: Not hard at all  . Food insecurity:    Worry: Never true    Inability: Never true  . Transportation needs:    Medical: No    Non-medical: No  Tobacco Use  . Smoking status: Never Smoker  . Smokeless tobacco: Never Used  Substance and Sexual Activity  . Alcohol use: No  . Drug use: No  . Sexual activity: Never  Lifestyle  . Physical activity:     Days per week: 0 days    Minutes per session: 0 min  . Stress: Not at all  Relationships  . Social connections:    Talks on phone: More than three times a week    Gets together: More than three times a week    Attends religious service: More than 4 times per year    Active member of club or organization: Yes    Attends meetings of clubs or organizations: More than 4 times per year    Relationship status: Never married  Other Topics Concern  . Not on file  Social History Narrative   Goes to [employer]    Outpatient Encounter Medications as of 07/02/2018  Medication Sig  . acetaminophen (TYLENOL) 500 MG tablet Take 2 tablets (1,000 mg total) by mouth every 6 (six) hours as needed.  Marland Kitchen aspirin (ASPIRIN LOW DOSE) 81 MG EC tablet Take 1 tablet (81 mg total) by mouth daily. Swallow whole.  . cholecalciferol (VITAMIN D) 1000 units tablet TAKE 1 TABLET BY MOUTH ONCE DAILY.  . clonazePAM (KLONOPIN) 0.5 MG tablet Take 1 tablet (0.5 mg total) by mouth 2 (two) times daily.  Marland Kitchen dextromethorphan (DELSYM) 30 MG/5ML liquid Take 2.5 mLs (15 mg total) by mouth as needed for cough.  . docusate sodium (COLACE) 100 MG capsule Take 1 capsule (100 mg total) by mouth 2 (two) times daily as needed for mild constipation.  . DULoxetine (CYMBALTA) 30 MG capsule Take 1 capsule (30 mg total) by mouth at bedtime.  . DULoxetine (CYMBALTA) 60 MG capsule Daily  . hydrocortisone cream 1 % Apply 1 application topically 2 (two) times daily.  Marland Kitchen lisinopril (PRINIVIL,ZESTRIL) 5 MG tablet Take 1 tablet (5 mg total) by mouth daily.  . magnesium oxide (MAGNESIUM-OXIDE) 400 (241.3 Mg) MG tablet Take 1 tablet (400 mg total) by mouth daily.  . Multiple Vitamins-Minerals (MULTIVITAMIN) tablet Take 1 tablet by mouth daily.  Marland Kitchen neomycin-bacitracin-polymyxin (NEOSPORIN) [id number] ointment Apply 1 application topically daily as needed.  Marland Kitchen QUEtiapine (SEROQUEL) 100 MG tablet Take 1 tablet (100 mg total) by mouth at bedtime.  . ranitidine  (ZANTAC) 150 MG tablet Take 1 tablet (150 mg total) by mouth as needed.  . simvastatin (ZOCOR) 40 MG tablet TAKE ONE TABLET BY MOUTH AT BEDTIME.  . SUMAtriptan (IMITREX) 50 MG tablet Take 1 tablet (50 mg total) by mouth once a week.  . traZODone (DESYREL) 100 MG tablet Take 2 tablets (200 mg total) by mouth at bedtime.  Marland Kitchen aspirin 81 MG chewable tablet TAKE 1 TABLET BY MOUTH ONCE DAILY.  . DULoxetine (CYMBALTA) 60 MG capsule TAKE (1) CAPSULE BY MOUTH ONCE DAILY. (Patient not taking: Reported on 07/02/2018)  . Magnesium Oxide 400 (240 Mg) MG TABS TAKE 1 TABLET BY MOUTH ONCE DAILY. (Patient not taking: Reported on 07/02/2018)  .  Magnesium Oxide 400 (240 Mg) MG TABS TAKE 1 TABLET BY MOUTH ONCE DAILY. (Patient not taking: Reported on 07/02/2018)  . Multiple Vitamin (TAB-A-VITE) TABS TAKE 1 TABLET BY MOUTH ONCE DAILY.   No facility-administered encounter medications on file as of 07/02/2018.     Activities of Daily Living In your present state of health, do you have any difficulty performing the following activities: 07/02/2018 07/02/2017  Hearing? N N  Vision? N N  Difficulty concentrating or making decisions? N N  Walking or climbing stairs? N N  Dressing or bathing? N N  Doing errands, shopping? N N  Preparing Food and eating ? N N  Using the Toilet? N N  In the past six months, have you accidently leaked urine? Tempie Donning  Comment wears depends at night sometimes depends occasionally d/t kidney problems   Do you have problems with loss of bowel control? N N  Managing your Medications? N N  Managing your Finances? N Y  Comment - mother does Art gallery manager? N N  Some recent data might be hidden    Patient Care Team: Kathrine Haddock, NP as PCP - General (Nurse Practitioner) Kathrine Haddock, NP as PCP - Family Medicine (Nurse Practitioner) Anabel Bene, MD as Referring Physician (Neurology) Clapacs, Madie Reno, MD (Psychiatry) Anthonette Legato, MD (Internal  Medicine)    Assessment:   This is a routine wellness examination for [place].  Exercise Activities and Dietary recommendations Current Exercise Habits: Home exercise routine, Type of exercise: walking;treadmill, Time (Minutes): 30, Frequency (Times/Week): 5, Weekly Exercise (Minutes/Week): 150, Intensity: Mild, Exercise limited by: None identified  Goals    . DIET - INCREASE WATER INTAKE     Recommend continue to drink at least 6-8 glasses of water a day        Fall Risk Fall Risk  07/02/2018 01/09/2018 07/02/2017 07/15/2016 07/05/2015  Falls in the past year? No No No No No   Is the patient's home free of loose throw rugs in walkways, pet beds, electrical cords, etc?   yes      Grab bars in the bathroom? no      Handrails on the stairs?   yes      Adequate lighting?   yes  Timed Get Up and Go performed: Completed in 8 seconds with no use of assistive devices, steady gait. No intervention needed at this time.   Depression Screen PHQ 2/9 Scores 07/02/2018 01/09/2018 07/02/2017 07/15/2016  PHQ - 2 Score 2 1 0 0  PHQ- 9 Score 6 4 - -     Cognitive Function     6CIT Screen 07/02/2018 07/02/2017  What Year? 0 points 0 points  What month? 0 points 0 points  What time? 0 points 0 points  Count back from 20 0 points 2 points  Months in reverse 0 points 0 points  Repeat phrase 0 points 2 points  Total Score 0 4    Immunization History  Administered Date(s) Administered  . Influenza,inj,Quad PF,6+ Mos 09/20/2015, 10/14/2016, 09/26/2017  . Influenza-Unspecified 08/26/2014  . Td 10/20/2008    Qualifies for Shingles Vaccine? Yes, discussed shingrix vaccine   Screening Tests Health Maintenance  Topic Date Due  . INFLUENZA VACCINE  07/02/2018  . MAMMOGRAM  07/02/2018 (Originally 04/12/2016)  . PAP SMEAR  02/21/2020 (Originally 02/21/2017)  . TETANUS/TDAP  10/20/2018  . COLONOSCOPY  06/27/2022  . Hepatitis C Screening  Completed  . HIV Screening  Completed    Cancer  Screenings: Lung: Low  Dose CT Chest recommended if Age 69-80 years, 30 pack-year currently smoking OR have quit w/in 15years. Patient does not qualify. Breast:  Up to date on Mammogram? No   Up to date of Bone Density/Dexa? No, due at age 85  Colorectal: completed 06/27/2017  Additional Screenings:  Hepatitis C Screening: completed 04/08/2016     Plan:    I have personally reviewed and addressed the Medicare Annual Wellness questionnaire and have noted the following in the patient's chart:  A. Medical and social history B. Use of alcohol, tobacco or illicit drugs  C. Current medications and supplements D. Functional ability and status E.  Nutritional status F.  Physical activity G. Advance directives H. List of other physicians I.  Hospitalizations, surgeries, and ER visits in previous 12 months J.  [place] such as hearing and vision if needed, cognitive and depression L. Referrals and appointments   In addition, I have reviewed and discussed with patient certain preventive protocols, quality metrics, and best practice recommendations. A written personalized care plan for preventive services as well as general preventive health recommendations were provided to patient.   Signed,  Tyler Aas, LPN Nurse Health Advisor   Nurse Notes:none

## 2018-07-10 ENCOUNTER — Encounter: Payer: Self-pay | Admitting: Unknown Physician Specialty

## 2018-07-14 ENCOUNTER — Encounter: Payer: Self-pay | Admitting: Physician Assistant

## 2018-07-14 ENCOUNTER — Ambulatory Visit (INDEPENDENT_AMBULATORY_CARE_PROVIDER_SITE_OTHER): Payer: Medicare Other | Admitting: Physician Assistant

## 2018-07-14 ENCOUNTER — Other Ambulatory Visit: Payer: Self-pay

## 2018-07-14 VITALS — BP 113/76 | HR 75 | Temp 98.2°F | Ht 58.5 in | Wt 211.0 lb

## 2018-07-14 DIAGNOSIS — Z0000 Encounter for general adult medical examination without abnormal findings: Secondary | ICD-10-CM | POA: Diagnosis not present

## 2018-07-14 DIAGNOSIS — K2100 Gastro-esophageal reflux disease with esophagitis, without bleeding: Secondary | ICD-10-CM

## 2018-07-14 DIAGNOSIS — G43709 Chronic migraine without aura, not intractable, without status migrainosus: Secondary | ICD-10-CM

## 2018-07-14 DIAGNOSIS — Z131 Encounter for screening for diabetes mellitus: Secondary | ICD-10-CM

## 2018-07-14 DIAGNOSIS — K5909 Other constipation: Secondary | ICD-10-CM

## 2018-07-14 DIAGNOSIS — Z130 Encounter for screening for diseases of the blood and blood-forming organs and certain disorders involving the immune mechanism: Secondary | ICD-10-CM | POA: Diagnosis not present

## 2018-07-14 DIAGNOSIS — E782 Mixed hyperlipidemia: Secondary | ICD-10-CM

## 2018-07-14 DIAGNOSIS — R059 Cough, unspecified: Secondary | ICD-10-CM

## 2018-07-14 DIAGNOSIS — I10 Essential (primary) hypertension: Secondary | ICD-10-CM

## 2018-07-14 DIAGNOSIS — R05 Cough: Secondary | ICD-10-CM

## 2018-07-14 DIAGNOSIS — R748 Abnormal levels of other serum enzymes: Secondary | ICD-10-CM

## 2018-07-14 MED ORDER — SIMVASTATIN 40 MG PO TABS: 40.0000 mg | ORAL_TABLET | Freq: Every day | ORAL | 0 refills | Status: DC

## 2018-07-14 MED ORDER — MAGNESIUM OXIDE 400 (241.3 MG) MG PO TABS: 1.0000 | ORAL_TABLET | Freq: Every day | ORAL | 0 refills | Status: DC

## 2018-07-14 MED ORDER — ASPIRIN 81 MG PO TBEC: 81.0000 mg | DELAYED_RELEASE_TABLET | Freq: Every day | ORAL | 0 refills | Status: DC

## 2018-07-14 MED ORDER — SUMATRIPTAN SUCCINATE 50 MG PO TABS: 50.0000 mg | ORAL_TABLET | ORAL | 12 refills | Status: DC

## 2018-07-14 MED ORDER — DOCUSATE SODIUM 100 MG PO CAPS: 100.0000 mg | ORAL_CAPSULE | Freq: Two times a day (BID) | ORAL | 12 refills | Status: DC | PRN

## 2018-07-14 MED ORDER — RANITIDINE HCL 75 MG PO TABS: ORAL_TABLET | ORAL | 0 refills | Status: DC

## 2018-07-14 MED ORDER — DEXTROMETHORPHAN POLISTIREX ER 30 MG/5ML PO SUER: 15.0000 mg | ORAL | 12 refills | Status: DC | PRN

## 2018-07-14 MED ORDER — LISINOPRIL 5 MG PO TABS: 5.0000 mg | ORAL_TABLET | Freq: Every day | ORAL | 0 refills | Status: DC

## 2018-07-14 NOTE — Progress Notes (Signed)
Subjective:    Patient ID: Rita [name], female    DOB: 03-28-1961, 57 y.o.   MRN: [id number]  Rita [name] is a 57 y.o. female presenting on 07/14/2018 for Annual Exam and Medication Refill (aspirin, lisinopril, magnesium, mulivitamins, simvastatin, vitamin D 3,)   HPI   Colonoscopy: 2018 with tubular adenomas, repeat 5 years Mammogram: Scheduled 08/2018 PAP and Breast exam: declined today Psychiatry: Dr. Alethia Berthold, sees therapist Miguel Dibble  Adult family Living, lives with care giver and several other patients Stage III kidney disease dr. Anthonette Legato as nephrologist Migraines: imitrex Chronic constipation: colace GERD: taking zantac, will change orders to daily instead of PRN  Social History   Tobacco Use  . Smoking status: Never Smoker  . Smokeless tobacco: Never Used  Substance Use Topics  . Alcohol use: No  . Drug use: No    Review of Systems Per HPI unless specifically indicated above     Objective:    BP 113/76   Pulse 75   Temp 98.2 F (36.8 C) (Tympanic)   Ht 4' 10.5" (1.486 m)   Wt 211 lb (95.7 kg)   LMP  (LMP Unknown)   SpO2 98%   BMI 43.35 kg/m   Wt Readings from Last 3 Encounters:  07/14/18 211 lb (95.7 kg)  07/02/18 211 lb 12.8 oz (96.1 kg)  01/09/18 206 lb 3.2 oz (93.5 kg)    Physical Exam  Constitutional: She is oriented to person, place, and time. She appears well-developed and well-nourished.  HENT:  Right Ear: External ear normal.  Left Ear: External ear normal.  Mouth/Throat: Oropharynx is clear and moist. No oropharyngeal exudate.  Eyes: Conjunctivae are normal. Right eye exhibits no discharge. Left eye exhibits no discharge.  Neck: No thyromegaly present.  Cardiovascular: Normal rate and regular rhythm.  Pulmonary/Chest: Effort normal and breath sounds normal.  Musculoskeletal: Normal range of motion. She exhibits no edema.  Neurological: She is alert and oriented to person, place, and time.  Skin: Skin is warm and dry.  Capillary refill takes less than 2 seconds.  Psychiatric: She has a normal mood and affect. Her behavior is normal.   Results for orders placed or performed in visit on 07/14/18  Lipid Profile  Result Value Ref Range   Cholesterol, Total 226 (H) 100 - 199 mg/dL   Triglycerides 321 (H) 0 - 149 mg/dL   HDL 50 >39 mg/dL   VLDL Cholesterol Cal 64 (H) 5 - 40 mg/dL   LDL Calculated 112 (H) 0 - 99 mg/dL   Chol/HDL Ratio 4.5 (H) 0.0 - 4.4 ratio  Direct LDL  Result Value Ref Range   LDL Direct 129 (H) 0 - 99 mg/dL  HgB A1c  Result Value Ref Range   Hgb A1c MFr Bld 5.5 4.8 - 5.6 %   Est. average glucose Bld gHb Est-mCnc 111 mg/dL  Comp Met (CMET)  Result Value Ref Range   Glucose 90 65 - 99 mg/dL   BUN 14 6 - 24 mg/dL   Creatinine, Ser 1.45 (H) 0.57 - 1.00 mg/dL   GFR calc non Af Amer 40 (L) >59 mL/min/1.73   GFR calc Af Amer 46 (L) >59 mL/min/1.73   BUN/Creatinine Ratio 10 9 - 23   Sodium 143 134 - 144 mmol/L   Potassium 4.6 3.5 - 5.2 mmol/L   Chloride 105 96 - 106 mmol/L   CO2 23 20 - 29 mmol/L   Calcium 9.6 8.7 - 10.2 mg/dL   Total  Protein 6.6 6.0 - 8.5 g/dL   Albumin 4.0 3.5 - 5.5 g/dL   Globulin, Total 2.6 1.5 - 4.5 g/dL   Albumin/Globulin Ratio 1.5 1.2 - 2.2   Bilirubin Total 0.2 0.0 - 1.2 mg/dL   Alkaline Phosphatase 129 (H) 39 - 117 IU/L   AST 60 (H) 0 - 40 IU/L   ALT 61 (H) 0 - 32 IU/L  CBC with Differential  Result Value Ref Range   WBC 7.5 3.4 - 10.8 x10E3/uL   RBC 4.20 3.77 - 5.28 x10E6/uL   Hemoglobin 12.8 11.1 - 15.9 g/dL   Hematocrit 38.2 34.0 - 46.6 %   MCV 91 79 - 97 fL   MCH 30.5 26.6 - 33.0 pg   MCHC 33.5 31.5 - 35.7 g/dL   RDW 14.4 12.3 - 15.4 %   Platelets 281 150 - 450 x10E3/uL   Neutrophils 62 Not Estab. %   Lymphs 31 Not Estab. %   Monocytes 4 Not Estab. %   Eos 3 Not Estab. %   Basos 0 Not Estab. %   Neutrophils Absolute 4.6 1.4 - 7.0 x10E3/uL   Lymphocytes Absolute 2.4 0.7 - 3.1 x10E3/uL   Monocytes Absolute 0.3 0.1 - 0.9 x10E3/uL   EOS  (ABSOLUTE) 0.2 0.0 - 0.4 x10E3/uL   Basophils Absolute 0.0 0.0 - 0.2 x10E3/uL   Immature Granulocytes 0 Not Estab. %   Immature Grans (Abs) 0.0 0.0 - 0.1 x10E3/uL      Assessment & Plan:  1. Annual physical exam   2. Mixed hyperlipidemia  - Lipid Profile - Direct LDL - simvastatin (ZOCOR) 40 MG tablet; Take 1 tablet (40 mg total) by mouth at bedtime.  Dispense: 90 tablet; Refill: 0 - aspirin (ASPIRIN LOW DOSE) 81 MG EC tablet; Take 1 tablet (81 mg total) by mouth daily. Swallow whole.  Dispense: 90 tablet; Refill: 0  3. Diabetes mellitus screening  - HgB A1c  4. Essential hypertension  - Comp Met (CMET) - lisinopril (PRINIVIL,ZESTRIL) 5 MG tablet; Take 1 tablet (5 mg total) by mouth daily.  Dispense: 90 tablet; Refill: 0  5. Screening for deficiency anemia  - CBC with Differential  6. Chronic constipation  - magnesium oxide (MAGNESIUM-OXIDE) 400 (241.3 Mg) MG tablet; Take 1 tablet (400 mg total) by mouth daily.  Dispense: 90 tablet; Refill: 0 - docusate sodium (COLACE) 100 MG capsule; Take 1 capsule (100 mg total) by mouth 2 (two) times daily as needed for mild constipation.  Dispense: 10 capsule; Refill: 12  7. Chronic migraine without aura without status migrainosus, not intractable  - SUMAtriptan (IMITREX) 50 MG tablet; Take 1 tablet (50 mg total) by mouth once a week.  Dispense: 10 tablet; Refill: 12  8. Cough  - dextromethorphan (DELSYM) 30 MG/5ML liquid; Take 2.5 mLs (15 mg total) by mouth as needed for cough.  Dispense: 89 mL; Refill: 12  9. Gastroesophageal reflux disease with esophagitis  - ranitidine (ZANTAC 75) 75 MG tablet; Take once daily 30 minutes prior to breakfast or dinner.  Dispense: 90 tablet; Refill: 0    Follow up plan: Return in about 6 months (around 01/14/2019).  Carles Collet, PA-C [facility] 07/15/2018, 1:52 PM

## 2018-07-15 LAB — COMPREHENSIVE METABOLIC PANEL
ALT: 61 IU/L — ABNORMAL HIGH (ref 0–32)
AST: 60 IU/L — ABNORMAL HIGH (ref 0–40)
Albumin/Globulin Ratio: 1.5 (ref 1.2–2.2)
Albumin: 4 g/dL (ref 3.5–5.5)
Alkaline Phosphatase: 129 IU/L — ABNORMAL HIGH (ref 39–117)
BUN/Creatinine Ratio: 10 (ref 9–23)
BUN: 14 mg/dL (ref 6–24)
Bilirubin Total: 0.2 mg/dL (ref 0.0–1.2)
CO2: 23 mmol/L (ref 20–29)
Calcium: 9.6 mg/dL (ref 8.7–10.2)
Chloride: 105 mmol/L (ref 96–106)
Creatinine, Ser: 1.45 mg/dL — ABNORMAL HIGH (ref 0.57–1.00)
GFR calc Af Amer: 46 mL/min/{1.73_m2} — ABNORMAL LOW (ref >59)
GFR calc non Af Amer: 40 mL/min/{1.73_m2} — ABNORMAL LOW (ref >59)
Globulin, Total: 2.6 g/dL (ref 1.5–4.5)
Glucose: 90 mg/dL (ref 65–99)
Potassium: 4.6 mmol/L (ref 3.5–5.2)
Sodium: 143 mmol/L (ref 134–144)
Total Protein: 6.6 g/dL (ref 6.0–8.5)

## 2018-07-15 LAB — LDL CHOLESTEROL, DIRECT: LDL Direct: 129 mg/dL — ABNORMAL HIGH (ref 0–99)

## 2018-07-15 LAB — CBC WITH DIFFERENTIAL/PLATELET
Basophils Absolute: 0 10*3/uL (ref 0.0–0.2)
Basos: 0 %
EOS (ABSOLUTE): 0.2 10*3/uL (ref 0.0–0.4)
Eos: 3 %
Hematocrit: 38.2 % (ref 34.0–46.6)
Hemoglobin: 12.8 g/dL (ref 11.1–15.9)
Immature Grans (Abs): 0 10*3/uL (ref 0.0–0.1)
Immature Granulocytes: 0 %
Lymphocytes Absolute: 2.4 10*3/uL (ref 0.7–3.1)
Lymphs: 31 %
MCH: 30.5 pg (ref 26.6–33.0)
MCHC: 33.5 g/dL (ref 31.5–35.7)
MCV: 91 fL (ref 79–97)
Monocytes Absolute: 0.3 10*3/uL (ref 0.1–0.9)
Monocytes: 4 %
Neutrophils Absolute: 4.6 10*3/uL (ref 1.4–7.0)
Neutrophils: 62 %
Platelets: 281 10*3/uL (ref 150–450)
RBC: 4.2 x10E6/uL (ref 3.77–5.28)
RDW: 14.4 % (ref 12.3–15.4)
WBC: 7.5 10*3/uL (ref 3.4–10.8)

## 2018-07-15 LAB — LIPID PANEL
Chol/HDL Ratio: 4.5 ratio — ABNORMAL HIGH (ref 0.0–4.4)
Cholesterol, Total: 226 mg/dL — ABNORMAL HIGH (ref 100–199)
HDL: 50 mg/dL (ref >39)
LDL Calculated: 112 mg/dL — ABNORMAL HIGH (ref 0–99)
Triglycerides: 321 mg/dL — ABNORMAL HIGH (ref 0–149)
VLDL Cholesterol Cal: 64 mg/dL — ABNORMAL HIGH (ref 5–40)

## 2018-07-15 LAB — HEMOGLOBIN A1C
Est. average glucose Bld gHb Est-mCnc: 111 mg/dL
Hgb A1c MFr Bld: 5.5 % (ref 4.8–5.6)

## 2018-07-15 NOTE — Addendum Note (Signed)
Addended by: Trinna Post on: 07/15/2018 01:57 PM   Modules accepted: Orders

## 2018-07-16 MED ORDER — ATORVASTATIN CALCIUM 40 MG PO TABS: ORAL_TABLET | ORAL | 0 refills | Status: DC

## 2018-07-16 NOTE — Addendum Note (Signed)
Addended by: Trinna Post on: 07/16/2018 03:18 PM   Modules accepted: Orders

## 2018-07-17 ENCOUNTER — Other Ambulatory Visit: Payer: Self-pay | Admitting: Unknown Physician Specialty

## 2018-08-06 ENCOUNTER — Ambulatory Visit
Admission: RE | Admit: 2018-08-06 | Discharge: 2018-08-06 | Disposition: A | Discharge status: Home / Self Care | Payer: Medicare Other | Source: Ambulatory Visit | Attending: Physician Assistant | Admitting: Physician Assistant

## 2018-08-06 DIAGNOSIS — Z1231 Encounter for screening mammogram for malignant neoplasm of breast: Secondary | ICD-10-CM | POA: Insufficient documentation

## 2018-09-09 ENCOUNTER — Telehealth: Payer: Self-pay | Admitting: Unknown Physician Specialty

## 2018-09-09 NOTE — Telephone Encounter (Addendum)
Copied from [place] [phone]. Topic: Quick Communication - See Telephone Encounter >> Sep 09, 2018  2:15 PM Ivar Drape wrote: CRM for notification. See Telephone encounter for: 09/09/18. Patient would like to know if the provider would write her a prescription for Topamax for weight loss .

## 2018-09-09 NOTE — Telephone Encounter (Signed)
Would have to come into office to be seen and assessed.

## 2018-09-09 NOTE — Telephone Encounter (Signed)
Called pt's care giver pam foggleman states that her other doctor said that she wouldn't be able to take medicine she is wanting

## 2018-09-10 ENCOUNTER — Other Ambulatory Visit: Payer: Self-pay | Admitting: Family Medicine

## 2018-09-10 ENCOUNTER — Telehealth: Payer: Self-pay | Admitting: Family Medicine

## 2018-09-10 ENCOUNTER — Other Ambulatory Visit: Payer: Self-pay | Admitting: Physician Assistant

## 2018-09-10 DIAGNOSIS — E782 Mixed hyperlipidemia: Secondary | ICD-10-CM

## 2018-09-10 NOTE — Telephone Encounter (Signed)
Requested medication (s) are due for refill today: yes  Requested medication (s) are on the active medication list: yes  Last refill:  08/13/18  Future visit scheduled: yes  Notes to clinic:  Unable to fill per nurse triage protocol    Requested Prescriptions  Pending Prescriptions Disp Refills   magnesium oxide (MAG-OX) 400 (241.3 Mg) MG tablet [Pharmacy Med Name: MAGNESIUM OXIDE 400 MG TABLET] 30 tablet 0    Sig: TAKE 1 TABLET BY MOUTH ONCE DAILY.     Endocrinology:  Minerals - Magnesium Supplementation Failed - 09/10/2018 11:24 AM      Failed - Mg Level in normal range and within 360 days    Magnesium  Date Value Ref Range Status  12/28/2013 1.7 (L) mg/dL Final    Comment:    1.8-2.4 THERAPEUTIC RANGE: 4-7 mg/dL TOXIC: > 10 mg/dL  -----------------------          Passed - Valid encounter within last 12 months    Recent Outpatient Visits          1 month ago Annual physical exam   [facility] Carles Collet M, PA-C   8 months ago Toenail deformity   [facility] Kathrine Haddock, NP   1 year ago Fatigue, unspecified type   [facility] Kathrine Haddock, NP   1 year ago Annual physical exam   [facility] Kathrine Haddock, NP   2 years ago Annual physical exam   [facility] Kathrine Haddock, NP      Future Appointments            In 4 months Cannady, Barbaraann Faster, NP [employer]         Signed Prescriptions Disp Refills   aspirin 81 MG chewable tablet 90 tablet 0    Sig: TAKE 1 TABLET BY MOUTH ONCE DAILY.     Analgesics:  NSAIDS - aspirin Passed - 09/10/2018 11:24 AM      Passed - Patient is not pregnant      Passed - Valid encounter within last 12 months    Recent Outpatient Visits          1 month ago Annual physical exam   [facility] Carles Collet M, PA-C   8 months ago Toenail deformity   [facility] Kathrine Haddock, NP   1 year ago Fatigue,  unspecified type   [facility] Kathrine Haddock, NP   1 year ago Annual physical exam   [facility] Kathrine Haddock, NP   2 years ago Annual physical exam   [facility] Kathrine Haddock, NP      Future Appointments            In 4 months Cannady, Barbaraann Faster, NP [employer], [place]

## 2018-09-10 NOTE — Telephone Encounter (Signed)
Refill request for Zantac 75MG  QDAY and Atorvastatin 40MG  QHS approved and sent to pharmacy.

## 2018-09-10 NOTE — Telephone Encounter (Signed)
Called and spoke with Vivien Rota. This is not the patient she was calling on. Nor are we the correct provider, it was a Dr. Nena Polio pt. Vivien Rota will call and speak with Dr. Merwyn Katos office about CT results. Will disregard this message. Will forward to Freight forwarder as Juluis Rainier.

## 2018-09-10 NOTE — Telephone Encounter (Signed)
I don't see an order for a CT or any results. We have not seen her since August-- did they call on the right patient?

## 2018-09-10 NOTE — Telephone Encounter (Signed)
Copied from [place] [id number]. Topic: General - Other >> Sep 09, 2018  2:25 PM Ivar Drape wrote: Reason for CRM:   Vivien Rota with the CT Dept at [facility] [phone] stated the patient was added on their schedule today but the patient would not wait for the results.  She wanted the provider to know the results are in [place].

## 2018-09-10 NOTE — Telephone Encounter (Signed)
Request for Mag Oxide refill submitted.  Will need Mag level next visit.

## 2018-10-02 ENCOUNTER — Ambulatory Visit (INDEPENDENT_AMBULATORY_CARE_PROVIDER_SITE_OTHER): Payer: Medicare Other

## 2018-10-02 DIAGNOSIS — Z23 Encounter for immunization: Secondary | ICD-10-CM | POA: Diagnosis not present

## 2018-10-12 ENCOUNTER — Other Ambulatory Visit: Payer: Self-pay | Admitting: Nurse Practitioner

## 2018-10-12 ENCOUNTER — Other Ambulatory Visit: Payer: Self-pay

## 2018-10-12 DIAGNOSIS — I10 Essential (primary) hypertension: Secondary | ICD-10-CM

## 2018-10-12 MED ORDER — LISINOPRIL 5 MG PO TABS: 5.0000 mg | ORAL_TABLET | Freq: Every day | ORAL | 3 refills | Status: DC

## 2018-10-12 NOTE — Progress Notes (Signed)
Refill request for Lisinopril sent from pharmacy.  Medication refill sent.

## 2018-10-12 NOTE — Telephone Encounter (Signed)
Fax from pharmacy for refill on Lisinopril 5mg  tablet. Appointment 11/14/2019 with Marnee Guarneri.

## 2018-11-05 ENCOUNTER — Other Ambulatory Visit: Payer: Self-pay | Admitting: Psychiatry

## 2018-11-06 ENCOUNTER — Other Ambulatory Visit: Payer: Self-pay

## 2018-11-06 DIAGNOSIS — K2100 Gastro-esophageal reflux disease with esophagitis, without bleeding: Secondary | ICD-10-CM

## 2018-11-06 DIAGNOSIS — E782 Mixed hyperlipidemia: Secondary | ICD-10-CM

## 2018-11-06 MED ORDER — MAGNESIUM OXIDE 400 (241.3 MG) MG PO TABS: 1.0000 | ORAL_TABLET | Freq: Every day | ORAL | 0 refills | Status: DC

## 2018-11-06 MED ORDER — RANITIDINE HCL 150 MG PO TABS: ORAL_TABLET | ORAL | 0 refills | Status: DC

## 2018-11-06 MED ORDER — ATORVASTATIN CALCIUM 40 MG PO TABS: ORAL_TABLET | ORAL | 0 refills | Status: DC

## 2018-11-06 NOTE — Telephone Encounter (Signed)
Refill request approved

## 2018-12-07 ENCOUNTER — Other Ambulatory Visit: Payer: Self-pay | Admitting: Family Medicine

## 2018-12-07 DIAGNOSIS — E782 Mixed hyperlipidemia: Secondary | ICD-10-CM

## 2018-12-07 MED ORDER — ATORVASTATIN CALCIUM 40 MG PO TABS: ORAL_TABLET | ORAL | 0 refills | Status: DC

## 2018-12-07 MED ORDER — MAGNESIUM OXIDE 400 (241.3 MG) MG PO TABS: 1.0000 | ORAL_TABLET | Freq: Every day | ORAL | 0 refills | Status: DC

## 2018-12-07 MED ORDER — RANITIDINE HCL 150 MG PO TABS: ORAL_TABLET | ORAL | 0 refills | Status: DC

## 2018-12-07 NOTE — Telephone Encounter (Signed)
Fax from pharmacy for Atorvastatin, Magnesium Oxide, and Zantac. [employer] requested Aspirin 81.  Upcoming Appointment 01/14/2019

## 2018-12-17 ENCOUNTER — Encounter: Payer: Self-pay | Admitting: Psychiatry

## 2018-12-17 ENCOUNTER — Other Ambulatory Visit: Payer: Self-pay

## 2018-12-17 ENCOUNTER — Ambulatory Visit (INDEPENDENT_AMBULATORY_CARE_PROVIDER_SITE_OTHER): Payer: Medicare Other | Admitting: Psychiatry

## 2018-12-17 VITALS — BP 103/70 | HR 116 | Temp 99.6°F | Wt 215.8 lb

## 2018-12-17 DIAGNOSIS — F819 Developmental disorder of scholastic skills, unspecified: Secondary | ICD-10-CM | POA: Diagnosis not present

## 2018-12-17 DIAGNOSIS — F259 Schizoaffective disorder, unspecified: Secondary | ICD-10-CM

## 2018-12-17 DIAGNOSIS — F411 Generalized anxiety disorder: Secondary | ICD-10-CM

## 2018-12-17 MED ORDER — QUETIAPINE FUMARATE 100 MG PO TABS: 100.0000 mg | ORAL_TABLET | Freq: Every day | ORAL | 5 refills | Status: DC

## 2018-12-17 MED ORDER — DULOXETINE HCL 60 MG PO CPEP: ORAL_CAPSULE | ORAL | 5 refills | Status: DC

## 2018-12-17 MED ORDER — TRAZODONE HCL 100 MG PO TABS: 200.0000 mg | ORAL_TABLET | Freq: Every day | ORAL | 5 refills | Status: DC

## 2018-12-17 MED ORDER — DULOXETINE HCL 30 MG PO CPEP: 30.0000 mg | ORAL_CAPSULE | Freq: Every day | ORAL | 5 refills | Status: DC

## 2018-12-17 MED ORDER — CLONAZEPAM 0.5 MG PO TABS: 0.5000 mg | ORAL_TABLET | Freq: Two times a day (BID) | ORAL | 5 refills | Status: DC

## 2018-12-17 NOTE — Progress Notes (Signed)
Follow-up for this patient with schizoaffective disorder and intellectual development problems.  Met with the group home manager and with [name].  Group home manager reports that the patient continues to be a bit of a handful particularly because of her obsessive focus on her medical problems but they are managing fine.  The patient herself has no specific new complaints other than chronic complaints about her headaches which she is working on with another doctor.  She denies suicidal thoughts.  Does not appear to be agitated or aggressive.  Tolerating medicine well.  Plan to renew and continue all medicines supportive counseling completed follow-up in 6 months

## 2018-12-31 ENCOUNTER — Other Ambulatory Visit: Payer: Self-pay

## 2018-12-31 NOTE — Telephone Encounter (Signed)
Refill request for Magnesium Oxide. Upcoming appointment 01/14/2019.

## 2019-01-01 MED ORDER — MAGNESIUM OXIDE 400 (241.3 MG) MG PO TABS: 1.0000 | ORAL_TABLET | Freq: Every day | ORAL | 0 refills | Status: DC

## 2019-01-04 ENCOUNTER — Other Ambulatory Visit: Payer: Self-pay | Admitting: Family Medicine

## 2019-01-04 NOTE — Telephone Encounter (Signed)
No protocol for this medication, Multiple Vitamin (TAB-A-VITE) tabs. Provider J. Ned Card, NP

## 2019-01-04 NOTE — Telephone Encounter (Signed)
Courtesy refill until appointment 01/14/19.

## 2019-01-04 NOTE — Telephone Encounter (Signed)
Attempted to call patient for clarification on her refills and if she has enough to last until her appointment coming up next week.  Left message to call back regarding this.

## 2019-01-04 NOTE — Telephone Encounter (Signed)
Caregiver called to advise pt DOES NOT have enough med to get to appt.  Would like the refill now please.

## 2019-01-14 ENCOUNTER — Ambulatory Visit: Payer: Medicare Other | Admitting: Nurse Practitioner

## 2019-01-15 ENCOUNTER — Encounter: Payer: Self-pay | Admitting: Nurse Practitioner

## 2019-01-18 ENCOUNTER — Other Ambulatory Visit: Payer: Self-pay | Admitting: Nurse Practitioner

## 2019-01-18 DIAGNOSIS — E782 Mixed hyperlipidemia: Secondary | ICD-10-CM

## 2019-01-19 ENCOUNTER — Encounter: Payer: Self-pay | Admitting: Nurse Practitioner

## 2019-01-19 ENCOUNTER — Other Ambulatory Visit: Payer: Self-pay

## 2019-01-19 ENCOUNTER — Ambulatory Visit (INDEPENDENT_AMBULATORY_CARE_PROVIDER_SITE_OTHER): Payer: Medicare Other | Admitting: Nurse Practitioner

## 2019-01-19 VITALS — BP 127/88 | HR 74 | Temp 97.9°F | Ht 58.5 in | Wt 213.2 lb

## 2019-01-19 DIAGNOSIS — I10 Essential (primary) hypertension: Secondary | ICD-10-CM | POA: Diagnosis not present

## 2019-01-19 DIAGNOSIS — E782 Mixed hyperlipidemia: Secondary | ICD-10-CM

## 2019-01-19 DIAGNOSIS — K5909 Other constipation: Secondary | ICD-10-CM

## 2019-01-19 DIAGNOSIS — R7301 Impaired fasting glucose: Secondary | ICD-10-CM | POA: Diagnosis not present

## 2019-01-19 DIAGNOSIS — R059 Cough, unspecified: Secondary | ICD-10-CM

## 2019-01-19 DIAGNOSIS — R05 Cough: Secondary | ICD-10-CM

## 2019-01-19 MED ORDER — ATORVASTATIN CALCIUM 40 MG PO TABS: 40.0000 mg | ORAL_TABLET | Freq: Every day | ORAL | 0 refills | Status: DC

## 2019-01-19 MED ORDER — ASPIRIN 81 MG PO TBEC: 81.0000 mg | DELAYED_RELEASE_TABLET | Freq: Every day | ORAL | 0 refills | Status: DC

## 2019-01-19 MED ORDER — LISINOPRIL 5 MG PO TABS: 5.0000 mg | ORAL_TABLET | Freq: Every day | ORAL | 3 refills | Status: DC

## 2019-01-19 MED ORDER — RANITIDINE HCL 150 MG PO TABS: ORAL_TABLET | ORAL | 5 refills | Status: DC

## 2019-01-19 MED ORDER — MAGNESIUM OXIDE 400 (241.3 MG) MG PO TABS: 1.0000 | ORAL_TABLET | Freq: Every day | ORAL | 3 refills | Status: DC

## 2019-01-19 MED ORDER — DOCUSATE SODIUM 100 MG PO CAPS: 100.0000 mg | ORAL_CAPSULE | Freq: Two times a day (BID) | ORAL | 12 refills | Status: DC | PRN

## 2019-01-19 MED ORDER — VITAMIN D3 25 MCG (1000 UNIT) PO TABS: 1000.0000 [IU] | ORAL_TABLET | Freq: Every day | ORAL | 2 refills | Status: DC

## 2019-01-19 MED ORDER — DEXTROMETHORPHAN POLISTIREX ER 30 MG/5ML PO SUER: 15.0000 mg | ORAL | 12 refills | Status: DC | PRN

## 2019-01-19 MED ORDER — ACETAMINOPHEN 500 MG PO TABS: 1000.0000 mg | ORAL_TABLET | Freq: Four times a day (QID) | ORAL | 12 refills | Status: DC | PRN

## 2019-01-19 NOTE — Progress Notes (Signed)
BP 127/88 (BP Location: Left Arm, Patient Position: Sitting, Cuff Size: Large)   Pulse 74   Temp 97.9 F (36.6 C) (Oral)   Ht 4' 10.5" (1.486 m)   Wt 213 lb 4 oz (96.7 kg)   LMP  (LMP Unknown)   SpO2 97%   BMI 43.80 kg/m    Subjective:    Patient ID: Rita [name], female    DOB: 10-03-1961, 58 y.o.   MRN: [id number]  HPI: Rita [name] is a 58 y.o. female  Chief Complaint  Patient presents with  . Hypertension  . Hyperlipidemia  . Prescriptions   Ms. Cabacungan presents today with her group home caregiver.  They voice no concerns or questions today.  Yearly paperwork brought for provider to sign.  IFG: No current diabetes diagnosis on chart, over past year her A1C have ranged from 5.5 to 5.8 with most recent on 07/14/18 of 5.5 (below prediabetic range).  Continues to be maintained on diet at home.    HYPERTENSION / HYPERLIPIDEMIA Taking Atorvastatin for cholesterol.  Lisinopril for blood pressure.  She visits with nephrology, Dr. Holley Raring, and last saw two weeks ago with labs obtained.  Has been staying at baseline kidney function. Satisfied with current treatment? yes Duration of hypertension: chronic BP monitoring frequency: not checking BP range:  BP medication side effects: no Duration of hyperlipidemia: chronic Cholesterol medication side effects: no Cholesterol supplements: none Medication compliance: good compliance Aspirin: yes Recent stressors: no Recurrent headaches: no Visual changes: no Palpitations: no Dyspnea: no Chest pain: no Lower extremity edema: no Dizzy/lightheaded: no  Relevant past medical, surgical, family and social history reviewed and updated as indicated. Interim medical history since our last visit reviewed. Allergies and medications reviewed and updated.  Review of Systems  Constitutional: Negative for activity change, appetite change, diaphoresis, fatigue and fever.  Respiratory: Negative for cough, chest tightness and shortness of  breath.   Cardiovascular: Negative for chest pain, palpitations and leg swelling.  Gastrointestinal: Negative for abdominal distention, abdominal pain, constipation, diarrhea, nausea and vomiting.  Endocrine: Negative for cold intolerance, heat intolerance, polydipsia, polyphagia and polyuria.  Neurological: Negative for dizziness, syncope, weakness, light-headedness, numbness and headaches.  Psychiatric/Behavioral: Negative.     Per HPI unless specifically indicated above     Objective:    BP 127/88 (BP Location: Left Arm, Patient Position: Sitting, Cuff Size: Large)   Pulse 74   Temp 97.9 F (36.6 C) (Oral)   Ht 4' 10.5" (1.486 m)   Wt 213 lb 4 oz (96.7 kg)   LMP  (LMP Unknown)   SpO2 97%   BMI 43.80 kg/m   Wt Readings from Last 3 Encounters:  01/19/19 213 lb 4 oz (96.7 kg)  07/14/18 211 lb (95.7 kg)  07/02/18 211 lb 12.8 oz (96.1 kg)    Physical Exam Vitals signs and nursing note reviewed.  Constitutional:      General: She is awake.     Appearance: She is well-developed.  HENT:     Head: Normocephalic.     Right Ear: Hearing normal.     Left Ear: Hearing normal.     Nose: Nose normal.     Mouth/Throat:     Mouth: Mucous membranes are moist.  Eyes:     General: Lids are normal.        Right eye: No discharge.        Left eye: No discharge.     Conjunctiva/sclera: Conjunctivae normal.     Pupils:  Pupils are equal, round, and reactive to light.  Neck:     Musculoskeletal: Normal range of motion and neck supple.     Thyroid: No thyromegaly.     Vascular: No carotid bruit or JVD.  Cardiovascular:     Rate and Rhythm: Normal rate and regular rhythm.     Heart sounds: Normal heart sounds. No murmur. No gallop.   Pulmonary:     Effort: Pulmonary effort is normal.     Breath sounds: Normal breath sounds.  Abdominal:     General: Bowel sounds are normal.     Palpations: Abdomen is soft. There is no hepatomegaly or splenomegaly.  Musculoskeletal:     Right lower  leg: No edema.     Left lower leg: No edema.  Lymphadenopathy:     Cervical: No cervical adenopathy.  Skin:    General: Skin is warm and dry.  Neurological:     Mental Status: She is alert and oriented to person, place, and time.  Psychiatric:        Attention and Perception: Attention normal.        Mood and Affect: Mood normal.        Behavior: Behavior normal. Behavior is cooperative.        Thought Content: Thought content normal.        Judgment: Judgment normal.     Results for orders placed or performed in visit on 07/14/18  Lipid Profile  Result Value Ref Range   Cholesterol, Total 226 (H) 100 - 199 mg/dL   Triglycerides 321 (H) 0 - 149 mg/dL   HDL 50 >39 mg/dL   VLDL Cholesterol Cal 64 (H) 5 - 40 mg/dL   LDL Calculated 112 (H) 0 - 99 mg/dL   Chol/HDL Ratio 4.5 (H) 0.0 - 4.4 ratio  Direct LDL  Result Value Ref Range   LDL Direct 129 (H) 0 - 99 mg/dL  HgB A1c  Result Value Ref Range   Hgb A1c MFr Bld 5.5 4.8 - 5.6 %   Est. average glucose Bld gHb Est-mCnc 111 mg/dL  Comp Met (CMET)  Result Value Ref Range   Glucose 90 65 - 99 mg/dL   BUN 14 6 - 24 mg/dL   Creatinine, Ser 1.45 (H) 0.57 - 1.00 mg/dL   GFR calc non Af Amer 40 (L) >59 mL/min/1.73   GFR calc Af Amer 46 (L) >59 mL/min/1.73   BUN/Creatinine Ratio 10 9 - 23   Sodium 143 134 - 144 mmol/L   Potassium 4.6 3.5 - 5.2 mmol/L   Chloride 105 96 - 106 mmol/L   CO2 23 20 - 29 mmol/L   Calcium 9.6 8.7 - 10.2 mg/dL   Total Protein 6.6 6.0 - 8.5 g/dL   Albumin 4.0 3.5 - 5.5 g/dL   Globulin, Total 2.6 1.5 - 4.5 g/dL   Albumin/Globulin Ratio 1.5 1.2 - 2.2   Bilirubin Total 0.2 0.0 - 1.2 mg/dL   Alkaline Phosphatase 129 (H) 39 - 117 IU/L   AST 60 (H) 0 - 40 IU/L   ALT 61 (H) 0 - 32 IU/L  CBC with Differential  Result Value Ref Range   WBC 7.5 3.4 - 10.8 x10E3/uL   RBC 4.20 3.77 - 5.28 x10E6/uL   Hemoglobin 12.8 11.1 - 15.9 g/dL   Hematocrit 38.2 34.0 - 46.6 %   MCV 91 79 - 97 fL   MCH 30.5 26.6 - 33.0 pg     MCHC 33.5 31.5 - 35.7 g/dL  RDW 14.4 12.3 - 15.4 %   Platelets 281 150 - 450 x10E3/uL   Neutrophils 62 Not Estab. %   Lymphs 31 Not Estab. %   Monocytes 4 Not Estab. %   Eos 3 Not Estab. %   Basos 0 Not Estab. %   Neutrophils Absolute 4.6 1.4 - 7.0 x10E3/uL   Lymphocytes Absolute 2.4 0.7 - 3.1 x10E3/uL   Monocytes Absolute 0.3 0.1 - 0.9 x10E3/uL   EOS (ABSOLUTE) 0.2 0.0 - 0.4 x10E3/uL   Basophils Absolute 0.0 0.0 - 0.2 x10E3/uL   Immature Granulocytes 0 Not Estab. %   Immature Grans (Abs) 0.0 0.0 - 0.1 x10E3/uL      Assessment & Plan:   Problem List Items Addressed This Visit      Cardiovascular and Mediastinum   Essential hypertension - Primary    Chronic, ongoing.  Continue current medication regimen.  Followed by nephrology with recent labs at visit.      Relevant Medications   atorvastatin (LIPITOR) 40 MG tablet   lisinopril (PRINIVIL,ZESTRIL) 5 MG tablet   aspirin (ASPIRIN LOW DOSE) 81 MG EC tablet     Digestive   Chronic constipation   Relevant Medications   docusate sodium (COLACE) 100 MG capsule     Endocrine   IFG (impaired fasting glucose)    Recommend continued focus on healthy diet and weight loss.  August 2019 A1C 5.5, will recheck in August 2020.        Other   Hyperlipidemia    Chronic, ongoing.  Continue current medication regimen.   Lipid panel today and adjust dose as needed.      Relevant Medications   atorvastatin (LIPITOR) 40 MG tablet   lisinopril (PRINIVIL,ZESTRIL) 5 MG tablet   aspirin (ASPIRIN LOW DOSE) 81 MG EC tablet   Other Relevant Orders   Lipid Panel w/o Chol/HDL Ratio    Other Visit Diagnoses    Cough       Relevant Medications   dextromethorphan (DELSYM) 30 MG/5ML liquid       Follow up plan: Return in about 6 months (around 07/20/2019) for HTN/HLD.

## 2019-01-19 NOTE — Patient Instructions (Signed)
Healthy Eating Following a healthy eating pattern may help you to achieve and maintain a healthy body weight, reduce the risk of chronic disease, and live a long and productive life. It is important to follow a healthy eating pattern at an appropriate calorie level for your body. Your nutritional needs should be met primarily through food by choosing a variety of nutrient-rich foods. What are tips for following this plan? Reading food labels  Read labels and choose the following: ? Reduced or low sodium. ? Juices with 100% fruit juice. ? Foods with low saturated fats and high polyunsaturated and monounsaturated fats. ? Foods with whole grains, such as whole wheat, cracked wheat, brown rice, and wild rice. ? Whole grains that are fortified with folic acid. This is recommended for women who are pregnant or who want to become pregnant.  Read labels and avoid the following: ? Foods with a lot of added sugars. These include foods that contain brown sugar, corn sweetener, corn syrup, dextrose, fructose, glucose, high-fructose corn syrup, honey, invert sugar, lactose, malt syrup, maltose, molasses, raw sugar, sucrose, trehalose, or turbinado sugar.  Do not eat more than the following amounts of added sugar per day:  6 teaspoons (25 g) for women.  9 teaspoons (38 g) for men. ? Foods that contain processed or refined starches and grains. ? Refined grain products, such as white flour, degermed cornmeal, white bread, and white rice. Shopping  Choose nutrient-rich snacks, such as vegetables, whole fruits, and nuts. Avoid high-calorie and high-sugar snacks, such as potato chips, fruit snacks, and candy.  Use oil-based dressings and spreads on foods instead of solid fats such as butter, stick margarine, or cream cheese.  Limit pre-made sauces, mixes, and "instant" products such as flavored rice, instant noodles, and ready-made pasta.  Try more plant-protein sources, such as tofu, tempeh, black beans,  edamame, lentils, nuts, and seeds.  Explore eating plans such as the Mediterranean diet or vegetarian diet. Cooking  Use oil to saut or stir-fry foods instead of solid fats such as butter, stick margarine, or lard.  Try baking, boiling, grilling, or broiling instead of frying.  Remove the fatty part of meats before cooking.  Steam vegetables in water or broth. Meal planning   At meals, imagine dividing your plate into fourths: ? One-half of your plate is fruits and vegetables. ? One-fourth of your plate is whole grains. ? One-fourth of your plate is protein, especially lean meats, poultry, eggs, tofu, beans, or nuts.  Include low-fat dairy as part of your daily diet. Lifestyle  Choose healthy options in all settings, including home, work, school, restaurants, or stores.  Prepare your food safely: ? Wash your hands after handling raw meats. ? Keep food preparation surfaces clean by regularly washing with hot, soapy water. ? Keep raw meats separate from ready-to-eat foods, such as fruits and vegetables. ? Cook seafood, meat, poultry, and eggs to the recommended internal temperature. ? Store foods at safe temperatures. In general:  Keep cold foods at 59F (4.4C) or below.  Keep hot foods at 159F (60C) or above.  Keep your freezer at [facility] (-17.8C) or below.  Foods are no longer safe to eat when they have been between the temperatures of 40-159F (4.4-60C) for more than 2 hours. What foods should I eat? Fruits Aim to eat 2 cup-equivalents of fresh, canned (in natural juice), or frozen fruits each day. Examples of 1 cup-equivalent of fruit include 1 small apple, 8 large strawberries, 1 cup canned fruit,  cup  dried fruit, or 1 cup 100% juice. Vegetables Aim to eat 2-3 cup-equivalents of fresh and frozen vegetables each day, including different varieties and colors. Examples of 1 cup-equivalent of vegetables include 2 medium carrots, 2 cups raw, leafy greens, 1 cup chopped  vegetable (raw or cooked), or 1 medium baked potato. Grains Aim to eat 6 ounce-equivalents of whole grains each day. Examples of 1 ounce-equivalent of grains include 1 slice of bread, 1 cup ready-to-eat cereal, 3 cups popcorn, or  cup cooked rice, pasta, or cereal. Meats and other proteins Aim to eat 5-6 ounce-equivalents of protein each day. Examples of 1 ounce-equivalent of protein include 1 egg, 1/2 cup nuts or seeds, or 1 tablespoon (16 g) peanut butter. A cut of meat or fish that is the size of a deck of cards is about 3-4 ounce-equivalents.  Of the protein you eat each week, try to have at least 8 ounces come from seafood. This includes salmon, trout, herring, and anchovies. Dairy Aim to eat 3 cup-equivalents of fat-free or low-fat dairy each day. Examples of 1 cup-equivalent of dairy include 1 cup (240 mL) milk, 8 ounces (250 g) yogurt, 1 ounces (44 g) natural cheese, or 1 cup (240 mL) fortified soy milk. Fats and oils  Aim for about 5 teaspoons (21 g) per day. Choose monounsaturated fats, such as canola and olive oils, avocados, peanut butter, and most nuts, or polyunsaturated fats, such as sunflower, corn, and soybean oils, walnuts, pine nuts, sesame seeds, sunflower seeds, and flaxseed. Beverages  Aim for six 8-oz glasses of water per day. Limit coffee to three to five 8-oz cups per day.  Limit caffeinated beverages that have added calories, such as soda and energy drinks.  Limit alcohol intake to no more than 1 drink a day for nonpregnant women and 2 drinks a day for men. One drink equals 12 oz of beer (355 mL), 5 oz of wine (148 mL), or 1 oz of hard liquor (44 mL). Seasoning and other foods  Avoid adding excess amounts of salt to your foods. Try flavoring foods with herbs and spices instead of salt.  Avoid adding sugar to foods.  Try using oil-based dressings, sauces, and spreads instead of solid fats. This information is based on general [place] nutrition guidelines. For more  information, visit [employer]. Exact amounts may vary based on your nutrition needs. Summary  A healthy eating plan may help you to maintain a healthy weight, reduce the risk of chronic diseases, and stay active throughout your life.  Plan your meals. Make sure you eat the right portions of a variety of nutrient-rich foods.  Try baking, boiling, grilling, or broiling instead of frying.  Choose healthy options in all settings, including home, work, school, restaurants, or stores. This information is not intended to replace advice given to you by your health care provider. Make sure you discuss any questions you have with your health care provider. Document Released: 03/02/2018 Document Revised: 03/02/2018 Document Reviewed: 03/02/2018 Elsevier Interactive Patient Education  2019 [employer].

## 2019-01-19 NOTE — Assessment & Plan Note (Signed)
Chronic, ongoing.  Continue current medication regimen.   Lipid panel today and adjust dose as needed.

## 2019-01-19 NOTE — Assessment & Plan Note (Signed)
Recommend continued focus on healthy diet and weight loss.  August 2019 A1C 5.5, will recheck in August 2020.

## 2019-01-19 NOTE — Assessment & Plan Note (Signed)
Chronic, ongoing.  Continue current medication regimen.  Followed by nephrology with recent labs at visit.

## 2019-01-20 ENCOUNTER — Other Ambulatory Visit: Payer: Self-pay | Admitting: Nurse Practitioner

## 2019-01-20 DIAGNOSIS — E782 Mixed hyperlipidemia: Secondary | ICD-10-CM

## 2019-01-20 LAB — LIPID PANEL W/O CHOL/HDL RATIO
Cholesterol, Total: 222 mg/dL — ABNORMAL HIGH (ref 100–199)
HDL: 48 mg/dL (ref >39)
LDL Calculated: 106 mg/dL — ABNORMAL HIGH (ref 0–99)
Triglycerides: 339 mg/dL — ABNORMAL HIGH (ref 0–149)
VLDL Cholesterol Cal: 68 mg/dL — ABNORMAL HIGH (ref 5–40)

## 2019-01-20 MED ORDER — ATORVASTATIN CALCIUM 40 MG PO TABS: 40.0000 mg | ORAL_TABLET | Freq: Every day | ORAL | 3 refills | Status: DC

## 2019-01-27 ENCOUNTER — Telehealth: Payer: Self-pay | Admitting: Nurse Practitioner

## 2019-01-27 NOTE — Telephone Encounter (Signed)
Called and spoke with patient's caregiver. Let her know what Jolene said, labs mailed to patient as well.

## 2019-01-27 NOTE — Telephone Encounter (Signed)
Copied from [place] [id number]. Topic: General - Other >> Jan 27, 2019 11:58 AM Carolyn Stare wrote:  Pt cal lto ask for a RX for Topamax to help her lose weight , pt also req a copy of herl bas be mailed to her home address >> Jan 27, 2019  1:26 PM Stark Klein wrote: Pt called to ask for a RX for Topamax to help her lose weight , pt also req a copy of her  Lab results be mailed to her home address

## 2019-01-28 ENCOUNTER — Other Ambulatory Visit: Payer: Self-pay | Admitting: Family Medicine

## 2019-02-24 ENCOUNTER — Other Ambulatory Visit: Payer: Self-pay | Admitting: Nurse Practitioner

## 2019-02-24 ENCOUNTER — Other Ambulatory Visit: Payer: Self-pay | Admitting: Family Medicine

## 2019-02-24 NOTE — Telephone Encounter (Signed)
Your patient 

## 2019-03-23 ENCOUNTER — Other Ambulatory Visit: Payer: Self-pay | Admitting: Nurse Practitioner

## 2019-03-23 ENCOUNTER — Telehealth: Payer: Self-pay | Admitting: Nurse Practitioner

## 2019-03-23 MED ORDER — OMEPRAZOLE 20 MG PO CPDR: 20.0000 mg | DELAYED_RELEASE_CAPSULE | Freq: Every day | ORAL | 3 refills | Status: DC

## 2019-03-23 MED ORDER — FAMOTIDINE 20 MG PO TABS: 20.0000 mg | ORAL_TABLET | Freq: Two times a day (BID) | ORAL | 5 refills | Status: DC

## 2019-03-23 NOTE — Telephone Encounter (Signed)
I will send in PPI then.  Cimetidine has too many interactions.  Will send now.

## 2019-03-23 NOTE — Telephone Encounter (Signed)
Rita [name], patients cargiver, called and stated that the pharmacy has been trying to reach the office in regards to Zantac. She needs a new prescription sent in to replace this since it has been recalled.

## 2019-03-23 NOTE — Progress Notes (Signed)
Pepcid not available, will switch to PPI.

## 2019-03-23 NOTE — Telephone Encounter (Signed)
Pepcid is also not available they stated only thing available is a PPI or cimetidine 800mg . Please advise

## 2019-03-23 NOTE — Progress Notes (Signed)
Zantac recalled.  Famotidine sent in.

## 2019-03-23 NOTE — Telephone Encounter (Signed)
Will do right now.

## 2019-03-24 ENCOUNTER — Telehealth: Payer: Self-pay | Admitting: Nurse Practitioner

## 2019-03-24 NOTE — Telephone Encounter (Signed)
Requested medication (s) are due for refill today:  Yes   Requested medication (s) are on the active medication list:  yes  Future visit scheduled:  yes  Last Refill:  Vita D3 1000u. 02/24/19; #30; no refills         MVI with minerals 01/09/18; #90 ; no refills  (expired)  Note to clinic:  Plain MVI was ordered 01/04/19 with #30; no refills                         No noted Vita D level check in lab work; unsure why last refill was only for #30 with no refills; please                                 advise on continued need.    Requested Prescriptions  Pending Prescriptions Disp Refills   cholecalciferol (VITAMIN D) 25 MCG (1000 UT) tablet [Pharmacy Med Name: VITAMIN D3 1,000 UNITS TAB] 30 tablet 0    Sig: TAKE 1 TABLET BY MOUTH ONCE DAILY.     Endocrinology:  Vitamins - Vitamin D Supplementation Failed - 03/24/2019 10:46 AM      Failed - 50,000 IU strengths are not delegated      Failed - Phosphate in normal range and within 360 days    No results found for: PHOS       Failed - Vitamin D in normal range and within 360 days    No results found for: [id number], [id number], [id number], 25OHVITD3, 25OHVITD2, 25OHVITD3, 25OHVITD2, 25OHVITD1, 25OHVITD2, 25OHVITD3, VD25OH       Passed - Ca in normal range and within 360 days    Calcium  Date Value Ref Range Status  07/14/2018 9.6 8.7 - 10.2 mg/dL Final   Calcium, Total  Date Value Ref Range Status  12/28/2013 8.6 8.5 - 10.1 mg/dL Final         Passed - Valid encounter within last 12 months    Recent Outpatient Visits          2 months ago Essential hypertension   [facility], Henrine Screws T, NP   8 months ago Annual physical exam   [facility] Trinna Post, PA-C   1 year ago Toenail deformity   [facility] Kathrine Haddock, NP   1 year ago Fatigue, unspecified type   [facility] Kathrine Haddock, NP   2 years ago Annual physical exam   [facility] Kathrine Haddock,  NP      Future Appointments            In 3 months Cannady, Barbaraann Faster, NP [employer]          Multiple Vitamins-Minerals (TAB-A-VITE) TABS [Pharmacy Med Name: MULTIVITAMIN TABLET] 30 tablet 0    Sig: TAKE 1 TABLET BY MOUTH ONCE DAILY.     There is no refill protocol information for this order

## 2019-03-24 NOTE — Telephone Encounter (Signed)
Routed approved request in error.

## 2019-04-21 ENCOUNTER — Other Ambulatory Visit: Payer: Self-pay | Admitting: Nurse Practitioner

## 2019-04-21 DIAGNOSIS — I10 Essential (primary) hypertension: Secondary | ICD-10-CM

## 2019-05-18 ENCOUNTER — Other Ambulatory Visit: Payer: Self-pay | Admitting: Nurse Practitioner

## 2019-05-18 ENCOUNTER — Other Ambulatory Visit: Payer: Self-pay | Admitting: Psychiatry

## 2019-06-10 ENCOUNTER — Other Ambulatory Visit: Payer: Self-pay | Admitting: Nurse Practitioner

## 2019-06-10 DIAGNOSIS — I10 Essential (primary) hypertension: Secondary | ICD-10-CM

## 2019-06-17 ENCOUNTER — Ambulatory Visit: Payer: Medicare Other | Admitting: Psychiatry

## 2019-06-29 ENCOUNTER — Telehealth: Payer: Self-pay | Admitting: Nurse Practitioner

## 2019-06-29 NOTE — Telephone Encounter (Signed)
Copied from [place] [id number]. Topic: General - Other >> Jun 29, 2019  3:20 PM Rainey Pines A wrote: Patient would like a callback from nurse in regards to medication for weight loss before her upcoming appointment

## 2019-06-29 NOTE — Telephone Encounter (Signed)
Patient's caregiver notified. She states that the patient's kidney doctor told her she cannot take any weight loss medications due to her kidney function.

## 2019-07-01 ENCOUNTER — Other Ambulatory Visit: Payer: Self-pay

## 2019-07-01 ENCOUNTER — Ambulatory Visit (INDEPENDENT_AMBULATORY_CARE_PROVIDER_SITE_OTHER): Payer: Medicare Other | Admitting: Psychiatry

## 2019-07-01 ENCOUNTER — Ambulatory Visit: Payer: Medicare Other | Admitting: Psychiatry

## 2019-07-01 DIAGNOSIS — F819 Developmental disorder of scholastic skills, unspecified: Secondary | ICD-10-CM

## 2019-07-01 DIAGNOSIS — F259 Schizoaffective disorder, unspecified: Secondary | ICD-10-CM

## 2019-07-01 MED ORDER — QUETIAPINE FUMARATE 100 MG PO TABS: 100.0000 mg | ORAL_TABLET | Freq: Every day | ORAL | 5 refills | Status: DC

## 2019-07-01 MED ORDER — DULOXETINE HCL 30 MG PO CPEP: 30.0000 mg | ORAL_CAPSULE | Freq: Every day | ORAL | 5 refills | Status: DC

## 2019-07-01 MED ORDER — CLONAZEPAM 0.5 MG PO TABS: 0.5000 mg | ORAL_TABLET | Freq: Two times a day (BID) | ORAL | 5 refills | Status: DC

## 2019-07-01 MED ORDER — DULOXETINE HCL 60 MG PO CPEP: ORAL_CAPSULE | ORAL | 5 refills | Status: DC

## 2019-07-01 MED ORDER — TRAZODONE HCL 100 MG PO TABS: 200.0000 mg | ORAL_TABLET | Freq: Every day | ORAL | 5 refills | Status: DC

## 2019-07-01 NOTE — Progress Notes (Signed)
Follow-up patient with chronic depression and anxiety.  Spoke to the patient and to the staff at the group home.  Patient has no complaints.  Denies depression denies suicidal thoughts.  Denies hallucinations.  Group home staff say that she has been behaving fine.  No new health problems no problems with behavior or mood.  Patient is very slow as usual in her speech.  Somewhat garbled.  Lucid however and not bizarre.  No evidence of acute dangerousness.  Review medication.  Renew everything follow-up 6 months

## 2019-07-15 ENCOUNTER — Other Ambulatory Visit: Payer: Self-pay | Admitting: Nurse Practitioner

## 2019-07-15 NOTE — Telephone Encounter (Signed)
Please advise 

## 2019-07-20 ENCOUNTER — Encounter: Payer: Self-pay | Admitting: Nurse Practitioner

## 2019-07-20 ENCOUNTER — Other Ambulatory Visit: Payer: Self-pay

## 2019-07-20 ENCOUNTER — Ambulatory Visit (INDEPENDENT_AMBULATORY_CARE_PROVIDER_SITE_OTHER): Payer: Medicare Other | Admitting: Nurse Practitioner

## 2019-07-20 VITALS — BP 117/65 | HR 87 | Temp 98.0°F | Wt 220.2 lb

## 2019-07-20 DIAGNOSIS — E782 Mixed hyperlipidemia: Secondary | ICD-10-CM

## 2019-07-20 DIAGNOSIS — N183 Chronic kidney disease, stage 3 unspecified: Secondary | ICD-10-CM

## 2019-07-20 DIAGNOSIS — E559 Vitamin D deficiency, unspecified: Secondary | ICD-10-CM

## 2019-07-20 DIAGNOSIS — I10 Essential (primary) hypertension: Secondary | ICD-10-CM | POA: Diagnosis not present

## 2019-07-20 DIAGNOSIS — R7301 Impaired fasting glucose: Secondary | ICD-10-CM

## 2019-07-20 MED ORDER — ATORVASTATIN CALCIUM 40 MG PO TABS: 40.0000 mg | ORAL_TABLET | Freq: Every day | ORAL | 3 refills | Status: DC

## 2019-07-20 MED ORDER — ASPIRIN 81 MG PO TBEC: 81.0000 mg | DELAYED_RELEASE_TABLET | Freq: Every day | ORAL | 3 refills | Status: DC

## 2019-07-20 MED ORDER — MAGNESIUM OXIDE 400 (241.3 MG) MG PO TABS: 1.0000 | ORAL_TABLET | Freq: Every day | ORAL | 3 refills | Status: DC

## 2019-07-20 MED ORDER — LISINOPRIL 5 MG PO TABS: 5.0000 mg | ORAL_TABLET | Freq: Every day | ORAL | 3 refills | Status: DC

## 2019-07-20 MED ORDER — OMEPRAZOLE 20 MG PO CPDR: DELAYED_RELEASE_CAPSULE | ORAL | 3 refills | Status: DC

## 2019-07-20 MED ORDER — VITAMIN D3 25 MCG (1000 UNIT) PO TABS: 1000.0000 [IU] | ORAL_TABLET | Freq: Every day | ORAL | 3 refills | Status: DC

## 2019-07-20 NOTE — Assessment & Plan Note (Addendum)
Chronic, ongoing.  Continue current medication regimen and adjust as needed.  Lipid panel and LFT's today.   °

## 2019-07-20 NOTE — Assessment & Plan Note (Signed)
Chronic, stable.  Continue current medication regimen and collaboration with nephrology.  Lisinopril for kidney protection.

## 2019-07-20 NOTE — Patient Instructions (Signed)
Fat and Cholesterol Restricted Eating Plan Getting too much fat and cholesterol in your diet may cause health problems. Choosing the right foods helps keep your fat and cholesterol at normal levels. This can keep you from getting certain diseases. Your doctor may recommend an eating plan that includes:  Total fat: ______% or less of total calories a day.  Saturated fat: ______% or less of total calories a day.  Cholesterol: less than _________mg a day.  Fiber: ______g a day. What are tips for following this plan? Meal planning  At meals, divide your plate into four equal parts: ? Fill one-half of your plate with vegetables and green salads. ? Fill one-fourth of your plate with whole grains. ? Fill one-fourth of your plate with low-fat (lean) protein foods.  Eat fish that is high in omega-3 fats at least two times a week. This includes mackerel, tuna, sardines, and salmon.  Eat foods that are high in fiber, such as whole grains, beans, apples, broccoli, carrots, peas, and barley. General tips   Work with your doctor to lose weight if you need to.  Avoid: ? Foods with added sugar. ? Fried foods. ? Foods with partially hydrogenated oils.  Limit alcohol intake to no more than 1 drink a day for nonpregnant women and 2 drinks a day for men. One drink equals 12 oz of beer, 5 oz of wine, or 1 oz of hard liquor. Reading food labels  Check food labels for: ? Trans fats. ? Partially hydrogenated oils. ? Saturated fat (g) in each serving. ? Cholesterol (mg) in each serving. ? Fiber (g) in each serving.  Choose foods with healthy fats, such as: ? Monounsaturated fats. ? Polyunsaturated fats. ? Omega-3 fats.  Choose grain products that have whole grains. Look for the word "whole" as the first word in the ingredient list. Cooking  Cook foods using low-fat methods. These include baking, boiling, grilling, and broiling.  Eat more home-cooked foods. Eat at restaurants and buffets  less often.  Avoid cooking using saturated fats, such as butter, cream, palm oil, palm kernel oil, and coconut oil. Recommended foods  Fruits  All fresh, canned (in natural juice), or frozen fruits. Vegetables  Fresh or frozen vegetables (raw, steamed, roasted, or grilled). Green salads. Grains  Whole grains, such as whole wheat or whole grain breads, crackers, cereals, and pasta. Unsweetened oatmeal, bulgur, barley, quinoa, or brown rice. Corn or whole wheat flour tortillas. Meats and other protein foods  Ground beef (85% or leaner), grass-fed beef, or beef trimmed of fat. Skinless chicken or turkey. Ground chicken or turkey. Pork trimmed of fat. All fish and seafood. Egg whites. Dried beans, peas, or lentils. Unsalted nuts or seeds. Unsalted canned beans. Nut butters without added sugar or oil. Dairy  Low-fat or nonfat dairy products, such as skim or 1% milk, 2% or reduced-fat cheeses, low-fat and fat-free ricotta or cottage cheese, or plain low-fat and nonfat yogurt. Fats and oils  Tub margarine without trans fats. Light or reduced-fat mayonnaise and salad dressings. Avocado. Olive, canola, sesame, or safflower oils. The items listed above may not be a complete list of foods and beverages you can eat. Contact a dietitian for more information. Foods to avoid Fruits  Canned fruit in heavy syrup. Fruit in cream or butter sauce. Fried fruit. Vegetables  Vegetables cooked in cheese, cream, or butter sauce. Fried vegetables. Grains  White bread. White pasta. White rice. Cornbread. Bagels, pastries, and croissants. Crackers and snack foods that contain trans fat   and hydrogenated oils. Meats and other protein foods  Fatty cuts of meat. Ribs, chicken wings, bacon, sausage, bologna, salami, chitterlings, fatback, hot dogs, bratwurst, and packaged lunch meats. Liver and organ meats. Whole eggs and egg yolks. Chicken and turkey with skin. Fried meat. Dairy  Whole or 2% milk, cream,  half-and-half, and cream cheese. Whole milk cheeses. Whole-fat or sweetened yogurt. Full-fat cheeses. Nondairy creamers and whipped toppings. Processed cheese, cheese spreads, and cheese curds. Beverages  Alcohol. Sugar-sweetened drinks such as sodas, lemonade, and fruit drinks. Fats and oils  Butter, stick margarine, lard, shortening, ghee, or bacon fat. Coconut, palm kernel, and palm oils. Sweets and desserts  Corn syrup, sugars, honey, and molasses. Candy. Jam and jelly. Syrup. Sweetened cereals. Cookies, pies, cakes, donuts, muffins, and ice cream. The items listed above may not be a complete list of foods and beverages you should avoid. Contact a dietitian for more information. Summary  Choosing the right foods helps keep your fat and cholesterol at normal levels. This can keep you from getting certain diseases.  At meals, fill one-half of your plate with vegetables and green salads.  Eat high-fiber foods, like whole grains, beans, apples, carrots, peas, and barley.  Limit added sugar, saturated fats, alcohol, and fried foods. This information is not intended to replace advice given to you by your health care provider. Make sure you discuss any questions you have with your health care provider. Document Released: 05/19/2012 Document Revised: 07/22/2018 Document Reviewed: 08/05/2017 Elsevier Patient Education  2020 [employer]  

## 2019-07-20 NOTE — Assessment & Plan Note (Signed)
Recommend continued diet focus, cutting back on soda and drinking more water + weight loss.  A1C at end of June 5.7%.  No current medications.

## 2019-07-20 NOTE — Assessment & Plan Note (Signed)
Continue collaboration with nephrology and review recent labs once available.

## 2019-07-20 NOTE — Progress Notes (Signed)
BP 117/65   Pulse 87   Temp 98 F (36.7 C) (Oral)   Wt 220 lb 3.2 oz (99.9 kg)   LMP  (LMP Unknown)   SpO2 98%   BMI 45.23 kg/m    Subjective:    Patient ID: Rita [name], female    DOB: 09-Dec-1960, 58 y.o.   MRN: [id number]  HPI: Rita [name] is a 58 y.o. female  Chief Complaint  Patient presents with  . Hyperlipidemia  . Hypertension   HYPERTENSION / HYPERLIPIDEMIA WITH CKD Last saw nephrology 05/20/2019.  Continues on Atorvastatin and Lisinopril. Satisfied with current treatment? yes Duration of hypertension: chronic BP monitoring frequency: not checking BP range:  BP medication side effects: no Duration of hyperlipidemia: chronic Cholesterol medication side effects: no Cholesterol supplements: none Medication compliance: good compliance Aspirin: yes Recent stressors: no Recurrent headaches: no Visual changes: no Palpitations: no Dyspnea: no Chest pain: no Lower extremity edema: no Dizzy/lightheaded: no   IFG: No current medications, diet focused.  Recent A1C 5.7% 05/05/2019. Hypoglycemic episodes:no Polydipsia/polyuria: no Visual disturbance: no Chest pain: no Paresthesias: no   Relevant past medical, surgical, family and social history reviewed and updated as indicated. Interim medical history since our last visit reviewed. Allergies and medications reviewed and updated.  Review of Systems  Constitutional: Negative for activity change, appetite change, diaphoresis, fatigue and fever.  Respiratory: Negative for cough, chest tightness and shortness of breath.   Cardiovascular: Negative for chest pain, palpitations and leg swelling.  Gastrointestinal: Negative for abdominal distention, abdominal pain, constipation, diarrhea, nausea and vomiting.  Endocrine: Negative for cold intolerance, heat intolerance, polydipsia, polyphagia and polyuria.  Neurological: Negative for dizziness, syncope, weakness, light-headedness, numbness and headaches.   Psychiatric/Behavioral: Negative.     Per HPI unless specifically indicated above     Objective:    BP 117/65   Pulse 87   Temp 98 F (36.7 C) (Oral)   Wt 220 lb 3.2 oz (99.9 kg)   LMP  (LMP Unknown)   SpO2 98%   BMI 45.23 kg/m   Wt Readings from Last 3 Encounters:  07/20/19 220 lb 3.2 oz (99.9 kg)  01/19/19 213 lb 4 oz (96.7 kg)  07/14/18 211 lb (95.7 kg)    Physical Exam Vitals signs and nursing note reviewed.  Constitutional:      General: She is awake. She is not in acute distress.    Appearance: She is well-developed. She is obese. She is not ill-appearing.  HENT:     Head: Normocephalic.     Right Ear: Hearing normal.     Left Ear: Hearing normal.  Eyes:     General: Lids are normal.        Right eye: No discharge.        Left eye: No discharge.     Conjunctiva/sclera: Conjunctivae normal.     Pupils: Pupils are equal, round, and reactive to light.  Neck:     Musculoskeletal: Normal range of motion and neck supple.     Thyroid: No thyromegaly.     Vascular: No carotid bruit.  Cardiovascular:     Rate and Rhythm: Normal rate and regular rhythm.     Heart sounds: Normal heart sounds. No murmur. No gallop.   Pulmonary:     Effort: Pulmonary effort is normal. No accessory muscle usage or respiratory distress.     Breath sounds: Normal breath sounds.  Abdominal:     General: Bowel sounds are normal.  Palpations: Abdomen is soft.  Musculoskeletal:     Right lower leg: No edema.     Left lower leg: No edema.  Lymphadenopathy:     Cervical: No cervical adenopathy.  Skin:    General: Skin is warm and dry.  Neurological:     Mental Status: She is alert and oriented to person, place, and time.  Psychiatric:        Attention and Perception: Attention normal.        Mood and Affect: Mood normal.        Behavior: Behavior normal. Behavior is cooperative.        Thought Content: Thought content normal.        Judgment: Judgment normal.     Results for  orders placed or performed in visit on 01/19/19  Lipid Panel w/o Chol/HDL Ratio  Result Value Ref Range   Cholesterol, Total 222 (H) 100 - 199 mg/dL   Triglycerides 339 (H) 0 - 149 mg/dL   HDL 48 >39 mg/dL   VLDL Cholesterol Cal 68 (H) 5 - 40 mg/dL   LDL Calculated 106 (H) 0 - 99 mg/dL      Assessment & Plan:   Problem List Items Addressed This Visit      Cardiovascular and Mediastinum   Essential hypertension    Chronic, stable.  Continue current medication regimen and collaboration with nephrology.  Lisinopril for kidney protection.      Relevant Medications   lisinopril (ZESTRIL) 5 MG tablet   atorvastatin (LIPITOR) 40 MG tablet   aspirin (ASPIRIN LOW DOSE) 81 MG EC tablet     Endocrine   IFG (impaired fasting glucose)    Recommend continued diet focus, cutting back on soda and drinking more water + weight loss.  A1C at end of June 5.7%.  No current medications.        Genitourinary   Chronic kidney disease, stage 3 ([address]) - Primary    Continue collaboration with nephrology and review recent labs once available.        Other   Hyperlipidemia    Chronic, ongoing.  Continue current medication regimen and adjust as needed.  Lipid panel and LFT's today.        Relevant Medications   lisinopril (ZESTRIL) 5 MG tablet   atorvastatin (LIPITOR) 40 MG tablet   aspirin (ASPIRIN LOW DOSE) 81 MG EC tablet   Other Relevant Orders   Lipid Panel w/o Chol/HDL Ratio   Hepatic function panel    Other Visit Diagnoses    Vitamin D deficiency       Continue supplement and check Vitamin D level today.   Relevant Orders   VITAMIN D 25 Hydroxy (Vit-D Deficiency, Fractures)       Follow up plan: Return in about 6 months (around 01/20/2020) for HTN/HLD, Mood, and Prediabetes.

## 2019-07-21 LAB — LIPID PANEL W/O CHOL/HDL RATIO
Cholesterol, Total: 225 mg/dL — ABNORMAL HIGH (ref 100–199)
HDL: 50 mg/dL (ref >39)
LDL Calculated: 98 mg/dL (ref 0–99)
Triglycerides: 384 mg/dL — ABNORMAL HIGH (ref 0–149)
VLDL Cholesterol Cal: 77 mg/dL — ABNORMAL HIGH (ref 5–40)

## 2019-07-21 LAB — HEPATIC FUNCTION PANEL
ALT: 39 IU/L — ABNORMAL HIGH (ref 0–32)
AST: 25 IU/L (ref 0–40)
Albumin: 4.1 g/dL (ref 3.8–4.9)
Alkaline Phosphatase: 124 IU/L — ABNORMAL HIGH (ref 39–117)
Bilirubin Total: 0.2 mg/dL (ref 0.0–1.2)
Bilirubin, Direct: 0.05 mg/dL (ref 0.00–0.40)
Total Protein: 6.6 g/dL (ref 6.0–8.5)

## 2019-07-21 LAB — VITAMIN D 25 HYDROXY (VIT D DEFICIENCY, FRACTURES): Vit D, 25-Hydroxy: 30.5 ng/mL (ref 30.0–100.0)

## 2019-08-09 ENCOUNTER — Ambulatory Visit: Payer: Medicare Other

## 2019-08-10 ENCOUNTER — Ambulatory Visit (INDEPENDENT_AMBULATORY_CARE_PROVIDER_SITE_OTHER): Payer: Medicare Other

## 2019-08-10 ENCOUNTER — Other Ambulatory Visit: Payer: Self-pay

## 2019-08-10 DIAGNOSIS — Z23 Encounter for immunization: Secondary | ICD-10-CM

## 2019-08-17 ENCOUNTER — Other Ambulatory Visit: Payer: Self-pay | Admitting: Nurse Practitioner

## 2019-08-17 NOTE — Telephone Encounter (Signed)
Requested medication (s) are due for refill today: yes  Requested medication (s) are on the active medication list:yes  Last refill:  07/15/2019  Future visit scheduled: yes  Notes to clinic:  Medication was discontinue    Requested Prescriptions  Pending Prescriptions Disp Refills   Multiple Vitamins-Minerals (TAB-A-VITE) TABS [Pharmacy Med Name: MULTIVITAMIN TABLET] 30 tablet 0    Sig: TAKE 1 TABLET BY MOUTH ONCE DAILY.     There is no refill protocol information for this order     aspirin 81 MG chewable tablet [Pharmacy Med Name: ASPIRIN 81 MG CHEWABLE TABLET] 30 tablet 0    Sig: TAKE 1 TABLET BY MOUTH ONCE DAILY.     Analgesics:  NSAIDS - aspirin Passed - 08/17/2019 10:57 AM      Passed - Patient is not pregnant      Passed - Valid encounter within last 12 months    Recent Outpatient Visits          4 weeks ago Chronic kidney disease, stage 3 ([place])   [place], Barbaraann Faster, NP   7 months ago Essential hypertension   [place], Hutchins T, NP   1 year ago Annual physical exam   [place] Trinna Post, PA-C   1 year ago Toenail deformity   [facility] Kathrine Haddock, NP   2 years ago Fatigue, unspecified type   [facility] Kathrine Haddock, NP      Future Appointments            In 5 months Cannady, Barbaraann Faster, NP [facility]

## 2019-08-17 NOTE — Telephone Encounter (Signed)
Routing to provider  

## 2019-09-28 ENCOUNTER — Other Ambulatory Visit: Payer: Self-pay | Admitting: Nephrology

## 2019-09-28 DIAGNOSIS — R109 Unspecified abdominal pain: Secondary | ICD-10-CM

## 2019-10-04 ENCOUNTER — Ambulatory Visit: Payer: Medicare Other | Admitting: Nurse Practitioner

## 2019-10-05 ENCOUNTER — Ambulatory Visit
Admission: RE | Admit: 2019-10-05 | Discharge: 2019-10-05 | Disposition: A | Discharge status: Home / Self Care | Payer: Medicare Other | Source: Ambulatory Visit | Attending: Nephrology | Admitting: Nephrology

## 2019-10-05 ENCOUNTER — Other Ambulatory Visit: Payer: Self-pay

## 2019-10-05 DIAGNOSIS — R109 Unspecified abdominal pain: Secondary | ICD-10-CM | POA: Diagnosis not present

## 2019-10-07 ENCOUNTER — Other Ambulatory Visit: Payer: Self-pay | Admitting: Nurse Practitioner

## 2019-10-11 ENCOUNTER — Ambulatory Visit: Payer: Medicare Other

## 2019-10-13 ENCOUNTER — Ambulatory Visit: Payer: Medicare Other

## 2019-10-20 ENCOUNTER — Ambulatory Visit (INDEPENDENT_AMBULATORY_CARE_PROVIDER_SITE_OTHER): Payer: Medicare Other

## 2019-10-20 DIAGNOSIS — Z0000 Encounter for general adult medical examination without abnormal findings: Secondary | ICD-10-CM | POA: Diagnosis not present

## 2019-10-20 DIAGNOSIS — Z1231 Encounter for screening mammogram for malignant neoplasm of breast: Secondary | ICD-10-CM | POA: Diagnosis not present

## 2019-10-20 NOTE — Progress Notes (Signed)
Subjective:   Rita [name] is a 57 y.o. female who presents for Medicare Annual (Subsequent) preventive examination.  Caregiver- [name] assisted with todays visit   This visit is being conducted via phone call  - after an attmept to do on video chat - due to the COVID-19 pandemic. This patient has given me verbal consent via phone to conduct this visit, patient states they are participating from their home address. Some vital signs may be absent or patient reported.   Patient identification: identified by name, DOB, and current address.    Review of Systems:   Cardiac Risk Factors include: advanced age (>44men, >57 women);dyslipidemia;hypertension     Objective:     Vitals: LMP  (LMP Unknown)   There is no height or weight on file to calculate BMI.  Advanced Directives 10/20/2019 07/02/2018 07/02/2017 07/15/2016 11/15/2015 11/07/2015 07/20/2015  Does Patient Have a Medical Advance Directive? Yes Yes Yes Yes Yes Yes No  Type of Advance Directive Living will;Healthcare Power of [place];Living will [place];Living will [place];Living will [place];Living will [place];Living will -  Does patient want to make changes to medical advance directive? - - - - No - Patient declined - -  Copy of [place] in Chart? Yes - validated most recent copy scanned in chart (See row information) Yes Yes - No - copy requested - -  Would patient like information on creating a medical advance directive? - - - - - - No - patient declined information  Some encounter information is confidential and restricted. Go to Review Flowsheets activity to see all data.    Tobacco Social History   Tobacco Use  Smoking Status Never Smoker  Smokeless Tobacco Never Used     Counseling given: Not Answered   Clinical Intake:  Pre-visit preparation completed: Yes  Pain : No/denies pain      Nutritional Risks: None Diabetes: No  How often do you need to have someone help you when you read instructions, pamphlets, or other written materials from your doctor or pharmacy?: 1 - Never  Interpreter Needed?: No  Information entered by :: Tiffany Hill,LPN  Past Medical History:  Diagnosis Date  . Adenomatous polyps   . Anxiety   . Chronic constipation   . Chronic tension headaches   . CKD (chronic kidney disease) stage 3, GFR 30-59 ml/min   . Depression   . Factitious disorder   . GERD (gastroesophageal reflux disease)   . Hyperlipidemia   . Hypertension   . Intellectual disability due to developmental disorder, unspecified   . Mental retardation   . Microscopic hematuria   . Migraine   . Obesity    Past Surgical History:  Procedure Laterality Date  . CARPAL TUNNEL RELEASE    . COLONOSCOPY WITH PROPOFOL N/A 06/27/2017   Procedure: COLONOSCOPY WITH PROPOFOL;  Surgeon: Manya Silvas, MD;  Location: [facility];  Service: Endoscopy;  Laterality: N/A;  . CYSTOSCOPY W/ RETROGRADES Bilateral 11/15/2015   Procedure: CYSTOSCOPY WITH RETROGRADE PYELOGRAM;  Surgeon: Hollice Espy, MD;  Location: [facility] ORS;  Service: Urology;  Laterality: Bilateral;  . US ECHOCARDIOGRAPHY  2012   showed mild heart failure (LVEF 52%); small pericardial effusion   Family History  Problem Relation Age of Onset  . Heart disease Father   . Hyperlipidemia Father   . Kidney disease Father   . Breast cancer Maternal Aunt   . Prostate  cancer Neg Hx    Social History   Socioeconomic History  . Marital status: Single    Spouse name: Not on file  . Number of children: Not on file  . Years of education: Not on file  . Highest education level: High school graduate  Occupational History  . Not on file  Social Needs  . Financial resource strain: Not hard at all  . Food insecurity    Worry: Never true    Inability: Never true  . Transportation needs    Medical: No    Non-medical: No   Tobacco Use  . Smoking status: Never Smoker  . Smokeless tobacco: Never Used  Substance and Sexual Activity  . Alcohol use: No  . Drug use: No  . Sexual activity: Never  Lifestyle  . Physical activity    Days per week: 0 days    Minutes per session: 0 min  . Stress: Not at all  Relationships  . Social connections    Talks on phone: More than three times a week    Gets together: More than three times a week    Attends religious service: More than 4 times per year    Active member of club or organization: Yes    Attends meetings of clubs or organizations: More than 4 times per year    Relationship status: Never married  Other Topics Concern  . Not on file  Social History Narrative   Goes to [employer]    Outpatient Encounter Medications as of 10/20/2019  Medication Sig  . acetaminophen (TYLENOL) 500 MG tablet Take 2 tablets (1,000 mg total) by mouth every 6 (six) hours as needed.  Marland Kitchen aspirin 81 MG chewable tablet TAKE 1 TABLET BY MOUTH ONCE DAILY.  Marland Kitchen atorvastatin (LIPITOR) 40 MG tablet Take 1 tablet (40 mg total) by mouth daily.  . cholecalciferol (VITAMIN D) 25 MCG (1000 UT) tablet Take 1 tablet (1,000 Units total) by mouth daily.  . clonazePAM (KLONOPIN) 0.5 MG tablet Take 1 tablet (0.5 mg total) by mouth 2 (two) times daily.  Marland Kitchen dextromethorphan (DELSYM) 30 MG/5ML liquid Take 2.5 mLs (15 mg total) by mouth as needed for cough.  . docusate sodium (COLACE) 100 MG capsule Take 1 capsule (100 mg total) by mouth 2 (two) times daily as needed for mild constipation.  . DULoxetine (CYMBALTA) 30 MG capsule Take 1 capsule (30 mg total) by mouth at bedtime.  . DULoxetine (CYMBALTA) 60 MG capsule 1 p.o. daily  . lisinopril (ZESTRIL) 5 MG tablet Take 1 tablet (5 mg total) by mouth daily.  . magnesium oxide (MAG-OX) 400 (241.3 Mg) MG tablet Take 1 tablet (400 mg total) by mouth daily.  . Multiple Vitamins-Minerals (TAB-A-VITE) TABS TAKE 1 TABLET BY MOUTH ONCE DAILY.  Marland Kitchen omeprazole (PRILOSEC) 20 MG  capsule TAKE (1) CAPSULE BY MOUTH ONCE DAILY.  Marland Kitchen QUEtiapine (SEROQUEL) 100 MG tablet Take 1 tablet (100 mg total) by mouth at bedtime.  . SUMAtriptan (IMITREX) 50 MG tablet Take 1 tablet (50 mg total) by mouth once a week. (Patient taking differently: Take 25 mg by mouth 2 (two) times a week. 1/2 tab mon and Friday)  . traZODone (DESYREL) 100 MG tablet Take 2 tablets (200 mg total) by mouth at bedtime.  . [DISCONTINUED] aspirin (ASPIRIN LOW DOSE) 81 MG EC tablet Take 1 tablet (81 mg total) by mouth daily. Swallow whole.  . carvedilol (COREG) 6.25 MG tablet Take 6.25 mg by mouth 2 (two) times daily.  . [DISCONTINUED]  hydrocortisone cream 1 % Apply 1 application topically 2 (two) times daily. (Patient not taking: Reported on 07/20/2019)  . [DISCONTINUED] Multiple Vitamins-Minerals (MULTIVITAMIN) tablet Take 1 tablet by mouth daily.  . [DISCONTINUED] neomycin-bacitracin-polymyxin (NEOSPORIN) [phone] ointment Apply 1 application topically daily as needed. (Patient not taking: Reported on 07/20/2019)   No facility-administered encounter medications on file as of 10/20/2019.     Activities of Daily Living In your present state of health, do you have any difficulty performing the following activities: 10/20/2019  Hearing? N  Comment no hearing aids  Vision? N  Comment eyeglasses, [facility]  Difficulty concentrating or making decisions? N  Walking or climbing stairs? N  Dressing or bathing? N  Doing errands, shopping? Y  Comment caretaker does  Conservation officer, nature and eating ? N  Comment caretaker helps with most food prep  Using the Toilet? N  In the past six months, have you accidently leaked urine? N  Do you have problems with loss of bowel control? N  Managing your Medications? Y  Comment caretaker helps  Managing your Finances? Y  Comment parents manage  Housekeeping or managing your Housekeeping? N  Some recent data might be hidden    Patient Care Team: Venita Lick, NP  as PCP - General (Nurse Practitioner) Kathrine Haddock, NP as PCP - Family Medicine (Nurse Practitioner) Anabel Bene, MD as Referring Physician (Neurology) Clapacs, Madie Reno, MD (Psychiatry) Anthonette Legato, MD (Internal Medicine)    Assessment:   This is a routine wellness examination for Alexander.  Exercise Activities and Dietary recommendations Current Exercise Habits: The patient does not participate in regular exercise at present, Exercise limited by: None identified  Goals    . DIET - INCREASE WATER INTAKE     Recommend continue to drink at least 6-8 glasses of water a day        Fall Risk: Fall Risk  10/20/2019 07/02/2018 01/09/2018 07/02/2017 07/15/2016  Falls in the past year? 0 No No No No  Number falls in past yr: 0 - - - -  Injury with Fall? 0 - - - -    FALL RISK PREVENTION PERTAINING TO THE HOME:  Any stairs in or around the home? No  If so, are there any without handrails? No   Home free of loose throw rugs in walkways, pet beds, electrical cords, etc? Yes  Adequate lighting in your home to reduce risk of falls? Yes   ASSISTIVE DEVICES UTILIZED TO PREVENT FALLS:  Life alert? No  Use of a cane, walker or w/c? No  Grab bars in the bathroom? No  Shower chair or bench in shower? No  Elevated toilet seat or a handicapped toilet? No   DME ORDERS:  DME order needed?  No   TIMED UP AND GO:  Unable to perform    Depression Screen PHQ 2/9 Scores 10/20/2019 07/14/2018 07/02/2018 01/09/2018  PHQ - 2 Score 2 1 2 1   PHQ- 9 Score 5 8 6 4      Cognitive Function     6CIT Screen 07/02/2018 07/02/2017  What Year? 0 points 0 points  What month? 0 points 0 points  What time? 0 points 0 points  Count back from 20 0 points 2 points  Months in reverse 0 points 0 points  Repeat phrase 0 points 2 points  Total Score 0 4    Immunization History  Administered Date(s) Administered  . Influenza,inj,Quad PF,6+ Mos 09/20/2015, 10/14/2016, 09/26/2017, 10/02/2018, 08/10/2019  .  Influenza-Unspecified  08/26/2014  . Td 10/20/2008    Qualifies for Shingles Vaccine? Yes  Zostavax completed n/a. Due for Shingrix. Education has been provided regarding the importance of this vaccine. Pt has been advised to call insurance company to determine out of pocket expense. Advised may also receive vaccine at local pharmacy or Health Dept. Verbalized acceptance and understanding.  Tdap: up to date  Flu Vaccine: up to date   Pneumococcal Vaccine: up to date   Screening Tests Health Maintenance  Topic Date Due  . PAP SMEAR-Modifier  02/21/2020 (Originally 02/21/2017)  . TETANUS/TDAP  07/19/2020 (Originally 10/20/2018)  . MAMMOGRAM  08/06/2020  . COLONOSCOPY  06/27/2022  . INFLUENZA VACCINE  Completed  . Hepatitis C Screening  Completed  . HIV Screening  Completed    Cancer Screenings:  Colorectal Screening: Completed 06/27/2017. Repeat every 5 years  Mammogram: Completed 08/06/2018 Repeat every year; ordered  Bone Density: n/a  Lung Cancer Screening: (Low Dose CT Chest recommended if Age 71-80 years, 30 pack-year currently smoking OR have quit w/in 15years.) does not qualify.    Additional Screening:  Hepatitis C Screening: does qualify; Completed 2017  Vision Screening: Recommended annual ophthalmology exams for early detection of glaucoma and other disorders of the eye. Is the patient up to date with their annual eye exam?  Yes  Who is the provider or what is the name of the office in which the pt attends annual eye exams? [facility]  Dental Screening: Recommended annual dental exams for proper oral hygiene  Community Resource Referral:  CRR required this visit?  No       Plan:  I have personally reviewed and addressed the Medicare Annual Wellness questionnaire and have noted the following in the patient's chart:  A. Medical and social history B. Use of alcohol, tobacco or illicit drugs  C. Current medications and supplements D. Functional ability  and status E.  Nutritional status F.  Physical activity G. Advance directives H. List of other physicians I.  Hospitalizations, surgeries, and ER visits in previous 12 months J.  [place] such as hearing and vision if needed, cognitive and depression L. Referrals and appointments   In addition, I have reviewed and discussed with patient certain preventive protocols, quality metrics, and best practice recommendations. A written personalized care plan for preventive services as well as general preventive health recommendations were provided to patient.  Signed,    Bevelyn Ngo, LPN  [facility] Nurse Health Advisor   Nurse Notes: none

## 2019-11-04 ENCOUNTER — Other Ambulatory Visit: Payer: Self-pay | Admitting: Nurse Practitioner

## 2019-12-07 ENCOUNTER — Other Ambulatory Visit: Payer: Self-pay | Admitting: Psychiatry

## 2019-12-30 ENCOUNTER — Other Ambulatory Visit: Payer: Self-pay | Admitting: Nurse Practitioner

## 2019-12-30 ENCOUNTER — Other Ambulatory Visit: Payer: Self-pay | Admitting: Psychiatry

## 2020-01-25 ENCOUNTER — Ambulatory Visit (INDEPENDENT_AMBULATORY_CARE_PROVIDER_SITE_OTHER): Payer: Medicare Other | Admitting: Nurse Practitioner

## 2020-01-25 ENCOUNTER — Other Ambulatory Visit: Payer: Self-pay

## 2020-01-25 ENCOUNTER — Encounter: Payer: Self-pay | Admitting: Nurse Practitioner

## 2020-01-25 VITALS — BP 122/83 | HR 84 | Temp 98.0°F

## 2020-01-25 DIAGNOSIS — Z6841 Body Mass Index (BMI) 40.0 and over, adult: Secondary | ICD-10-CM

## 2020-01-25 DIAGNOSIS — E782 Mixed hyperlipidemia: Secondary | ICD-10-CM | POA: Diagnosis not present

## 2020-01-25 DIAGNOSIS — E559 Vitamin D deficiency, unspecified: Secondary | ICD-10-CM

## 2020-01-25 DIAGNOSIS — N1832 Chronic kidney disease, stage 3b: Secondary | ICD-10-CM | POA: Diagnosis not present

## 2020-01-25 DIAGNOSIS — I10 Essential (primary) hypertension: Secondary | ICD-10-CM | POA: Diagnosis not present

## 2020-01-25 DIAGNOSIS — R05 Cough: Secondary | ICD-10-CM

## 2020-01-25 DIAGNOSIS — K5909 Other constipation: Secondary | ICD-10-CM

## 2020-01-25 DIAGNOSIS — R7301 Impaired fasting glucose: Secondary | ICD-10-CM

## 2020-01-25 DIAGNOSIS — E66813 Obesity, class 3: Secondary | ICD-10-CM

## 2020-01-25 DIAGNOSIS — Z1231 Encounter for screening mammogram for malignant neoplasm of breast: Secondary | ICD-10-CM

## 2020-01-25 DIAGNOSIS — R059 Cough, unspecified: Secondary | ICD-10-CM

## 2020-01-25 MED ORDER — DEXTROMETHORPHAN POLISTIREX ER 30 MG/5ML PO SUER: 15.0000 mg | ORAL | 12 refills | Status: DC | PRN

## 2020-01-25 MED ORDER — DOCUSATE SODIUM 100 MG PO CAPS: 100.0000 mg | ORAL_CAPSULE | Freq: Two times a day (BID) | ORAL | 12 refills | Status: DC | PRN

## 2020-01-25 MED ORDER — ACETAMINOPHEN 500 MG PO TABS: 1000.0000 mg | ORAL_TABLET | Freq: Four times a day (QID) | ORAL | 12 refills | Status: DC | PRN

## 2020-01-25 MED ORDER — ASPIRIN 81 MG PO CHEW: 81.0000 mg | CHEWABLE_TABLET | Freq: Every day | ORAL | 3 refills | Status: DC

## 2020-01-25 NOTE — Assessment & Plan Note (Signed)
Continue collaboration with nephrology and review recent labs once available.  Will obtain CMP today.

## 2020-01-25 NOTE — Progress Notes (Signed)
BP 122/83 (BP Location: Left Arm, Patient Position: Sitting, Cuff Size: Normal)   Pulse 84   Temp 98 F (36.7 C) (Oral)   LMP  (LMP Unknown)   SpO2 97%    Subjective:    Patient ID: Rita [name], female    DOB: 1961-08-12, 59 y.o.   MRN: [id number]  HPI: Rita [name] is a 59 y.o. female  Chief Complaint  Patient presents with  . Follow-up    HLD, HTN, Prediabetes   HYPERTENSION / HYPERLIPIDEMIA WITH CKD Last saw nephrology two months ago, labs in October GFR 42, CRT 1.38.  Continues on Atorvastatin and Lisinopril. Satisfied with current treatment? yes Duration of hypertension: chronic BP monitoring frequency: not checking BP range:  BP medication side effects: no Duration of hyperlipidemia: chronic Cholesterol medication side effects: no Cholesterol supplements: none Medication compliance: good compliance Aspirin: yes Recent stressors: no Recurrent headaches: no Visual changes: no Palpitations: no Dyspnea: no Chest pain: no Lower extremity edema: no Dizzy/lightheaded: no   IFG: No current medications, diet focused.  Recent A1C 5.7% 05/05/2019. Hypoglycemic episodes:no Polydipsia/polyuria: no Visual disturbance: no Chest pain: no Paresthesias: no   Relevant past medical, surgical, family and social history reviewed and updated as indicated. Interim medical history since our last visit reviewed. Allergies and medications reviewed and updated.  Review of Systems  Constitutional: Negative for activity change, appetite change, diaphoresis, fatigue and fever.  Respiratory: Negative for cough, chest tightness and shortness of breath.   Cardiovascular: Negative for chest pain, palpitations and leg swelling.  Gastrointestinal: Negative for abdominal distention, abdominal pain, constipation, diarrhea, nausea and vomiting.  Endocrine: Negative for cold intolerance, heat intolerance, polydipsia, polyphagia and polyuria.  Neurological: Negative for dizziness, syncope,  weakness, light-headedness, numbness and headaches.  Psychiatric/Behavioral: Negative.     Per HPI unless specifically indicated above     Objective:    BP 122/83 (BP Location: Left Arm, Patient Position: Sitting, Cuff Size: Normal)   Pulse 84   Temp 98 F (36.7 C) (Oral)   LMP  (LMP Unknown)   SpO2 97%   Wt Readings from Last 3 Encounters:  07/20/19 220 lb 3.2 oz (99.9 kg)  01/19/19 213 lb 4 oz (96.7 kg)  07/14/18 211 lb (95.7 kg)    Physical Exam Vitals and nursing note reviewed.  Constitutional:      General: She is awake. She is not in acute distress.    Appearance: She is well-developed. She is obese. She is not ill-appearing.  HENT:     Head: Normocephalic.     Right Ear: Hearing normal.     Left Ear: Hearing normal.  Eyes:     General: Lids are normal.        Right eye: No discharge.        Left eye: No discharge.     Conjunctiva/sclera: Conjunctivae normal.     Pupils: Pupils are equal, round, and reactive to light.  Neck:     Thyroid: No thyromegaly.     Vascular: No carotid bruit.  Cardiovascular:     Rate and Rhythm: Normal rate and regular rhythm.     Heart sounds: Normal heart sounds. No murmur. No gallop.   Pulmonary:     Effort: Pulmonary effort is normal. No accessory muscle usage or respiratory distress.     Breath sounds: Normal breath sounds.  Abdominal:     General: Bowel sounds are normal.     Palpations: Abdomen is soft.  Musculoskeletal:  Cervical back: Normal range of motion and neck supple.     Right lower leg: No edema.     Left lower leg: No edema.  Lymphadenopathy:     Cervical: No cervical adenopathy.  Skin:    General: Skin is warm and dry.  Neurological:     Mental Status: She is alert and oriented to person, place, and time.  Psychiatric:        Attention and Perception: Attention normal.        Mood and Affect: Mood normal.        Speech: Speech normal.        Behavior: Behavior normal. Behavior is cooperative.         Thought Content: Thought content normal.     Results for orders placed or performed in visit on 07/20/19  Lipid Panel w/o Chol/HDL Ratio  Result Value Ref Range   Cholesterol, Total 225 (H) 100 - 199 mg/dL   Triglycerides 384 (H) 0 - 149 mg/dL   HDL 50 >39 mg/dL   VLDL Cholesterol Cal 77 (H) 5 - 40 mg/dL   LDL Calculated 98 0 - 99 mg/dL  Hepatic function panel  Result Value Ref Range   Total Protein 6.6 6.0 - 8.5 g/dL   Albumin 4.1 3.8 - 4.9 g/dL   Bilirubin Total 0.2 0.0 - 1.2 mg/dL   Bilirubin, Direct 0.05 0.00 - 0.40 mg/dL   Alkaline Phosphatase 124 (H) 39 - 117 IU/L   AST 25 0 - 40 IU/L   ALT 39 (H) 0 - 32 IU/L  VITAMIN D 25 Hydroxy (Vit-D Deficiency, Fractures)  Result Value Ref Range   Vit D, 25-Hydroxy 30.5 30.0 - 100.0 ng/mL      Assessment & Plan:   Problem List Items Addressed This Visit      Cardiovascular and Mediastinum   Essential hypertension    Chronic, stable with BP at goal today.  Continue current medication regimen and collaboration with nephrology.  Lisinopril for kidney protection.  Recommend checking BP at home on occasion and documenting.  Return in 6 months.      Relevant Medications   aspirin 81 MG chewable tablet     Digestive   Chronic constipation   Relevant Medications   docusate sodium (COLACE) 100 MG capsule     Endocrine   IFG (impaired fasting glucose)    Recommend continued diet focus, cutting back on soda and drinking more water + weight loss.  A1C at end of June 5.7%.  No current medications.  Recheck A1C today.      Relevant Orders   HgB A1c     Genitourinary   Chronic kidney disease, stage 3    Continue collaboration with nephrology and review recent labs once available.  Will obtain CMP today.      Relevant Orders   Comprehensive metabolic panel     Other   Hyperlipidemia    Chronic, ongoing.  Continue current medication regimen and adjust as needed.  Lipid panel and LFT's today.        Relevant Medications    aspirin 81 MG chewable tablet   Other Relevant Orders   Lipid Panel w/o Chol/HDL Ratio   Obesity - Primary    Recommend continued focus on healthy diet choices and regular physical activity (30 minutes 5 days a week).        Other Visit Diagnoses    Vitamin D deficiency       Takes daily supplement.  Recheck  Vit D level today   Relevant Orders   VITAMIN D 25 Hydroxy (Vit-D Deficiency, Fractures)   Encounter for screening mammogram for malignant neoplasm of breast       Mammogram ordered.   Relevant Orders   MM DIGITAL SCREENING BILATERAL   Cough       Refills sent on PRN cough medication   Relevant Medications   dextromethorphan (DELSYM) 30 MG/5ML liquid       Follow up plan: Return in about 6 months (around 07/24/2020) for HLD, MOOD, Vit D, CKD, PREDIABETES.

## 2020-01-25 NOTE — Assessment & Plan Note (Signed)
Recommend continued focus on healthy diet choices and regular physical activity (30 minutes 5 days a week).  

## 2020-01-25 NOTE — Assessment & Plan Note (Signed)
Chronic, ongoing.  Continue current medication regimen and adjust as needed.  Lipid panel and LFT's today.   °

## 2020-01-25 NOTE — Assessment & Plan Note (Signed)
Recommend continued diet focus, cutting back on soda and drinking more water + weight loss.  A1C at end of June 5.7%.  No current medications.  Recheck A1C today.

## 2020-01-25 NOTE — Patient Instructions (Signed)
Healthy Eating Following a healthy eating pattern may help you to achieve and maintain a healthy body weight, reduce the risk of chronic disease, and live a long and productive life. It is important to follow a healthy eating pattern at an appropriate calorie level for your body. Your nutritional needs should be met primarily through food by choosing a variety of nutrient-rich foods. What are tips for following this plan? Reading food labels  Read labels and choose the following: ? Reduced or low sodium. ? Juices with 100% fruit juice. ? Foods with low saturated fats and high polyunsaturated and monounsaturated fats. ? Foods with whole grains, such as whole wheat, cracked wheat, brown rice, and wild rice. ? Whole grains that are fortified with folic acid. This is recommended for women who are pregnant or who want to become pregnant.  Read labels and avoid the following: ? Foods with a lot of added sugars. These include foods that contain brown sugar, corn sweetener, corn syrup, dextrose, fructose, glucose, high-fructose corn syrup, honey, invert sugar, lactose, malt syrup, maltose, molasses, raw sugar, sucrose, trehalose, or turbinado sugar.  Do not eat more than the following amounts of added sugar per day:  6 teaspoons (25 g) for women.  9 teaspoons (38 g) for men. ? Foods that contain processed or refined starches and grains. ? Refined grain products, such as white flour, degermed cornmeal, white bread, and white rice. Shopping  Choose nutrient-rich snacks, such as vegetables, whole fruits, and nuts. Avoid high-calorie and high-sugar snacks, such as potato chips, fruit snacks, and candy.  Use oil-based dressings and spreads on foods instead of solid fats such as butter, stick margarine, or cream cheese.  Limit pre-made sauces, mixes, and "instant" products such as flavored rice, instant noodles, and ready-made pasta.  Try more plant-protein sources, such as tofu, tempeh, black beans,  edamame, lentils, nuts, and seeds.  Explore eating plans such as the Mediterranean diet or vegetarian diet. Cooking  Use oil to saut or stir-fry foods instead of solid fats such as butter, stick margarine, or lard.  Try baking, boiling, grilling, or broiling instead of frying.  Remove the fatty part of meats before cooking.  Steam vegetables in water or broth. Meal planning   At meals, imagine dividing your plate into fourths: ? One-half of your plate is fruits and vegetables. ? One-fourth of your plate is whole grains. ? One-fourth of your plate is protein, especially lean meats, poultry, eggs, tofu, beans, or nuts.  Include low-fat dairy as part of your daily diet. Lifestyle  Choose healthy options in all settings, including home, work, school, restaurants, or stores.  Prepare your food safely: ? Wash your hands after handling raw meats. ? Keep food preparation surfaces clean by regularly washing with hot, soapy water. ? Keep raw meats separate from ready-to-eat foods, such as fruits and vegetables. ? Cook seafood, meat, poultry, and eggs to the recommended internal temperature. ? Store foods at safe temperatures. In general:  Keep cold foods at 59F (4.4C) or below.  Keep hot foods at 159F (60C) or above.  Keep your freezer at [facility] (-17.8C) or below.  Foods are no longer safe to eat when they have been between the temperatures of 40-159F (4.4-60C) for more than 2 hours. What foods should I eat? Fruits Aim to eat 2 cup-equivalents of fresh, canned (in natural juice), or frozen fruits each day. Examples of 1 cup-equivalent of fruit include 1 small apple, 8 large strawberries, 1 cup canned fruit,  cup  dried fruit, or 1 cup 100% juice. Vegetables Aim to eat 2-3 cup-equivalents of fresh and frozen vegetables each day, including different varieties and colors. Examples of 1 cup-equivalent of vegetables include 2 medium carrots, 2 cups raw, leafy greens, 1 cup chopped  vegetable (raw or cooked), or 1 medium baked potato. Grains Aim to eat 6 ounce-equivalents of whole grains each day. Examples of 1 ounce-equivalent of grains include 1 slice of bread, 1 cup ready-to-eat cereal, 3 cups popcorn, or  cup cooked rice, pasta, or cereal. Meats and other proteins Aim to eat 5-6 ounce-equivalents of protein each day. Examples of 1 ounce-equivalent of protein include 1 egg, 1/2 cup nuts or seeds, or 1 tablespoon (16 g) peanut butter. A cut of meat or fish that is the size of a deck of cards is about 3-4 ounce-equivalents.  Of the protein you eat each week, try to have at least 8 ounces come from seafood. This includes salmon, trout, herring, and anchovies. Dairy Aim to eat 3 cup-equivalents of fat-free or low-fat dairy each day. Examples of 1 cup-equivalent of dairy include 1 cup (240 mL) milk, 8 ounces (250 g) yogurt, 1 ounces (44 g) natural cheese, or 1 cup (240 mL) fortified soy milk. Fats and oils  Aim for about 5 teaspoons (21 g) per day. Choose monounsaturated fats, such as canola and olive oils, avocados, peanut butter, and most nuts, or polyunsaturated fats, such as sunflower, corn, and soybean oils, walnuts, pine nuts, sesame seeds, sunflower seeds, and flaxseed. Beverages  Aim for six 8-oz glasses of water per day. Limit coffee to three to five 8-oz cups per day.  Limit caffeinated beverages that have added calories, such as soda and energy drinks.  Limit alcohol intake to no more than 1 drink a day for nonpregnant women and 2 drinks a day for men. One drink equals 12 oz of beer (355 mL), 5 oz of wine (148 mL), or 1 oz of hard liquor (44 mL). Seasoning and other foods  Avoid adding excess amounts of salt to your foods. Try flavoring foods with herbs and spices instead of salt.  Avoid adding sugar to foods.  Try using oil-based dressings, sauces, and spreads instead of solid fats. This information is based on general [place] nutrition guidelines. For more  information, visit [employer]. Exact amounts may vary based on your nutrition needs. Summary  A healthy eating plan may help you to maintain a healthy weight, reduce the risk of chronic diseases, and stay active throughout your life.  Plan your meals. Make sure you eat the right portions of a variety of nutrient-rich foods.  Try baking, boiling, grilling, or broiling instead of frying.  Choose healthy options in all settings, including home, work, school, restaurants, or stores. This information is not intended to replace advice given to you by your health care provider. Make sure you discuss any questions you have with your health care provider. Document Revised: 03/02/2018 Document Reviewed: 03/02/2018 Elsevier Patient Education  [place].

## 2020-01-25 NOTE — Assessment & Plan Note (Signed)
Chronic, stable with BP at goal today.  Continue current medication regimen and collaboration with nephrology.  Lisinopril for kidney protection.  Recommend checking BP at home on occasion and documenting.  Return in 6 months. 

## 2020-01-26 LAB — COMPREHENSIVE METABOLIC PANEL
ALT: 31 IU/L (ref 0–32)
AST: 24 IU/L (ref 0–40)
Albumin/Globulin Ratio: 1.5 (ref 1.2–2.2)
Albumin: 4.5 g/dL (ref 3.8–4.9)
Alkaline Phosphatase: 130 IU/L — ABNORMAL HIGH (ref 39–117)
BUN/Creatinine Ratio: 9 (ref 9–23)
BUN: 13 mg/dL (ref 6–24)
Bilirubin Total: 0.3 mg/dL (ref 0.0–1.2)
CO2: 23 mmol/L (ref 20–29)
Calcium: 9.8 mg/dL (ref 8.7–10.2)
Chloride: 99 mmol/L (ref 96–106)
Creatinine, Ser: 1.4 mg/dL — ABNORMAL HIGH (ref 0.57–1.00)
GFR calc Af Amer: 48 mL/min/{1.73_m2} — ABNORMAL LOW (ref >59)
GFR calc non Af Amer: 41 mL/min/{1.73_m2} — ABNORMAL LOW (ref >59)
Globulin, Total: 3 g/dL (ref 1.5–4.5)
Glucose: 87 mg/dL (ref 65–99)
Potassium: 5 mmol/L (ref 3.5–5.2)
Sodium: 138 mmol/L (ref 134–144)
Total Protein: 7.5 g/dL (ref 6.0–8.5)

## 2020-01-26 LAB — LIPID PANEL W/O CHOL/HDL RATIO
Cholesterol, Total: 225 mg/dL — ABNORMAL HIGH (ref 100–199)
HDL: 54 mg/dL (ref >39)
LDL Chol Calc (NIH): 118 mg/dL — ABNORMAL HIGH (ref 0–99)
Triglycerides: 303 mg/dL — ABNORMAL HIGH (ref 0–149)
VLDL Cholesterol Cal: 53 mg/dL — ABNORMAL HIGH (ref 5–40)

## 2020-01-26 LAB — HEMOGLOBIN A1C
Est. average glucose Bld gHb Est-mCnc: 111 mg/dL
Hgb A1c MFr Bld: 5.5 % (ref 4.8–5.6)

## 2020-01-26 LAB — VITAMIN D 25 HYDROXY (VIT D DEFICIENCY, FRACTURES): Vit D, 25-Hydroxy: 31 ng/mL (ref 30.0–100.0)

## 2020-01-26 NOTE — Progress Notes (Signed)
Please let Mirari and her guardian know labs have returned: - A1C, diabetes testing, shows NO diabetes or prediabetes at this time - Vitamin D level is normal - Kidney function continues to show stable kidney disease with no decline and liver function tests have improved - Cholesterol levels remain elevated.  Was she fasting on these labs?  If so then we may want to increase her Atorvastatin to 80 MG daily vs 40 MG daily, especially since liver testing is stable.  If we do this I would want to see her again in 4 -6 weeks for recheck and visit.  Continue focus on healthy diet.   Have a great day and let me know if any questions or concerns.

## 2020-01-27 ENCOUNTER — Telehealth: Payer: Self-pay

## 2020-01-27 NOTE — Telephone Encounter (Signed)
Copied from [place] [id number]. Topic: General - Other >> Jan 27, 2020  4:22 PM Rainey Pines A wrote: Patient would like a copy of most recent lab work mailed to her. Verified home address

## 2020-02-28 ENCOUNTER — Other Ambulatory Visit: Payer: Self-pay | Admitting: Nurse Practitioner

## 2020-02-28 NOTE — Telephone Encounter (Signed)
Requested Prescriptions  Pending Prescriptions Disp Refills  . traZODone (DESYREL) 100 MG tablet [Pharmacy Med Name: TRAZODONE 100 MG TABLET] 60 tablet     Sig: TAKE 2 TABLETS BY MOUTH AT BEDTIME     Psychiatry: Antidepressants - Serotonin Modulator Passed - 02/28/2020  7:58 AM      Passed - Completed PHQ-2 or PHQ-9 in the last 360 days.      Passed - Valid encounter within last 6 months    Recent Outpatient Visits          1 month ago Class 3 severe obesity due to excess calories with serious comorbidity and body mass index (BMI) of 40.0 to 44.9 in adult [facility])   [place], Jolene T, NP   7 months ago Chronic kidney disease, stage 3 ([place])   [place] Cannady, Barbaraann Faster, NP   1 year ago Essential hypertension   [place], [place] T, NP   1 year ago Annual physical exam   [facility] Trinna Post, PA-C   2 years ago Toenail deformity   [facility] Kathrine Haddock, NP      Future Appointments            In 1 week Cannady, Barbaraann Faster, NP [facility], [place]   In 4 months [place], Barbaraann Faster, NP [facility], [facility]   In 7 months  [facility], [place]           . Multiple Vitamins-Minerals (TAB-A-VITE) TABS [Pharmacy Med Name: MULTIVITAMIN TABLET] 30 tablet 0    Sig: TAKE 1 TABLET BY MOUTH ONCE DAILY.     There is no refill protocol information for this order    . QUEtiapine (SEROQUEL) 100 MG tablet [Pharmacy Med Name: QUETIAPINE FUMARATE 100 MG TAB] 30 tablet     Sig: TAKE ONE TABLET BY MOUTH AT BEDTIME.     Not Delegated - Psychiatry:  Antipsychotics - Second Generation (Atypical) - quetiapine Failed - 02/28/2020  7:58 AM      Failed - This refill cannot be delegated      Passed - ALT in normal range and within 180 days    ALT  Date Value Ref Range Status  01/25/2020 31 0 - 32 IU/L Final   SGPT (ALT)  Date Value Ref Range Status  12/27/2013 25 12 - 78 U/L  Final         Passed - AST in normal range and within 180 days    AST  Date Value Ref Range Status  01/25/2020 24 0 - 40 IU/L Final   SGOT(AST)  Date Value Ref Range Status  12/27/2013 15 15 - 37 Unit/L Final         Passed - Completed PHQ-2 or PHQ-9 in the last 360 days.      Passed - Last BP in normal range    BP Readings from Last 1 Encounters:  01/25/20 122/83         Passed - Valid encounter within last 6 months    Recent Outpatient Visits          1 month ago Class 3 severe obesity due to excess calories with serious comorbidity and body mass index (BMI) of 40.0 to 44.9 in adult [facility])   [place], Jolene T, NP   7 months ago Chronic kidney disease, stage 3 ([place])   [place] Cannady, Henrine Screws T, NP   1 year ago  Essential hypertension   [facility], Henrine Screws T, NP   1 year ago Annual physical exam   [facility] Trinna Post, PA-C   2 years ago Toenail deformity   [facility] Kathrine Haddock, NP      Future Appointments            In 1 week Cannady, Barbaraann Faster, NP [facility], [place]   In 4 months [place], Barbaraann Faster, NP [facility], [facility]   In 7 months  [facility]           . DULoxetine (CYMBALTA) 60 MG capsule [Pharmacy Med Name: DULOXETINE HCL DR 60 MG CAP] 30 capsule     Sig: TAKE (1) CAPSULE BY MOUTH ONCE DAILY.     Psychiatry: Antidepressants - SNRI Passed - 02/28/2020  7:58 AM      Passed - Completed PHQ-2 or PHQ-9 in the last 360 days.      Passed - Last BP in normal range    BP Readings from Last 1 Encounters:  01/25/20 122/83         Passed - Valid encounter within last 6 months    Recent Outpatient Visits          1 month ago Class 3 severe obesity due to excess calories with serious comorbidity and body mass index (BMI) of 40.0 to 44.9 in adult [facility])   [place], Jolene T, NP   7 months ago Chronic  kidney disease, stage 3 ([place])   [place] Cannady, Barbaraann Faster, NP   1 year ago Essential hypertension   [place], [place] T, NP   1 year ago Annual physical exam   [facility] Trinna Post, PA-C   2 years ago Toenail deformity   [facility] Kathrine Haddock, NP      Future Appointments            In 1 week Cannady, Barbaraann Faster, NP [facility], [place]   In 4 months [place], Barbaraann Faster, NP [facility], [place]   In 7 months  [facility], [place]           . DULoxetine (CYMBALTA) 30 MG capsule [Pharmacy Med Name: DULOXETINE HCL DR 30 MG CAP] 30 capsule     Sig: TAKE 1 CAPSULE BY MOUTH AT BEDTIME     Psychiatry: Antidepressants - SNRI Passed - 02/28/2020  7:58 AM      Passed - Completed PHQ-2 or PHQ-9 in the last 360 days.      Passed - Last BP in normal range    BP Readings from Last 1 Encounters:  01/25/20 122/83         Passed - Valid encounter within last 6 months    Recent Outpatient Visits          1 month ago Class 3 severe obesity due to excess calories with serious comorbidity and body mass index (BMI) of 40.0 to 44.9 in adult [facility])   [place], Jolene T, NP   7 months ago Chronic kidney disease, stage 3 ([place])   [place] Cannady, Barbaraann Faster, NP   1 year ago Essential hypertension   [place], Henrine Screws T, NP   1 year ago Annual physical exam   [facility] Trinna Post, PA-C   2 years ago Toenail deformity   [place],  [name], NP      Future Appointments            In 1 week Cannady, Barbaraann Faster, NP [facility], [place]   In 4 months [place], Barbaraann Faster, NP [facility], [place]   In 7 months  [facility] [place]

## 2020-03-01 ENCOUNTER — Other Ambulatory Visit: Payer: Self-pay | Admitting: Psychiatry

## 2020-03-07 ENCOUNTER — Ambulatory Visit: Payer: Medicare Other | Admitting: Nurse Practitioner

## 2020-03-08 ENCOUNTER — Telehealth: Payer: Self-pay

## 2020-03-08 NOTE — Telephone Encounter (Signed)
Patient called requesting a refill on her Clonazepam 0.5mg  to be sent to [facility] on [address] in [place]. Patient stated that she's completely out. Thank you.

## 2020-03-09 ENCOUNTER — Other Ambulatory Visit: Payer: Self-pay | Admitting: Psychiatry

## 2020-03-09 MED ORDER — CLONAZEPAM 0.5 MG PO TABS: 0.5000 mg | ORAL_TABLET | Freq: Two times a day (BID) | ORAL | 5 refills | Status: DC

## 2020-03-13 DIAGNOSIS — R519 Headache, unspecified: Secondary | ICD-10-CM | POA: Diagnosis not present

## 2020-03-13 DIAGNOSIS — G43709 Chronic migraine without aura, not intractable, without status migrainosus: Secondary | ICD-10-CM | POA: Diagnosis not present

## 2020-03-14 ENCOUNTER — Encounter: Payer: Self-pay | Admitting: Nurse Practitioner

## 2020-03-14 ENCOUNTER — Ambulatory Visit (INDEPENDENT_AMBULATORY_CARE_PROVIDER_SITE_OTHER): Payer: Medicare Other | Admitting: Nurse Practitioner

## 2020-03-14 ENCOUNTER — Other Ambulatory Visit: Payer: Self-pay

## 2020-03-14 VITALS — BP 115/76 | HR 71 | Temp 98.3°F | Wt 222.0 lb

## 2020-03-14 DIAGNOSIS — E782 Mixed hyperlipidemia: Secondary | ICD-10-CM | POA: Diagnosis not present

## 2020-03-14 NOTE — Progress Notes (Signed)
BP 115/76   Pulse 71   Temp 98.3 F (36.8 C) (Oral)   Wt 222 lb (100.7 kg)   LMP  (LMP Unknown)   SpO2 99%   BMI 45.60 kg/m    Subjective:    Patient ID: Rita [name], female    DOB: 07-Jul-1961, 59 y.o.   MRN: [id number]  HPI: Rita [name] is a 59 y.o. female  Chief Complaint  Patient presents with  . Follow-up   HYPERLIPIDEMIA Continues on Atorvastatin 40 MG and last visit had discussed increasing this, but she wished to focus on diet.  They have been walking outside frequently and increasing water intake.   Hyperlipidemia status: good compliance Satisfied with current treatment?  yes Side effects:  no Medication compliance: good compliance Past cholesterol meds: atorvastain (lipitor) Supplements: none Aspirin:  yes The 10-year ASCVD risk score Mikey Bussing DC Jr., et al., 2013) is: 3.3%   Values used to calculate the score:     Age: 70 years     Sex: Female     Is Non-Hispanic African American: No     Diabetic: No     Tobacco smoker: No     Systolic Blood Pressure: [id number]     Is BP treated: Yes     HDL Cholesterol: 54 mg/dL     Total Cholesterol: 225 mg/dL Chest pain:  no Coronary artery disease:  no Family history CAD:  no Family history early CAD:  no  Relevant past medical, surgical, family and social history reviewed and updated as indicated. Interim medical history since our last visit reviewed. Allergies and medications reviewed and updated.  Review of Systems  Constitutional: Negative for activity change, appetite change, diaphoresis, fatigue and fever.  Respiratory: Negative for cough, chest tightness and shortness of breath.   Cardiovascular: Negative for chest pain, palpitations and leg swelling.  Gastrointestinal: Negative.   Endocrine: Negative for cold intolerance, heat intolerance, polydipsia, polyphagia and polyuria.  Neurological: Negative.   Psychiatric/Behavioral: Negative.     Per HPI unless specifically indicated above     Objective:     BP 115/76   Pulse 71   Temp 98.3 F (36.8 C) (Oral)   Wt 222 lb (100.7 kg)   LMP  (LMP Unknown)   SpO2 99%   BMI 45.60 kg/m   Wt Readings from Last 3 Encounters:  03/14/20 222 lb (100.7 kg)  07/20/19 220 lb 3.2 oz (99.9 kg)  01/19/19 213 lb 4 oz (96.7 kg)    Physical Exam Vitals and nursing note reviewed.  Constitutional:      General: She is awake. She is not in acute distress.    Appearance: She is well-developed. She is obese. She is not ill-appearing.  HENT:     Head: Normocephalic.     Right Ear: Hearing normal.     Left Ear: Hearing normal.  Eyes:     General: Lids are normal.        Right eye: No discharge.        Left eye: No discharge.     Conjunctiva/sclera: Conjunctivae normal.     Pupils: Pupils are equal, round, and reactive to light.  Neck:     Thyroid: No thyromegaly.     Vascular: No carotid bruit.  Cardiovascular:     Rate and Rhythm: Normal rate and regular rhythm.     Heart sounds: Normal heart sounds. No murmur. No gallop.   Pulmonary:     Effort: Pulmonary effort is normal.  No accessory muscle usage or respiratory distress.     Breath sounds: Normal breath sounds.  Abdominal:     General: Bowel sounds are normal.     Palpations: Abdomen is soft.  Musculoskeletal:     Cervical back: Normal range of motion and neck supple.     Right lower leg: No edema.     Left lower leg: No edema.  Lymphadenopathy:     Cervical: No cervical adenopathy.  Skin:    General: Skin is warm and dry.  Neurological:     Mental Status: She is alert and oriented to person, place, and time.  Psychiatric:        Attention and Perception: Attention normal.        Mood and Affect: Mood normal.        Speech: Speech normal.        Behavior: Behavior normal. Behavior is cooperative.        Thought Content: Thought content normal.     Results for orders placed or performed in visit on 01/25/20  Comprehensive metabolic panel  Result Value Ref Range   Glucose 87 65  - 99 mg/dL   BUN 13 6 - 24 mg/dL   Creatinine, Ser 1.40 (H) 0.57 - 1.00 mg/dL   GFR calc non Af Amer 41 (L) >59 mL/min/1.73   GFR calc Af Amer 48 (L) >59 mL/min/1.73   BUN/Creatinine Ratio 9 9 - 23   Sodium 138 134 - 144 mmol/L   Potassium 5.0 3.5 - 5.2 mmol/L   Chloride 99 96 - 106 mmol/L   CO2 23 20 - 29 mmol/L   Calcium 9.8 8.7 - 10.2 mg/dL   Total Protein 7.5 6.0 - 8.5 g/dL   Albumin 4.5 3.8 - 4.9 g/dL   Globulin, Total 3.0 1.5 - 4.5 g/dL   Albumin/Globulin Ratio 1.5 1.2 - 2.2   Bilirubin Total 0.3 0.0 - 1.2 mg/dL   Alkaline Phosphatase 130 (H) 39 - 117 IU/L   AST 24 0 - 40 IU/L   ALT 31 0 - 32 IU/L  Lipid Panel w/o Chol/HDL Ratio  Result Value Ref Range   Cholesterol, Total 225 (H) 100 - 199 mg/dL   Triglycerides 303 (H) 0 - 149 mg/dL   HDL 54 >39 mg/dL   VLDL Cholesterol Cal 53 (H) 5 - 40 mg/dL   LDL Chol Calc (NIH) 118 (H) 0 - 99 mg/dL  VITAMIN D 25 Hydroxy (Vit-D Deficiency, Fractures)  Result Value Ref Range   Vit D, 25-Hydroxy 31.0 30.0 - 100.0 ng/mL  HgB A1c  Result Value Ref Range   Hgb A1c MFr Bld 5.5 4.8 - 5.6 %   Est. average glucose Bld gHb Est-mCnc 111 mg/dL      Assessment & Plan:   Problem List Items Addressed This Visit      Other   Hyperlipidemia - Primary    Chronic, ongoing.  Continue current medication regimen and adjust as needed.  Lipid panel today.  Could consider continued diet focus or increase in Atorvastatin if elevated LDL and TCHOL above goal.  ASCVD currently 3.3%.        Relevant Orders   Lipid Panel w/o Chol/HDL Ratio       Follow up plan: Return in about 6 months (around 09/13/2020) for HLD, MOOD, CKD, HTN, GERD.

## 2020-03-14 NOTE — Assessment & Plan Note (Signed)
Chronic, ongoing.  Continue current medication regimen and adjust as needed.  Lipid panel today.  Could consider continued diet focus or increase in Atorvastatin if elevated LDL and TCHOL above goal.  ASCVD currently 3.3%.

## 2020-03-14 NOTE — Patient Instructions (Signed)
Fat and Cholesterol Restricted Eating Plan Getting too much fat and cholesterol in your diet may cause health problems. Choosing the right foods helps keep your fat and cholesterol at normal levels. This can keep you from getting certain diseases. Your doctor may recommend an eating plan that includes:  Total fat: ______% or less of total calories a day.  Saturated fat: ______% or less of total calories a day.  Cholesterol: less than _________mg a day.  Fiber: ______g a day. What are tips for following this plan? Meal planning  At meals, divide your plate into four equal parts: ? Fill one-half of your plate with vegetables and green salads. ? Fill one-fourth of your plate with whole grains. ? Fill one-fourth of your plate with low-fat (lean) protein foods.  Eat fish that is high in omega-3 fats at least two times a week. This includes mackerel, tuna, sardines, and salmon.  Eat foods that are high in fiber, such as whole grains, beans, apples, broccoli, carrots, peas, and barley. General tips   Work with your doctor to lose weight if you need to.  Avoid: ? Foods with added sugar. ? Fried foods. ? Foods with partially hydrogenated oils.  Limit alcohol intake to no more than 1 drink a day for nonpregnant women and 2 drinks a day for men. One drink equals 12 oz of beer, 5 oz of wine, or 1 oz of hard liquor. Reading food labels  Check food labels for: ? Trans fats. ? Partially hydrogenated oils. ? Saturated fat (g) in each serving. ? Cholesterol (mg) in each serving. ? Fiber (g) in each serving.  Choose foods with healthy fats, such as: ? Monounsaturated fats. ? Polyunsaturated fats. ? Omega-3 fats.  Choose grain products that have whole grains. Look for the word "whole" as the first word in the ingredient list. Cooking  Cook foods using low-fat methods. These include baking, boiling, grilling, and broiling.  Eat more home-cooked foods. Eat at restaurants and buffets  less often.  Avoid cooking using saturated fats, such as butter, cream, palm oil, palm kernel oil, and coconut oil. Recommended foods  Fruits  All fresh, canned (in natural juice), or frozen fruits. Vegetables  Fresh or frozen vegetables (raw, steamed, roasted, or grilled). Green salads. Grains  Whole grains, such as whole wheat or whole grain breads, crackers, cereals, and pasta. Unsweetened oatmeal, bulgur, barley, quinoa, or brown rice. Corn or whole wheat flour tortillas. Meats and other protein foods  Ground beef (85% or leaner), grass-fed beef, or beef trimmed of fat. Skinless chicken or turkey. Ground chicken or turkey. Pork trimmed of fat. All fish and seafood. Egg whites. Dried beans, peas, or lentils. Unsalted nuts or seeds. Unsalted canned beans. Nut butters without added sugar or oil. Dairy  Low-fat or nonfat dairy products, such as skim or 1% milk, 2% or reduced-fat cheeses, low-fat and fat-free ricotta or cottage cheese, or plain low-fat and nonfat yogurt. Fats and oils  Tub margarine without trans fats. Light or reduced-fat mayonnaise and salad dressings. Avocado. Olive, canola, sesame, or safflower oils. The items listed above may not be a complete list of foods and beverages you can eat. Contact a dietitian for more information. Foods to avoid Fruits  Canned fruit in heavy syrup. Fruit in cream or butter sauce. Fried fruit. Vegetables  Vegetables cooked in cheese, cream, or butter sauce. Fried vegetables. Grains  White bread. White pasta. White rice. Cornbread. Bagels, pastries, and croissants. Crackers and snack foods that contain trans fat   and hydrogenated oils. Meats and other protein foods  Fatty cuts of meat. Ribs, chicken wings, bacon, sausage, bologna, salami, chitterlings, fatback, hot dogs, bratwurst, and packaged lunch meats. Liver and organ meats. Whole eggs and egg yolks. Chicken and turkey with skin. Fried meat. Dairy  Whole or 2% milk, cream,  half-and-half, and cream cheese. Whole milk cheeses. Whole-fat or sweetened yogurt. Full-fat cheeses. Nondairy creamers and whipped toppings. Processed cheese, cheese spreads, and cheese curds. Beverages  Alcohol. Sugar-sweetened drinks such as sodas, lemonade, and fruit drinks. Fats and oils  Butter, stick margarine, lard, shortening, ghee, or bacon fat. Coconut, palm kernel, and palm oils. Sweets and desserts  Corn syrup, sugars, honey, and molasses. Candy. Jam and jelly. Syrup. Sweetened cereals. Cookies, pies, cakes, donuts, muffins, and ice cream. The items listed above may not be a complete list of foods and beverages you should avoid. Contact a dietitian for more information. Summary  Choosing the right foods helps keep your fat and cholesterol at normal levels. This can keep you from getting certain diseases.  At meals, fill one-half of your plate with vegetables and green salads.  Eat high-fiber foods, like whole grains, beans, apples, carrots, peas, and barley.  Limit added sugar, saturated fats, alcohol, and fried foods. This information is not intended to replace advice given to you by your health care provider. Make sure you discuss any questions you have with your health care provider. Document Revised: 07/22/2018 Document Reviewed: 08/05/2017 Elsevier Patient Education  2020 Elsevier Inc.  

## 2020-03-15 ENCOUNTER — Other Ambulatory Visit: Payer: Self-pay | Admitting: Nurse Practitioner

## 2020-03-15 ENCOUNTER — Telehealth: Payer: Self-pay | Admitting: Nurse Practitioner

## 2020-03-15 DIAGNOSIS — E782 Mixed hyperlipidemia: Secondary | ICD-10-CM

## 2020-03-15 LAB — LIPID PANEL W/O CHOL/HDL RATIO
Cholesterol, Total: 217 mg/dL — ABNORMAL HIGH (ref 100–199)
HDL: 49 mg/dL (ref >39)
LDL Chol Calc (NIH): 119 mg/dL — ABNORMAL HIGH (ref 0–99)
Triglycerides: 283 mg/dL — ABNORMAL HIGH (ref 0–149)
VLDL Cholesterol Cal: 49 mg/dL — ABNORMAL HIGH (ref 5–40)

## 2020-03-15 MED ORDER — ATORVASTATIN CALCIUM 80 MG PO TABS: 80.0000 mg | ORAL_TABLET | Freq: Every day | ORAL | 3 refills | Status: DC

## 2020-03-15 NOTE — Progress Notes (Signed)
Pt has lab apt on 04/25/2020

## 2020-03-15 NOTE — Telephone Encounter (Signed)
Copied from [place] [phone]. Topic: General - Call Back - No Documentation >> Mar 15, 2020  4:43 PM Erick Blinks wrote: Best contact [phone] Pt would like a call back about her lab results, has questions.

## 2020-03-15 NOTE — Progress Notes (Signed)
Please let Rita [name] know her cholesterol levels remain elevated, she would benefit from increase in Atorvastatin to 80 MG.  Currently she takes 40 MG.  If she wishes to continue to focus on diet until next visit, that is okay, but if ongoing elevation I will recommend increase in Atorvastatin.  Please let me know if you would like to increase now or wait and focus more on diet changes.  Have a great day!!

## 2020-03-16 NOTE — Telephone Encounter (Signed)
Tried calling patient back, went straight to VM. Will try again later.

## 2020-03-17 NOTE — Telephone Encounter (Signed)
Called and LVM asking for patient to please return our call if she still has questions.

## 2020-03-21 ENCOUNTER — Other Ambulatory Visit: Payer: Self-pay | Admitting: Nurse Practitioner

## 2020-03-30 ENCOUNTER — Other Ambulatory Visit: Payer: Self-pay | Admitting: Nurse Practitioner

## 2020-03-30 NOTE — Telephone Encounter (Signed)
Routing to provider  

## 2020-03-30 NOTE — Telephone Encounter (Signed)
She is followed by psychiatry and these medications were all filled by them.  Can we check on this please?  Make sure she schedules follow-up with psychiatry for her mental health medications.  Thank you.

## 2020-03-30 NOTE — Telephone Encounter (Signed)
Requested medication (s) are due for refill today -yes  Requested medication (s) are on the active medication list -yes  Future visit scheduled -yes  Last refill: 01/05/20  Notes to clinic: Request RF of Rx from outside provider  Requested Prescriptions  Pending Prescriptions Disp Refills   QUEtiapine (SEROQUEL) 100 MG tablet [Pharmacy Med Name: QUETIAPINE FUMARATE 100 MG TAB] 30 tablet 0    Sig: TAKE ONE TABLET BY MOUTH AT BEDTIME.      Not Delegated - Psychiatry:  Antipsychotics - Second Generation (Atypical) - quetiapine Failed - 03/30/2020  1:31 PM      Failed - This refill cannot be delegated      Passed - ALT in normal range and within 180 days    ALT  Date Value Ref Range Status  01/25/2020 31 0 - 32 IU/L Final   SGPT (ALT)  Date Value Ref Range Status  12/27/2013 25 12 - 78 U/L Final          Passed - AST in normal range and within 180 days    AST  Date Value Ref Range Status  01/25/2020 24 0 - 40 IU/L Final   SGOT(AST)  Date Value Ref Range Status  12/27/2013 15 15 - 37 Unit/L Final          Passed - Completed PHQ-2 or PHQ-9 in the last 360 days.      Passed - Last BP in normal range    BP Readings from Last 1 Encounters:  03/14/20 115/76          Passed - Valid encounter within last 6 months    Recent Outpatient Visits           2 weeks ago Mixed hyperlipidemia   [place], Jolene T, NP   2 months ago Class 3 severe obesity due to excess calories with serious comorbidity and body mass index (BMI) of 40.0 to 44.9 in adult [facility])   [place], Jolene T, NP   8 months ago Chronic kidney disease, stage 3 ([place])   [place] Cannady, Barbaraann Faster, NP   1 year ago Essential hypertension   [place], Henrine Screws T, NP   1 year ago Annual physical exam   [place] Trinna Post, PA-C       Future Appointments             In 5 months Cannady, Barbaraann Faster, NP  [facility]   In 6 months  [facility]                Requested Prescriptions  Pending Prescriptions Disp Refills   QUEtiapine (SEROQUEL) 100 MG tablet [Pharmacy Med Name: QUETIAPINE FUMARATE 100 MG TAB] 30 tablet 0    Sig: TAKE ONE TABLET BY MOUTH AT BEDTIME.      Not Delegated - Psychiatry:  Antipsychotics - Second Generation (Atypical) - quetiapine Failed - 03/30/2020  1:31 PM      Failed - This refill cannot be delegated      Passed - ALT in normal range and within 180 days    ALT  Date Value Ref Range Status  01/25/2020 31 0 - 32 IU/L Final   SGPT (ALT)  Date Value Ref Range Status  12/27/2013 25 12 - 78 U/L Final          Passed - AST in normal range and within 180 days    AST  Date Value Ref Range Status  01/25/2020 24 0 - 40 IU/L Final   SGOT(AST)  Date Value Ref Range Status  12/27/2013 15 15 - 37 Unit/L Final          Passed - Completed PHQ-2 or PHQ-9 in the last 360 days.      Passed - Last BP in normal range    BP Readings from Last 1 Encounters:  03/14/20 115/76          Passed - Valid encounter within last 6 months    Recent Outpatient Visits           2 weeks ago Mixed hyperlipidemia   [place], Jolene T, NP   2 months ago Class 3 severe obesity due to excess calories with serious comorbidity and body mass index (BMI) of 40.0 to 44.9 in adult [facility])   [place], Jolene T, NP   8 months ago Chronic kidney disease, stage 3 ([place])   [place] Cannady, Barbaraann Faster, NP   1 year ago Essential hypertension   [place], On[place] T, NP   1 year ago Annual physical exam   [place] Trinna Post, PA-C       Future Appointments             In 5 months Cannady, Barbaraann Faster, NP [facility]   In 6 months  [facility], [place]

## 2020-03-31 NOTE — Telephone Encounter (Signed)
Patient does get this from psych.

## 2020-04-03 ENCOUNTER — Other Ambulatory Visit: Payer: Self-pay | Admitting: Psychiatry

## 2020-04-03 MED ORDER — TRAZODONE HCL 100 MG PO TABS: 200.0000 mg | ORAL_TABLET | Freq: Every day | ORAL | 3 refills | Status: DC

## 2020-04-06 ENCOUNTER — Telehealth (INDEPENDENT_AMBULATORY_CARE_PROVIDER_SITE_OTHER): Payer: Medicare Other | Admitting: Psychiatry

## 2020-04-06 ENCOUNTER — Encounter: Payer: Self-pay | Admitting: Psychiatry

## 2020-04-06 ENCOUNTER — Other Ambulatory Visit: Payer: Self-pay

## 2020-04-06 DIAGNOSIS — F259 Schizoaffective disorder, unspecified: Secondary | ICD-10-CM

## 2020-04-06 DIAGNOSIS — F819 Developmental disorder of scholastic skills, unspecified: Secondary | ICD-10-CM

## 2020-04-06 MED ORDER — TRAZODONE HCL 100 MG PO TABS: 200.0000 mg | ORAL_TABLET | Freq: Every day | ORAL | 3 refills | Status: DC

## 2020-04-06 MED ORDER — CLONAZEPAM 0.5 MG PO TABS: 0.5000 mg | ORAL_TABLET | Freq: Two times a day (BID) | ORAL | 5 refills | Status: DC

## 2020-04-06 MED ORDER — DULOXETINE HCL 30 MG PO CPEP: 30.0000 mg | ORAL_CAPSULE | Freq: Every day | ORAL | 5 refills | Status: DC

## 2020-04-06 MED ORDER — QUETIAPINE FUMARATE 100 MG PO TABS: 100.0000 mg | ORAL_TABLET | Freq: Every day | ORAL | 5 refills | Status: DC

## 2020-04-06 NOTE — Progress Notes (Signed)
Patient seen chart reviewed. Patient was spoken to on the phone. I identified her and myself. Conversation was for 15 minutes. Patient is not reporting any new psychiatric symptoms. Denies depression denies suicidal thoughts denies psychotic symptoms.  Patient has her usual constricted affect. Thoughts are simple but stable no evidence of psychosis. I spoke with the caretaker working with her who reports that she continues to be stable with no new behavior problems. Renewed all medications. Check up again in another 6 months

## 2020-04-11 ENCOUNTER — Telehealth: Payer: Self-pay

## 2020-04-11 NOTE — Telephone Encounter (Signed)
they want her sister to have guardianship her sister name Rita [name]. they need a letter that states that [place] needs a guardian please fax letter to  faxed to [phone]

## 2020-04-17 ENCOUNTER — Telehealth: Payer: Self-pay

## 2020-04-17 NOTE — Telephone Encounter (Signed)
Copied from [place] [phone]. Topic: General - Other >> Apr 17, 2020  9:15 AM Alanda Slim E wrote: Reason for [id number] Rita [name] needs a letter to give the courts for gaurdianship of the Pt/ the letter is needed before June 1st./ if you have any question please call Olin Hauser (caregiver) / Olin Hauser states [name] was the Pts Dr and knows about the Pts history/ Pt does not make good decisions with her health or eating habits/ please advise

## 2020-04-17 NOTE — Telephone Encounter (Signed)
Can we find out more information on what is needed for this.  What expectation for letter is?  If it is for competency that would be better coming from psychiatry who monitors her mental health.  I can write letter stating lives in group home setting and does require assistance with daily ADLs and decision making.

## 2020-04-18 ENCOUNTER — Other Ambulatory Visit: Payer: Self-pay | Admitting: Nurse Practitioner

## 2020-04-18 ENCOUNTER — Encounter: Payer: Self-pay | Admitting: Nurse Practitioner

## 2020-04-18 NOTE — Telephone Encounter (Signed)
Requested Prescriptions  Pending Prescriptions Disp Refills  . aspirin 81 MG chewable tablet [Pharmacy Med Name: ASPIRIN 81 MG CHEWABLE TABLET] 90 tablet 1    Sig: TAKE 1 TABLET BY MOUTH ONCE DAILY.     Analgesics:  NSAIDS - aspirin Passed - 04/18/2020 10:36 AM      Passed - Patient is not pregnant      Passed - Valid encounter within last 12 months    Recent Outpatient Visits          1 month ago Mixed hyperlipidemia   [place], Jolene T, NP   2 months ago Class 3 severe obesity due to excess calories with serious comorbidity and body mass index (BMI) of 40.0 to 44.9 in adult [facility])   [place], Jolene T, NP   9 months ago Chronic kidney disease, stage 3 ([place])   [place] Cannady, Barbaraann Faster, NP   1 year ago Essential hypertension   [place], [place] T, NP   1 year ago Annual physical exam   [place] Trinna Post, PA-C      Future Appointments            In 4 months Cannady, Barbaraann Faster, NP [facility]   In 6 months  [facility], [place]

## 2020-04-18 NOTE — Telephone Encounter (Signed)
Patient's sister is trying to take guardianship, Jeannene Patella states that the patient will give out her address to anyone online and that she will call 911 when not needed, that she puts herself in dangerous situations which she is not aware of, they are concerned for her safety.

## 2020-04-18 NOTE — Telephone Encounter (Signed)
Spoke further to [place] on phone, have written letter and recommended they also obtain letter from patient's psychiatrist.  Letter on rack for Rita [name] to pick up, please alert her this is ready.

## 2020-04-19 NOTE — Telephone Encounter (Signed)
Letter faxed per request from [place].

## 2020-04-25 ENCOUNTER — Other Ambulatory Visit: Payer: Self-pay

## 2020-04-25 ENCOUNTER — Telehealth: Payer: Self-pay | Admitting: Nurse Practitioner

## 2020-04-25 ENCOUNTER — Other Ambulatory Visit: Payer: Medicare Other

## 2020-04-25 DIAGNOSIS — E782 Mixed hyperlipidemia: Secondary | ICD-10-CM | POA: Diagnosis not present

## 2020-04-25 LAB — LIPID PANEL PICCOLO, WAIVED
Chol/HDL Ratio Piccolo,Waive: 3.3 mg/dL
Cholesterol Piccolo, Waived: 189 mg/dL (ref <200)
HDL Chol Piccolo, Waived: 57 mg/dL — ABNORMAL LOW (ref >59)
LDL Chol Calc Piccolo Waived: 70 mg/dL (ref <100)
Triglycerides Piccolo,Waived: 310 mg/dL — ABNORMAL HIGH (ref <150)
VLDL Chol Calc Piccolo,Waive: 62 mg/dL — ABNORMAL HIGH (ref <30)

## 2020-04-25 NOTE — Telephone Encounter (Signed)
Thank you.  I knew there had been discussion on this a few times and was not recommended.  Thank you for calling caregiver.:)

## 2020-04-25 NOTE — Telephone Encounter (Signed)
Called pamela pt's caregiver and relayed Jolene's message, she states that she does not want to schedule right now said that they have spoken to pt's kidney doctor and he is against it due to her condition. Olin Hauser states that she will discuss it with pt's sister.

## 2020-04-25 NOTE — Telephone Encounter (Signed)
We have discussed weight loss at previous visits.  She is aware she would benefit from referral to weight loss provider for further work-up and recommendations.  Please alert her that she would need appointment to further discuss this with her and her caregiver.  Thank you.:)

## 2020-04-25 NOTE — Telephone Encounter (Signed)
Pt presented in office with a letter stating that she would like to see about starting contrave (naltrexone HCI/bupropion HCI) for weight loss. Please advise.

## 2020-04-26 ENCOUNTER — Telehealth: Payer: Self-pay | Admitting: Nurse Practitioner

## 2020-04-26 LAB — COMPREHENSIVE METABOLIC PANEL
ALT: 34 IU/L — ABNORMAL HIGH (ref 0–32)
AST: 25 IU/L (ref 0–40)
Albumin/Globulin Ratio: 1.4 (ref 1.2–2.2)
Albumin: 4.2 g/dL (ref 3.8–4.9)
Alkaline Phosphatase: 141 IU/L — ABNORMAL HIGH (ref 48–121)
BUN/Creatinine Ratio: 10 (ref 9–23)
BUN: 14 mg/dL (ref 6–24)
Bilirubin Total: <0.2 mg/dL (ref 0.0–1.2)
CO2: 21 mmol/L (ref 20–29)
Calcium: 9.7 mg/dL (ref 8.7–10.2)
Chloride: 104 mmol/L (ref 96–106)
Creatinine, Ser: 1.35 mg/dL — ABNORMAL HIGH (ref 0.57–1.00)
GFR calc Af Amer: 50 mL/min/{1.73_m2} — ABNORMAL LOW (ref >59)
GFR calc non Af Amer: 43 mL/min/{1.73_m2} — ABNORMAL LOW (ref >59)
Globulin, Total: 3 g/dL (ref 1.5–4.5)
Glucose: 109 mg/dL — ABNORMAL HIGH (ref 65–99)
Potassium: 4.3 mmol/L (ref 3.5–5.2)
Sodium: 142 mmol/L (ref 134–144)
Total Protein: 7.2 g/dL (ref 6.0–8.5)

## 2020-04-26 NOTE — Telephone Encounter (Signed)
Copied from [place] [phone]. Topic: General - Inquiry >> Apr 26, 2020  2:01 PM Alease Frame wrote: Reason for CRM: pt is wanting a call back from someone in the office . Pt was seen yesterday and wanted to know her cholesterol reading . Please advise

## 2020-04-26 NOTE — Telephone Encounter (Signed)
Called and LVM letting patient know result note from [facility] that was given to caregiver.

## 2020-04-26 NOTE — Progress Notes (Signed)
Please let Rita [name] and caregiver know that her cholesterol levels have improved with dose change, her LDL (bad cholesterol) went from 119 to now 70.  Much better.  Remainder of labs are continuing at her baseline, including kidney function.  Continue current statin dose and have a great day!!

## 2020-05-19 ENCOUNTER — Other Ambulatory Visit: Payer: Self-pay | Admitting: Nurse Practitioner

## 2020-05-19 NOTE — Telephone Encounter (Signed)
Requested Prescriptions  Pending Prescriptions Disp Refills  . Multiple Vitamins-Minerals (TAB-A-VITE) TABS [Pharmacy Med Name: MULTIVITAMIN TABLET] 30 tablet 5    Sig: TAKE 1 TABLET BY MOUTH ONCE DAILY.     There is no refill protocol information for this order    . atorvastatin (LIPITOR) 80 MG tablet [Pharmacy Med Name: ATORVASTATIN 80 MG TABLET] 30 tablet 0    Sig: TAKE 1 TABLET BY MOUTH ONCE DAILY.     Cardiovascular:  Antilipid - Statins Failed - 05/19/2020 11:42 AM      Failed - HDL in normal range and within 360 days    HDL Cholesterol  Date Value Ref Range Status  09/30/2012 61 (H) 40 - 60 mg/dL Final   HDL  Date Value Ref Range Status  03/14/2020 49 >39 mg/dL Final         Failed - Triglycerides in normal range and within 360 days    Triglycerides  Date Value Ref Range Status  09/30/2012 136 0 - 200 mg/dL Final   Triglycerides Piccolo,Waived  Date Value Ref Range Status  04/25/2020 310 (H) <150 mg/dL Final    Comment:                            Normal                   <150                         Borderline High     150 - 199                         High                200 - 499                         Very High                >499          Passed - Total Cholesterol in normal range and within 360 days    Cholesterol  Date Value Ref Range Status  09/30/2012 189 0 - 200 mg/dL Final   Cholesterol Piccolo, Waived  Date Value Ref Range Status  04/25/2020 189 <200 mg/dL Final    Comment:                            Desirable                <200                         Borderline High      200- 239                         High                     >239          Passed - LDL in normal range and within 360 days    Ldl Cholesterol, Calc  Date Value Ref Range Status  09/30/2012 101 (H) 0 - 100 mg/dL Final   LDL Chol Calc (NIH)  Date Value Ref Range Status  03/14/2020 119 (H) 0 -  99 mg/dL Final   LDL Direct  Date Value Ref Range Status  07/14/2018 129 (H) 0  - 99 mg/dL Final         Passed - Patient is not pregnant      Passed - Valid encounter within last 12 months    Recent Outpatient Visits          2 months ago Mixed hyperlipidemia   [place], Jolene T, NP   3 months ago Class 3 severe obesity due to excess calories with serious comorbidity and body mass index (BMI) of 40.0 to 44.9 in adult [facility])   [place], Jolene T, NP   10 months ago Chronic kidney disease, stage 3 ([place])   [place] Cannady, Barbaraann Faster, NP   1 year ago Essential hypertension   [place], Henrine Screws T, NP   1 year ago Annual physical exam   [place] Trinna Post, PA-C      Future Appointments            In 3 months Cannady, Barbaraann Faster, NP [facility]   In 5 months  [facility] [place]           called [facility] re: Atorvastatin; spoke with Lytle Michaels.  Advised that Atorvastatin was reordered 03/15/20; #90; RF x 3.  Lytle Michaels confirmed that the Pharmacy did receive that order.  He stated to disregard request, and further explained that the pt. Receives "Compliance packaging", therefore only able to receive 30 tablets at a time.

## 2020-05-31 ENCOUNTER — Other Ambulatory Visit: Payer: Self-pay

## 2020-05-31 ENCOUNTER — Ambulatory Visit
Admission: RE | Admit: 2020-05-31 | Discharge: 2020-05-31 | Disposition: A | Discharge status: Home / Self Care | Payer: Medicare Other | Source: Ambulatory Visit | Attending: Nurse Practitioner | Admitting: Nurse Practitioner

## 2020-05-31 ENCOUNTER — Encounter (INDEPENDENT_AMBULATORY_CARE_PROVIDER_SITE_OTHER): Payer: Self-pay

## 2020-05-31 DIAGNOSIS — Z1231 Encounter for screening mammogram for malignant neoplasm of breast: Secondary | ICD-10-CM | POA: Diagnosis not present

## 2020-06-06 DIAGNOSIS — R809 Proteinuria, unspecified: Secondary | ICD-10-CM | POA: Diagnosis not present

## 2020-06-06 DIAGNOSIS — H35073 Retinal telangiectasis, bilateral: Secondary | ICD-10-CM | POA: Diagnosis not present

## 2020-06-06 DIAGNOSIS — N2581 Secondary hyperparathyroidism of renal origin: Secondary | ICD-10-CM | POA: Diagnosis not present

## 2020-06-06 DIAGNOSIS — I10 Essential (primary) hypertension: Secondary | ICD-10-CM | POA: Diagnosis not present

## 2020-06-06 DIAGNOSIS — N1832 Chronic kidney disease, stage 3b: Secondary | ICD-10-CM | POA: Diagnosis not present

## 2020-06-12 ENCOUNTER — Other Ambulatory Visit: Payer: Self-pay | Admitting: Nurse Practitioner

## 2020-06-12 DIAGNOSIS — I10 Essential (primary) hypertension: Secondary | ICD-10-CM

## 2020-06-12 NOTE — Telephone Encounter (Signed)
Routing to provider

## 2020-06-12 NOTE — Telephone Encounter (Signed)
Requested medication (s) are due for refill today:yes  Requested medication (s) are on the active medication list:yes  Last refill:  05/19/2020  Future visit scheduled: yes  Notes to clinic: this refill cannot be delegated    Requested Prescriptions  Pending Prescriptions Disp Refills   cholecalciferol (VITAMIN D) 25 MCG (1000 UNIT) tablet [Pharmacy Med Name: VITAMIN D3 1,000 UNITS TAB] 30 tablet 0    Sig: TAKE 1 TABLET BY MOUTH ONCE DAILY.      Endocrinology:  Vitamins - Vitamin D Supplementation Failed - 06/12/2020 10:25 AM      Failed - 50,000 IU strengths are not delegated      Failed - Phosphate in normal range and within 360 days    No results found for: PHOS        Passed - Ca in normal range and within 360 days    Calcium  Date Value Ref Range Status  04/25/2020 9.7 8.7 - 10.2 mg/dL Final   Calcium, Total  Date Value Ref Range Status  12/28/2013 8.6 8.5 - 10.1 mg/dL Final          Passed - Vitamin D in normal range and within 360 days    Vit D, 25-Hydroxy  Date Value Ref Range Status  01/25/2020 31.0 30.0 - 100.0 ng/mL Final    Comment:    Vitamin D deficiency has been defined by the Institute of Medicine and an Endocrine Society practice guideline as a level of serum 25-OH vitamin D less than 20 ng/mL (1,2). The Endocrine Society went on to further define vitamin D insufficiency as a level between 21 and 29 ng/mL (2). 1. IOM (Institute of Medicine). 2010. Dietary reference    intakes for calcium and D. [place]: The    [employer]. 2. Holick MF, Bink[place], Bischoff-Ferrari HA, et al.    Evaluation, treatment, and prevention of vitamin D    deficiency: an Endocrine Society clinical practice    guideline. JCEM. 2011 Jul; 96(7):1911-30.           Passed - Valid encounter within last 12 months    Recent Outpatient Visits           3 months ago Mixed hyperlipidemia   [place], Jolene T, NP   4 months ago Class  3 severe obesity due to excess calories with serious comorbidity and body mass index (BMI) of 40.0 to 44.9 in adult [facility])   [place], Jolene T, NP   10 months ago Chronic kidney disease, stage 3 ([place])   [place] Cannady, Barbaraann Faster, NP   1 year ago Essential hypertension   [place], Henrine Screws T, NP   1 year ago Annual physical exam   [place] Trinna Post, PA-C       Future Appointments             In 3 months Cannady, Barbaraann Faster, NP [facility]   In 4 months  [facility]             Signed Prescriptions Disp Refills   omeprazole (PRILOSEC) 20 MG capsule 30 capsule 0    Sig: TAKE (1) CAPSULE BY MOUTH ONCE DAILY.      Gastroenterology: Proton Pump Inhibitors Passed - 06/12/2020 10:25 AM      Passed - Valid encounter within last 12 months    Recent Outpatient Visits  3 months ago Mixed hyperlipidemia   [place], [place] T, NP   4 months ago Class 3 severe obesity due to excess calories with serious comorbidity and body mass index (BMI) of 40.0 to 44.9 in adult [facility])   [place], Jolene T, NP   10 months ago Chronic kidney disease, stage 3 ([place])   [place] Cannady, Barbaraann Faster, NP   1 year ago Essential hypertension   [place], [place] T, NP   1 year ago Annual physical exam   [place] Trinna Post, PA-C       Future Appointments             In 3 months Cannady, Barbaraann Faster, NP [facility]   In 4 months  [facility]              magnesium oxide (MAG-OX) 400 (241.3 Mg) MG tablet 30 tablet 0    Sig: TAKE 1 TABLET BY MOUTH ONCE DAILY.      Endocrinology:  Minerals - Magnesium Supplementation Failed - 06/12/2020 10:25 AM      Failed - Mg Level in normal range and within 360 days    Magnesium  Date Value Ref  Range Status  12/28/2013 1.7 (L) mg/dL Final    Comment:    1.8-2.4 THERAPEUTIC RANGE: 4-7 mg/dL TOXIC: > 10 mg/dL  -----------------------           Passed - Valid encounter within last 12 months    Recent Outpatient Visits           3 months ago Mixed hyperlipidemia   [place], Jolene T, NP   4 months ago Class 3 severe obesity due to excess calories with serious comorbidity and body mass index (BMI) of 40.0 to 44.9 in adult [facility])   [place], Jolene T, NP   10 months ago Chronic kidney disease, stage 3 ([place])   [place] Cannady, Barbaraann Faster, NP   1 year ago Essential hypertension   [place], [place] T, NP   1 year ago Annual physical exam   [place] Trinna Post, PA-C       Future Appointments             In 3 months Cannady, Barbaraann Faster, NP [facility]   In 4 months  [facility]              lisinopril (ZESTRIL) 5 MG tablet 30 tablet 0    Sig: TAKE 1 TABLET BY MOUTH ONCE DAILY.      Cardiovascular:  ACE Inhibitors Failed - 06/12/2020 10:25 AM      Failed - Cr in normal range and within 180 days    Creatinine  Date Value Ref Range Status  12/28/2013 1.36 (H) 0.60 - 1.30 mg/dL Final   Creatinine, Ser  Date Value Ref Range Status  04/25/2020 1.35 (H) 0.57 - 1.00 mg/dL Final          Passed - K in normal range and within 180 days    Potassium  Date Value Ref Range Status  04/25/2020 4.3 3.5 - 5.2 mmol/L Final  12/28/2013 3.8 3.5 - 5.1 mmol/L Final          Passed - Patient is not pregnant      Passed - Last BP in normal range  BP Readings from Last 1 Encounters:  03/14/20 115/76          Passed - Valid encounter within last 6 months    Recent Outpatient Visits           3 months ago Mixed hyperlipidemia   [place], Jolene T, NP   4 months ago Class 3 severe obesity due to excess  calories with serious comorbidity and body mass index (BMI) of 40.0 to 44.9 in adult [facility])   [place], Jolene T, NP   10 months ago Chronic kidney disease, stage 3 ([place])   [place] Cannady, Barbaraann Faster, NP   1 year ago Essential hypertension   [place], [place] T, NP   1 year ago Annual physical exam   [place] Trinna Post, PA-C       Future Appointments             In 3 months Cannady, Barbaraann Faster, NP [facility]   In 4 months  [facility] [place]

## 2020-06-22 ENCOUNTER — Ambulatory Visit (INDEPENDENT_AMBULATORY_CARE_PROVIDER_SITE_OTHER): Payer: Medicare Other | Admitting: Nurse Practitioner

## 2020-06-22 ENCOUNTER — Encounter: Payer: Self-pay | Admitting: Nurse Practitioner

## 2020-06-22 ENCOUNTER — Other Ambulatory Visit: Payer: Self-pay

## 2020-06-22 DIAGNOSIS — M5441 Lumbago with sciatica, right side: Secondary | ICD-10-CM | POA: Diagnosis not present

## 2020-06-22 MED ORDER — GABAPENTIN 300 MG PO CAPS
300.0000 mg | ORAL_CAPSULE | Freq: Every day | ORAL | 3 refills | Status: DC
Start: 2020-06-22 — End: 2021-04-17

## 2020-06-22 NOTE — Patient Instructions (Signed)
Leg Cramps Leg cramps occur when one or more muscles tighten and you have no control over this tightening (involuntary muscle contraction). Muscle cramps can develop in any muscle, but the most common place is in the calf muscles of the leg. Those cramps can occur during exercise or when you are at rest. Leg cramps are painful, and they may last for a few seconds to a few minutes. Cramps may return several times before they finally stop. Usually, leg cramps are not caused by a serious medical problem. In many cases, the cause is not known. Some common causes include:  Excessive physical effort (overexertion), such as during intense exercise.  Overuse from repetitive motions, or doing the same thing over and over.  Staying in a certain position for a long period of time.  Improper preparation, form, or technique while performing a sport or an activity.  Dehydration.  Injury.  Side effects of certain medicines.  Abnormally low levels of minerals in your blood (electrolytes), especially potassium and calcium. This could result from: ? Pregnancy. ? Taking diuretic medicines. Follow these instructions at home: Eating and drinking  Drink enough fluid to keep your urine pale yellow. Staying hydrated may help prevent cramps.  Eat a healthy diet that includes plenty of nutrients to help your muscles function. A healthy diet includes fruits and vegetables, lean protein, whole grains, and low-fat or nonfat dairy products. Managing pain, stiffness, and swelling      Try massaging, stretching, and relaxing the affected muscle. Do this for several minutes at a time.  If directed, put ice on areas that are sore or painful after a cramp: ? Put ice in a plastic bag. ? Place a towel between your skin and the bag. ? Leave the ice on for 20 minutes, 2-3 times a day.  If directed, apply heat to muscles that are tense or tight. Do this before you exercise, or as often as told by your health care  provider. Use the heat source that your health care provider recommends, such as a moist heat pack or a heating pad. ? Place a towel between your skin and the heat source. ? Leave the heat on for 20-30 minutes. ? Remove the heat if your skin turns bright red. This is especially important if you are unable to feel pain, heat, or cold. You may have a greater risk of getting burned.  Try taking hot showers or baths to help relax tight muscles. General instructions  If you are having frequent leg cramps, avoid intense exercise for several days.  Take over-the-counter and prescription medicines only as told by your health care provider.  Keep all follow-up visits as told by your health care provider. This is important. Contact a health care provider if:  Your leg cramps get more severe or more frequent, or they do not improve over time.  Your foot becomes cold, numb, or blue. Summary  Muscle cramps can develop in any muscle, but the most common place is in the calf muscles of the leg.  Leg cramps are painful, and they may last for a few seconds to a few minutes.  Usually, leg cramps are not caused by a serious medical problem. Often, the cause is not known.  Stay hydrated and take over-the-counter and prescription medicines only as told by your health care provider. This information is not intended to replace advice given to you by your health care provider. Make sure you discuss any questions you have with your health care  provider. Document Revised: 10/31/2017 Document Reviewed: 08/28/2017 Elsevier Patient Education  2020 Elsevier Inc.  

## 2020-06-22 NOTE — Assessment & Plan Note (Signed)
Acute for a few weeks, had imaging with nephrology noting degenerative disc lumbar spine.  Will trial renal dosed Gabapentin, 300 MG at HS and recommend she continue Tylenol as needed.  May take 1000 MG TID as needed.  Avoid Ibuprofen.  Recommend she continue to hydrate well and stretch daily.  Discussed she would benefit from some modest weight loss, continue to work on diet and exercise.  Return to office for any worsening or ongoing symptoms.

## 2020-06-22 NOTE — Progress Notes (Signed)
BP 128/80   Pulse 78   Temp 98.1 F (36.7 C) (Oral)   Wt 217 lb 3.2 oz (98.5 kg)   LMP  (LMP Unknown)   SpO2 96%   BMI 44.62 kg/m    Subjective:    Patient ID: Rita [name], female    DOB: June 24, 1961, 59 y.o.   MRN: [id number]  HPI: Rita [name] is a 58 y.o. female  Chief Complaint  Patient presents with  . Leg Pain    top of R thigh pain per patient. States it has been hurting for a few weeks. Has taken tylenol, used ice and heat as well    RIGHT THIGH PAIN & BACK PAIN Reports right thigh pain x one month.  Had CT at with nephrology, Dr. Holley Raring, due to her complaint lower back pain at that time -- was found to have some degenerative changes of lumbar spine.  Recent labs with kidney provider -- H/H 12.1/37.2, CRT 1.24, GFR 48.  Her caregiver reports that as child patient had lots of leg issues with walking.  Current CrCl 77  Of note Atorvastatin was increased to 80 MG in April due to LDL not at goal, there was improvement in LDL with this. Duration: weeks Mechanism of injury: unknown Location: lower back and into right thigh Onset: sudden Severity: 7/10 Quality: dull and aching Frequency: intermittent Radiation: right leg above knee Aggravating factors: movement and walking Alleviating factors: Tylenol, heat and ice Status: fluctuating Treatments attempted: Tylenol, heat and ice Relief with NSAIDs?: No NSAIDs Taken Nighttime pain:  no Paresthesias / decreased sensation:  no Bowel / bladder incontinence:  no Fevers:  no Dysuria / urinary frequency:  no  Relevant past medical, surgical, family and social history reviewed and updated as indicated. Interim medical history since our last visit reviewed. Allergies and medications reviewed and updated.  Review of Systems  Constitutional: Negative for activity change, appetite change, diaphoresis, fatigue and fever.  Respiratory: Negative for cough, chest tightness and shortness of breath.   Cardiovascular: Negative  for chest pain, palpitations and leg swelling.  Gastrointestinal: Negative.   Musculoskeletal: Positive for arthralgias.  Neurological: Negative.   Psychiatric/Behavioral: Negative.     Per HPI unless specifically indicated above     Objective:    BP 128/80   Pulse 78   Temp 98.1 F (36.7 C) (Oral)   Wt 217 lb 3.2 oz (98.5 kg)   LMP  (LMP Unknown)   SpO2 96%   BMI 44.62 kg/m   Wt Readings from Last 3 Encounters:  06/22/20 217 lb 3.2 oz (98.5 kg)  03/14/20 222 lb (100.7 kg)  07/20/19 220 lb 3.2 oz (99.9 kg)    Physical Exam Vitals and nursing note reviewed.  Constitutional:      General: She is awake. She is not in acute distress.    Appearance: She is well-developed. She is obese. She is not ill-appearing.  HENT:     Head: Normocephalic.     Right Ear: Hearing normal.     Left Ear: Hearing normal.  Eyes:     General: Lids are normal.        Right eye: No discharge.        Left eye: No discharge.     Conjunctiva/sclera: Conjunctivae normal.     Pupils: Pupils are equal, round, and reactive to light.  Neck:     Thyroid: No thyromegaly.     Vascular: No carotid bruit.  Cardiovascular:  Rate and Rhythm: Normal rate and regular rhythm.     Pulses:          Popliteal pulses are 2+ on the right side and 2+ on the left side.       Dorsalis pedis pulses are 2+ on the right side and 2+ on the left side.       Posterior tibial pulses are 2+ on the right side and 2+ on the left side.     Heart sounds: Normal heart sounds. No murmur heard.  No gallop.   Pulmonary:     Effort: Pulmonary effort is normal. No accessory muscle usage or respiratory distress.     Breath sounds: Normal breath sounds.  Abdominal:     General: Bowel sounds are normal.     Palpations: Abdomen is soft.  Musculoskeletal:     Cervical back: Normal range of motion and neck supple.     Lumbar back: No swelling, edema, signs of trauma, spasms or tenderness. Normal range of motion. Positive right  straight leg raise test (mild discomfort). Negative left straight leg raise test.     Right lower leg: No swelling, deformity, tenderness or bony tenderness. No edema.     Left lower leg: No swelling, deformity, tenderness or bony tenderness. No edema.     Comments: Mild discomfort with rotation to left.  Lymphadenopathy:     Cervical: No cervical adenopathy.  Skin:    General: Skin is warm and dry.  Neurological:     Mental Status: She is alert and oriented to person, place, and time.  Psychiatric:        Attention and Perception: Attention normal.        Mood and Affect: Mood normal.        Speech: Speech normal.        Behavior: Behavior normal. Behavior is cooperative.        Thought Content: Thought content normal.     Results for orders placed or performed in visit on 04/25/20  Lipid Panel Piccolo, [employer]  Result Value Ref Range   Cholesterol Piccolo, Waived 189 <200 mg/dL   HDL Chol Piccolo, Waived 57 (L) >59 mg/dL   Triglycerides Piccolo,Waived 310 (H) <150 mg/dL   Chol/HDL Ratio Piccolo,Waive 3.3 mg/dL   LDL Chol Calc Piccolo Waived 70 <100 mg/dL   VLDL Chol Calc Piccolo,Waive 62 (H) <30 mg/dL  Comprehensive metabolic panel  Result Value Ref Range   Glucose 109 (H) 65 - 99 mg/dL   BUN 14 6 - 24 mg/dL   Creatinine, Ser 1.35 (H) 0.57 - 1.00 mg/dL   GFR calc non Af Amer 43 (L) >59 mL/min/1.73   GFR calc Af Amer 50 (L) >59 mL/min/1.73   BUN/Creatinine Ratio 10 9 - 23   Sodium 142 134 - 144 mmol/L   Potassium 4.3 3.5 - 5.2 mmol/L   Chloride 104 96 - 106 mmol/L   CO2 21 20 - 29 mmol/L   Calcium 9.7 8.7 - 10.2 mg/dL   Total Protein 7.2 6.0 - 8.5 g/dL   Albumin 4.2 3.8 - 4.9 g/dL   Globulin, Total 3.0 1.5 - 4.5 g/dL   Albumin/Globulin Ratio 1.4 1.2 - 2.2   Bilirubin Total <0.2 0.0 - 1.2 mg/dL   Alkaline Phosphatase 141 (H) 48 - 121 IU/L   AST 25 0 - 40 IU/L   ALT 34 (H) 0 - 32 IU/L      Assessment & Plan:   Problem List Items Addressed This Visit  Nervous  and Auditory   Low back pain with right-sided sciatica    Acute for a few weeks, had imaging with nephrology noting degenerative disc lumbar spine.  Will trial renal dosed Gabapentin, 300 MG at HS and recommend she continue Tylenol as needed.  May take 1000 MG TID as needed.  Avoid Ibuprofen.  Recommend she continue to hydrate well and stretch daily.  Discussed she would benefit from some modest weight loss, continue to work on diet and exercise.  Return to office for any worsening or ongoing symptoms.      Relevant Medications   gabapentin (NEURONTIN) 300 MG capsule       Follow up plan: Return if symptoms worsen or fail to improve.

## 2020-06-26 DIAGNOSIS — H35352 Cystoid macular degeneration, left eye: Secondary | ICD-10-CM | POA: Diagnosis not present

## 2020-07-14 ENCOUNTER — Other Ambulatory Visit: Payer: Self-pay | Admitting: Psychiatry

## 2020-07-21 ENCOUNTER — Ambulatory Visit: Payer: Medicare Other | Admitting: Nurse Practitioner

## 2020-07-31 DIAGNOSIS — H2513 Age-related nuclear cataract, bilateral: Secondary | ICD-10-CM | POA: Diagnosis not present

## 2020-07-31 DIAGNOSIS — H35351 Cystoid macular degeneration, right eye: Secondary | ICD-10-CM | POA: Diagnosis not present

## 2020-08-09 ENCOUNTER — Telehealth: Payer: Self-pay | Admitting: Nurse Practitioner

## 2020-08-09 NOTE — Telephone Encounter (Signed)
Forms were brought in to be signed and reviewed for pt to be able to be in the special [place]. Form placed in bin to be reviewed,

## 2020-08-09 NOTE — Telephone Encounter (Signed)
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 We last saw her for a follow up, routine visit on 03/14/20 and her next is scheduled for 09/13/20. Can this be filled out based on 03/14/20 visit?

## 2020-08-09 NOTE — Telephone Encounter (Signed)
We can do this based on that visit, since we see her every 6 months.

## 2020-08-10 ENCOUNTER — Other Ambulatory Visit: Payer: Self-pay | Admitting: Nurse Practitioner

## 2020-08-10 DIAGNOSIS — I10 Essential (primary) hypertension: Secondary | ICD-10-CM

## 2020-08-10 NOTE — Telephone Encounter (Signed)
Requested medication (s) are due for refill today:  Yes  Requested medication (s) are on the active medication list:  Yes  Future visit scheduled:  Yes  Last Refill: 06/12/20: #30; no refills  Notes to clinic- Magnesium level out of date  Requested Prescriptions  Pending Prescriptions Disp Refills   magnesium oxide (MAG-OX) 400 (241.3 Mg) MG tablet [Pharmacy Med Name: MAGNESIUM OXIDE 400 MG TABLET] 30 tablet 0    Sig: TAKE 1 TABLET BY MOUTH ONCE DAILY.      Endocrinology:  Minerals - Magnesium Supplementation Failed - 08/10/2020 10:28 AM      Failed - Mg Level in normal range and within 360 days    Magnesium  Date Value Ref Range Status  12/28/2013 1.7 (L) mg/dL Final    Comment:    1.8-2.4 THERAPEUTIC RANGE: 4-7 mg/dL TOXIC: > 10 mg/dL  -----------------------           Passed - Valid encounter within last 12 months    Recent Outpatient Visits           1 month ago Acute right-sided low back pain with right-sided sciatica   [facility] [place], Jolene T, NP   4 months ago Mixed hyperlipidemia   [place], Jolene T, NP   6 months ago Class 3 severe obesity due to excess calories with serious comorbidity and body mass index (BMI) of 40.0 to 44.9 in adult [facility])   [place], Henrine Screws T, NP   1 year ago Chronic kidney disease, stage 3 ([place])   [place], Barbaraann Faster, NP   1 year ago Essential hypertension   [place], Barbaraann Faster, NP       Future Appointments             In 1 month Cannady, Barbaraann Faster, NP [facility]   In 2 months  [facility]             Signed Prescriptions Disp Refills   lisinopril (ZESTRIL) 5 MG tablet 90 tablet 0    Sig: TAKE 1 TABLET BY MOUTH ONCE DAILY.      Cardiovascular:  ACE Inhibitors Failed - 08/10/2020 10:28 AM      Failed - Cr in normal range and within 180 days    Creatinine  Date Value Ref Range  Status  12/28/2013 1.36 (H) 0.60 - 1.30 mg/dL Final   Creatinine, Ser  Date Value Ref Range Status  04/25/2020 1.35 (H) 0.57 - 1.00 mg/dL Final          Passed - K in normal range and within 180 days    Potassium  Date Value Ref Range Status  04/25/2020 4.3 3.5 - 5.2 mmol/L Final  12/28/2013 3.8 3.5 - 5.1 mmol/L Final          Passed - Patient is not pregnant      Passed - Last BP in normal range    BP Readings from Last 1 Encounters:  06/22/20 128/80          Passed - Valid encounter within last 6 months    Recent Outpatient Visits           1 month ago Acute right-sided low back pain with right-sided sciatica   [facility] [place], Barbaraann Faster, NP   4 months ago Mixed hyperlipidemia   [place], [place] T, NP   6 months ago Class 3 severe  obesity due to excess calories with serious comorbidity and body mass index (BMI) of 40.0 to 44.9 in adult [facility])   [place], Henrine Screws T, NP   1 year ago Chronic kidney disease, stage 3 ([employer])   [place] Cannady, Barbaraann Faster, NP   1 year ago Essential hypertension   [place], Barbaraann Faster, NP       Future Appointments             In 1 month [place]ady, [place] T, NP [facility]   In 2 months  [facility]              omeprazole (PRILOSEC) 20 MG capsule 90 capsule 1    Sig: TAKE (1) CAPSULE BY MOUTH ONCE DAILY.      Gastroenterology: Proton Pump Inhibitors Passed - 08/10/2020 10:28 AM      Passed - Valid encounter within last 12 months    Recent Outpatient Visits           1 month ago Acute right-sided low back pain with right-sided sciatica   [name] [place], Jolene T, NP   4 months ago Mixed hyperlipidemia   [place], Jolene T, NP   6 months ago Class 3 severe obesity due to excess calories with serious comorbidity and body mass index (BMI) of 40.0 to 44.9 in  adult [facility])   [place], Henrine Screws T, NP   1 year ago Chronic kidney disease, stage 3 ([place])   [place] Cannady, Barbaraann Faster, NP   1 year ago Essential hypertension   [place], [place] T, NP       Future Appointments             In 1 month Cannady, [place] T, NP [facility]   In 2 months  [facility]             Refused Prescriptions Disp Refills   gabapentin (NEURONTIN) 300 MG capsule [Pharmacy Med Name: GABAPENTIN 300 MG CAPSULE] 20 capsule 0    Sig: TAKE 1 CAPSULE BY MOUTH AT BEDTIME      Neurology: Anticonvulsants - gabapentin Passed - 08/10/2020 10:28 AM      Passed - Valid encounter within last 12 months    Recent Outpatient Visits           1 month ago Acute right-sided low back pain with right-sided sciatica   [place], Jolene T, NP   4 months ago Mixed hyperlipidemia   [place], Jolene T, NP   6 months ago Class 3 severe obesity due to excess calories with serious comorbidity and body mass index (BMI) of 40.0 to 44.9 in adult [facility])   [place], Jolene T, NP   1 year ago Chronic kidney disease, stage 3 ([place])   [place] Cannady, Barbaraann Faster, NP   1 year ago Essential hypertension   [place], Barbaraann Faster, NP       Future Appointments             In 1 month Cannady, Barbaraann Faster, NP [facility]   In 2 months  [facility]

## 2020-08-10 NOTE — Telephone Encounter (Signed)
Routing to provider  

## 2020-08-10 NOTE — Telephone Encounter (Signed)
Form filled out and placed in providers folder for signature and completion.

## 2020-08-10 NOTE — Telephone Encounter (Signed)
[name] notified of patient's form ready for pick up.

## 2020-08-10 NOTE — Telephone Encounter (Signed)
Requested Prescriptions  Pending Prescriptions Disp Refills   lisinopril (ZESTRIL) 5 MG tablet [Pharmacy Med Name: LISINOPRIL 5 MG TABLET] 90 tablet 0    Sig: TAKE 1 TABLET BY MOUTH ONCE DAILY.     Cardiovascular:  ACE Inhibitors Failed - 08/10/2020 10:28 AM      Failed - Cr in normal range and within 180 days    Creatinine  Date Value Ref Range Status  12/28/2013 1.36 (H) 0.60 - 1.30 mg/dL Final   Creatinine, Ser  Date Value Ref Range Status  04/25/2020 1.35 (H) 0.57 - 1.00 mg/dL Final         Passed - K in normal range and within 180 days    Potassium  Date Value Ref Range Status  04/25/2020 4.3 3.5 - 5.2 mmol/L Final  12/28/2013 3.8 3.5 - 5.1 mmol/L Final         Passed - Patient is not pregnant      Passed - Last BP in normal range    BP Readings from Last 1 Encounters:  06/22/20 128/80         Passed - Valid encounter within last 6 months    Recent Outpatient Visits          1 month ago Acute right-sided low back pain with right-sided sciatica   [facility] [place], Jolene T, NP   4 months ago Mixed hyperlipidemia   [place], Jolene T, NP   6 months ago Class 3 severe obesity due to excess calories with serious comorbidity and body mass index (BMI) of 40.0 to 44.9 in adult [facility])   [place], Henrine Screws T, NP   1 year ago Chronic kidney disease, stage 3 ([place])   [place], Barbaraann Faster, NP   1 year ago Essential hypertension   [place], Barbaraann Faster, NP      Future Appointments            In 1 month [place], [place] T, NP [facility]   In 2 months  [facility]            magnesium oxide (MAG-OX) 400 (241.3 Mg) MG tablet [Pharmacy Med Name: MAGNESIUM OXIDE 400 MG TABLET] 30 tablet 0    Sig: TAKE 1 TABLET BY MOUTH ONCE DAILY.     Endocrinology:  Minerals - Magnesium Supplementation Failed - 08/10/2020 10:28 AM      Failed - Mg  Level in normal range and within 360 days    Magnesium  Date Value Ref Range Status  12/28/2013 1.7 (L) mg/dL Final    Comment:    1.8-2.4 THERAPEUTIC RANGE: 4-7 mg/dL TOXIC: > 10 mg/dL  -----------------------          Passed - Valid encounter within last 12 months    Recent Outpatient Visits          1 month ago Acute right-sided low back pain with right-sided sciatica   [facility] [place], Jolene T, NP   4 months ago Mixed hyperlipidemia   [place], Jolene T, NP   6 months ago Class 3 severe obesity due to excess calories with serious comorbidity and body mass index (BMI) of 40.0 to 44.9 in adult [facility])   [place] Cannady, Henrine Screws T, NP   1 year ago Chronic kidney disease, stage 3 ([place])   [place], Barbaraann Faster, NP   1 year ago Essential  hypertension   [facility] [place], Barbaraann Faster, NP      Future Appointments            In 74 month Cannady, Barbaraann Faster, NP [facility]   In 2 months  [facility]            omeprazole (PRILOSEC) 20 MG capsule [employer] Med Name: OMEPRAZOLE DR 20 MG CAPSULE] 90 capsule 1    Sig: TAKE (1) CAPSULE BY MOUTH ONCE DAILY.     Gastroenterology: Proton Pump Inhibitors Passed - 08/10/2020 10:28 AM      Passed - Valid encounter within last 12 months    Recent Outpatient Visits          1 month ago Acute right-sided low back pain with right-sided sciatica   [facility] [place], Jolene T, NP   4 months ago Mixed hyperlipidemia   [place], Jolene T, NP   6 months ago Class 3 severe obesity due to excess calories with serious comorbidity and body mass index (BMI) of 40.0 to 44.9 in adult [facility])   [place], Henrine Screws T, NP   1 year ago Chronic kidney disease, stage 3 ([place])   [place] Cannady, Barbaraann Faster, NP   1 year ago Essential hypertension   [place], [place] T, NP      Future Appointments            In 1 month Cannady, [place] T, NP [facility]   In 2 months  [facility]           Refused Prescriptions Disp Refills   gabapentin (NEURONTIN) 300 MG capsule [Pharmacy Med Name: GABAPENTIN 300 MG CAPSULE] 20 capsule 0    Sig: TAKE 1 CAPSULE BY MOUTH AT BEDTIME     Neurology: Anticonvulsants - gabapentin Passed - 08/10/2020 10:28 AM      Passed - Valid encounter within last 12 months    Recent Outpatient Visits          1 month ago Acute right-sided low back pain with right-sided sciatica   [place], Jolene T, NP   4 months ago Mixed hyperlipidemia   [place], Jolene T, NP   6 months ago Class 3 severe obesity due to excess calories with serious comorbidity and body mass index (BMI) of 40.0 to 44.9 in adult [facility])   [place], Jolene T, NP   1 year ago Chronic kidney disease, stage 3 ([place])   [place], Barbaraann Faster, NP   1 year ago Essential hypertension   [place], Barbaraann Faster, NP      Future Appointments            In 1 month Cannady, Barbaraann Faster, NP [facility]   In 2 months  [facility]

## 2020-09-04 ENCOUNTER — Other Ambulatory Visit: Payer: Self-pay | Admitting: Psychiatry

## 2020-09-05 ENCOUNTER — Other Ambulatory Visit: Payer: Self-pay | Admitting: Nurse Practitioner

## 2020-09-06 NOTE — Telephone Encounter (Signed)
Please Advise.  KP

## 2020-09-07 NOTE — Telephone Encounter (Signed)
Pts response.  KP

## 2020-09-08 ENCOUNTER — Other Ambulatory Visit: Payer: Self-pay | Admitting: Nurse Practitioner

## 2020-09-08 MED ORDER — ROSUVASTATIN CALCIUM 40 MG PO TABS: 40.0000 mg | ORAL_TABLET | Freq: Every day | ORAL | 4 refills | Status: DC

## 2020-09-10 ENCOUNTER — Encounter: Payer: Self-pay | Admitting: Nurse Practitioner

## 2020-09-13 ENCOUNTER — Ambulatory Visit (INDEPENDENT_AMBULATORY_CARE_PROVIDER_SITE_OTHER): Payer: Medicare Other | Admitting: Nurse Practitioner

## 2020-09-13 ENCOUNTER — Encounter: Payer: Self-pay | Admitting: Nurse Practitioner

## 2020-09-13 ENCOUNTER — Other Ambulatory Visit: Payer: Self-pay

## 2020-09-13 VITALS — BP 117/71 | HR 73 | Temp 98.7°F | Ht 59.0 in | Wt 223.6 lb

## 2020-09-13 DIAGNOSIS — N1831 Chronic kidney disease, stage 3a: Secondary | ICD-10-CM

## 2020-09-13 DIAGNOSIS — E559 Vitamin D deficiency, unspecified: Secondary | ICD-10-CM | POA: Diagnosis not present

## 2020-09-13 DIAGNOSIS — E782 Mixed hyperlipidemia: Secondary | ICD-10-CM

## 2020-09-13 DIAGNOSIS — F250 Schizoaffective disorder, bipolar type: Secondary | ICD-10-CM

## 2020-09-13 DIAGNOSIS — R7301 Impaired fasting glucose: Secondary | ICD-10-CM

## 2020-09-13 DIAGNOSIS — F819 Developmental disorder of scholastic skills, unspecified: Secondary | ICD-10-CM

## 2020-09-13 DIAGNOSIS — Z23 Encounter for immunization: Secondary | ICD-10-CM | POA: Diagnosis not present

## 2020-09-13 DIAGNOSIS — I10 Essential (primary) hypertension: Secondary | ICD-10-CM | POA: Diagnosis not present

## 2020-09-13 MED ORDER — LISINOPRIL 5 MG PO TABS: 5.0000 mg | ORAL_TABLET | Freq: Every day | ORAL | 4 refills | Status: DC

## 2020-09-13 MED ORDER — OMEPRAZOLE 20 MG PO CPDR
DELAYED_RELEASE_CAPSULE | ORAL | 4 refills | Status: DC
Start: 2020-09-13 — End: 2021-08-10

## 2020-09-13 NOTE — Assessment & Plan Note (Signed)
Ongoing, continue supplement and check levels today.

## 2020-09-13 NOTE — Assessment & Plan Note (Signed)
Recommend continued diet focus, cutting back on soda and drinking more water + weight loss.  A1C February was 5.5%.  No current medications.  Recheck A1C today.

## 2020-09-13 NOTE — Patient Instructions (Signed)
Preventing High Cholesterol Cholesterol is a white, waxy substance similar to fat that the human body needs to help build cells. The liver makes all the cholesterol that a person's body needs. Having high cholesterol (hypercholesterolemia) increases a person's risk for heart disease and stroke. Extra (excess) cholesterol comes from the food the person eats. High cholesterol can often be prevented with diet and lifestyle changes. If you already have high cholesterol, you can control it with diet and lifestyle changes and with medicine. How can high cholesterol affect me? If you have high cholesterol, deposits (plaques) may build up on the walls of your arteries. The arteries are the blood vessels that carry blood away from your heart. Plaques make the arteries narrower and stiffer. This can limit or block blood flow and cause blood clots to form. Blood clots:  Are tiny balls of cells that form in your blood.  Can move to the heart or brain, causing a heart attack or stroke. Plaques in arteries greatly increase your risk for heart attack and stroke.Making diet and lifestyle changes can reduce your risk for these conditions that may threaten your life. What can increase my risk? This condition is more likely to develop in people who:  Eat foods that are high in saturated fat or cholesterol. Saturated fat is mostly found in: ? Foods that contain animal fat, such as red meat and some dairy products. ? Certain fatty foods made from plants, such as tropical oils.  Are overweight.  Are not getting enough exercise.  Have a family history of high cholesterol. What actions can I take to prevent this? Nutrition   Eat less saturated fat.  Avoid trans fats (partially hydrogenated oils). These are often found in margarine and in some baked goods, fried foods, and snacks bought in packages.  Avoid precooked or cured meat, such as sausages or meat loaves.  Avoid foods and drinks that have added  sugars.  Eat more fruits, vegetables, and whole grains.  Choose healthy sources of protein, such as fish, poultry, lean cuts of red meat, beans, peas, lentils, and nuts.  Choose healthy sources of fat, such as: ? Nuts. ? Vegetable oils, especially olive oil. ? Fish that have healthy fats (omega-3 fatty acids), such as mackerel or salmon. The items listed above may not be a complete list of recommended foods and beverages. Contact a dietitian for more information. Lifestyle  Lose weight if you are overweight. Losing 5-10 lb (2.3-4.5 kg) can help prevent or control high cholesterol. It can also lower your risk for diabetes and high blood pressure. Ask your health care provider to help you with a diet and exercise plan to lose weight safely.  Do not use any products that contain nicotine or tobacco, such as cigarettes, e-cigarettes, and chewing tobacco. If you need help quitting, ask your health care provider.  Limit your alcohol intake. ? Do not drink alcohol if:  Your health care provider tells you not to drink.  You are pregnant, may be pregnant, or are planning to become pregnant. ? If you drink alcohol:  Limit how much you use to:  0-1 drink a day for women.  0-2 drinks a day for men.  Be aware of how much alcohol is in your drink. In the [place] one drink equals one 12 oz bottle of beer (355 mL), one 5 oz glass of wine (148 mL), or one 1 oz glass of hard liquor (44 mL). Activity   Get enough exercise. Each week, do at   least 150 minutes of exercise that takes a medium level of effort (moderate-intensity exercise). ? This is exercise that:  Makes your heart beat faster and makes you breathe harder than usual.  Allows you to still be able to talk. ? You could exercise in short sessions several times a day or longer sessions a few times a week. For example, on 5 days each week, you could walk fast or ride your bike 3 times a day for 10 minutes each time.  Do exercises as told  by your health care provider. Medicines  In addition to diet and lifestyle changes, your health care provider may recommend medicines to help lower cholesterol. This may be a medicine to lower the amount of cholesterol your liver makes. You may need medicine if: ? Diet and lifestyle changes do not lower your cholesterol enough. ? You have high cholesterol and other risk factors for heart disease or stroke.  Take over-the-counter and prescription medicines only as told by your health care provider. General information  Manage your risk factors for high cholesterol. Talk with your health care provider about all your risk factors and how to lower your risk.  Manage other conditions that you have, such as diabetes or high blood pressure (hypertension).  Have blood tests to check your cholesterol levels at regular points in time as told by your health care provider.  Keep all follow-up visits as told by your health care provider. This is important. Where to find more information  American Heart Association: www.heart.org  National Heart, Lung, and Blood Institute: www.nhlbi.nih.gov Summary  High cholesterol increases your risk for heart disease and stroke. By keeping your cholesterol level low, you can reduce your risk for these conditions.  High cholesterol can often be prevented with diet and lifestyle changes.  Work with your health care provider to manage your risk factors, and have your blood tested regularly. This information is not intended to replace advice given to you by your health care provider. Make sure you discuss any questions you have with your health care provider. Document Revised: 03/12/2019 Document Reviewed: 07/27/2016 Elsevier Patient Education  2020 Elsevier Inc.  

## 2020-09-13 NOTE — Assessment & Plan Note (Signed)
Baseline, continue to monitor.

## 2020-09-13 NOTE — Assessment & Plan Note (Signed)
Chronic, stable, followed by psychiatry.  Continue current medication regimen as prescribed by them.  Denies SI/HI.

## 2020-09-13 NOTE — Assessment & Plan Note (Signed)
Chronic, stable.  Continue collaboration with nephrology and review recent labs once available.  Will obtain CMP today.

## 2020-09-13 NOTE — Assessment & Plan Note (Signed)
BMI 45.16 with HTN, CKD.  Recommended eating smaller high protein, low fat meals more frequently and exercising 30 mins a day 5 times a week with a goal of 10-15lb weight loss in the next 3 months. Patient voiced their understanding and motivation to adhere to these recommendations.

## 2020-09-13 NOTE — Progress Notes (Signed)
BP 117/71 (BP Location: Left Arm, Patient Position: Sitting, Cuff Size: Normal)    Pulse 73    Temp 98.7 F (37.1 C) (Oral)    Ht 4\' 11"  (1.499 m)    Wt 223 lb 9.6 oz (101.4 kg)    LMP  (LMP Unknown)    SpO2 97%    BMI 45.16 kg/m    Subjective:    Patient ID: Rita [name], female    DOB: 1961-04-15, 59 y.o.   MRN: [id number]  HPI: Rita [name] is a 59 y.o. female  Chief Complaint  Patient presents with   Hypertension   Hyperlipidemia   Depression   Chronic Kidney Disease   HYPERTENSION / HYPERLIPIDEMIA WITH CKD Last saw nephrology, Dr. Holley Raring, on 06/06/20, labs in July 2021 GFR 55, CRT 1.24.  Continues on Crestor -- recently changed to this as leg cramps with higher dose Atorvastatin -- and Lisinopril and Carvedilol + ASA. Satisfied with current treatment? yes Duration of hypertension: chronic BP monitoring frequency: not checking BP range:  BP medication side effects: no Duration of hyperlipidemia: chronic Cholesterol medication side effects: no Cholesterol supplements: none Medication compliance: good compliance Aspirin: yes Recent stressors: no Recurrent headaches: no Visual changes: no Palpitations: no Dyspnea: no Chest pain: no Lower extremity edema: no Dizzy/lightheaded: no   IFG: No current medications, diet focused.  Recent A1C 5.5% February 2021.  Taking Vitamin D daily for deficiency -- last level 31.  Has been drinking more water and less sodas. Hypoglycemic episodes:no Polydipsia/polyuria: no Visual disturbance: no Chest pain: no Paresthesias: no   SCHIZOAFFECTIVE DISORDER Followed by psychiatry and last seen by Dr. Weber Cooks on 04/06/20. Mood status: stable Satisfied with current treatment?: yes Symptom severity: mild  Duration of current treatment : chronic Side effects: no Medication compliance: good compliance Psychotherapy/counseling: none Depressed mood: no Anxious mood: no Anhedonia: no Significant weight loss or gain: no Insomnia:  none Fatigue: no Feelings of worthlessness or guilt: no Impaired concentration/indecisiveness: no Suicidal ideations: no Hopelessness: no Crying spells: no Depression screen [facility] 2/9 09/13/2020 10/20/2019 07/14/2018 07/02/2018 01/09/2018  Decreased Interest 0 1 0 0 0  Down, Depressed, Hopeless 0 1 1 2 1   PHQ - 2 Score 0 2 1 2 1   Altered sleeping 0 1 1 0 0  Tired, decreased energy 0 1 2 1  0  Change in appetite 0 0 1 0 1  Feeling bad or failure about yourself  0 1 0 1 0  Trouble concentrating 0 0 1 2 0  Moving slowly or fidgety/restless 0 0 0 0 1  Suicidal thoughts 0 0 2 0 1  PHQ-9 Score 0 5 8 6 4   Difficult doing work/chores - Not difficult at all - Not difficult at all -    Relevant past medical, surgical, family and social history reviewed and updated as indicated. Interim medical history since our last visit reviewed. Allergies and medications reviewed and updated.  Review of Systems  Constitutional: Negative for activity change, appetite change, diaphoresis, fatigue and fever.  Respiratory: Negative for cough, chest tightness and shortness of breath.   Cardiovascular: Negative for chest pain, palpitations and leg swelling.  Gastrointestinal: Negative.   Endocrine: Negative for polydipsia, polyphagia and polyuria.  Neurological: Negative.   Psychiatric/Behavioral: Negative.     Per HPI unless specifically indicated above     Objective:    BP 117/71 (BP Location: Left Arm, Patient Position: Sitting, Cuff Size: Normal)    Pulse 73    Temp 98.7  F (37.1 C) (Oral)    Ht 4\' 11"  (1.499 m)    Wt 223 lb 9.6 oz (101.4 kg)    LMP  (LMP Unknown)    SpO2 97%    BMI 45.16 kg/m   Wt Readings from Last 3 Encounters:  09/13/20 223 lb 9.6 oz (101.4 kg)  06/22/20 217 lb 3.2 oz (98.5 kg)  03/14/20 222 lb (100.7 kg)    Physical Exam Vitals and nursing note reviewed.  Constitutional:      General: She is awake. She is not in acute distress.    Appearance: She is well-developed and  well-groomed. She is morbidly obese. She is not ill-appearing.  HENT:     Head: Normocephalic.     Right Ear: Hearing normal.     Left Ear: Hearing normal.  Eyes:     General: Lids are normal.        Right eye: No discharge.        Left eye: No discharge.     Conjunctiva/sclera: Conjunctivae normal.     Pupils: Pupils are equal, round, and reactive to light.  Neck:     Thyroid: No thyromegaly.     Vascular: No carotid bruit.  Cardiovascular:     Rate and Rhythm: Normal rate and regular rhythm.     Heart sounds: Normal heart sounds. No murmur heard.  No gallop.   Pulmonary:     Effort: Pulmonary effort is normal. No accessory muscle usage or respiratory distress.     Breath sounds: Normal breath sounds.  Abdominal:     General: Bowel sounds are normal.     Palpations: Abdomen is soft.  Musculoskeletal:     Cervical back: Normal range of motion and neck supple.     Right lower leg: No edema.     Left lower leg: No edema.  Skin:    General: Skin is warm and dry.  Neurological:     Mental Status: She is alert and oriented to person, place, and time.  Psychiatric:        Attention and Perception: Attention normal.        Mood and Affect: Mood normal.        Speech: Speech normal.        Behavior: Behavior normal. Behavior is cooperative.        Thought Content: Thought content normal.     Results for orders placed or performed in visit on 04/25/20  Lipid Panel Piccolo, [employer]  Result Value Ref Range   Cholesterol Piccolo, Waived 189 <200 mg/dL   HDL Chol Piccolo, Waived 57 (L) >59 mg/dL   Triglycerides Piccolo,Waived 310 (H) <150 mg/dL   Chol/HDL Ratio Piccolo,Waive 3.3 mg/dL   LDL Chol Calc Piccolo Waived 70 <100 mg/dL   VLDL Chol Calc Piccolo,Waive 62 (H) <30 mg/dL  Comprehensive metabolic panel  Result Value Ref Range   Glucose 109 (H) 65 - 99 mg/dL   BUN 14 6 - 24 mg/dL   Creatinine, Ser 1.35 (H) 0.57 - 1.00 mg/dL   GFR calc non Af Amer 43 (L) >59 mL/min/1.73    GFR calc Af Amer 50 (L) >59 mL/min/1.73   BUN/Creatinine Ratio 10 9 - 23   Sodium 142 134 - 144 mmol/L   Potassium 4.3 3.5 - 5.2 mmol/L   Chloride 104 96 - 106 mmol/L   CO2 21 20 - 29 mmol/L   Calcium 9.7 8.7 - 10.2 mg/dL   Total Protein 7.2 6.0 - 8.5 g/dL  Albumin 4.2 3.8 - 4.9 g/dL   Globulin, Total 3.0 1.5 - 4.5 g/dL   Albumin/Globulin Ratio 1.4 1.2 - 2.2   Bilirubin Total <0.2 0.0 - 1.2 mg/dL   Alkaline Phosphatase 141 (H) 48 - 121 IU/L   AST 25 0 - 40 IU/L   ALT 34 (H) 0 - 32 IU/L      Assessment & Plan:   Problem List Items Addressed This Visit      Cardiovascular and Mediastinum   Essential hypertension    Chronic, stable with BP at goal today.  Continue current medication regimen and collaboration with nephrology.  Lisinopril for kidney protection.  Recommend checking BP at home on occasion and documenting.  Return in 6 months.      Relevant Orders   Comprehensive metabolic panel   TSH     Endocrine   IFG (impaired fasting glucose)    Recommend continued diet focus, cutting back on soda and drinking more water + weight loss.  A1C February was 5.5%.  No current medications.  Recheck A1C today.      Relevant Orders   HgB A1c     Genitourinary   Chronic kidney disease, stage 3 (HCC)    Chronic, stable.  Continue collaboration with nephrology and review recent labs once available.  Will obtain CMP today.        Other   Learning disability    Baseline, continue to monitor.      Hyperlipidemia    Chronic, ongoing.  Continue current medication regimen and adjust as needed.  Lipid panel and LFT's today.        Relevant Orders   Comprehensive metabolic panel   Lipid Panel w/o Chol/HDL Ratio   Schizoaffective disorder, bipolar type ([place]) - Primary    Chronic, stable, followed by psychiatry.  Continue current medication regimen as prescribed by them.  Denies SI/HI.      Morbid obesity (HCC)    BMI 45.16 with HTN, CKD.  Recommended eating smaller high  protein, low fat meals more frequently and exercising 30 mins a day 5 times a week with a goal of 10-15lb weight loss in the next 3 months. Patient voiced their understanding and motivation to adhere to these recommendations.       Vitamin D deficiency    Ongoing, continue supplement and check levels today.      Relevant Orders   VITAMIN D 25 Hydroxy (Vit-D Deficiency, Fractures)    Other Visit Diagnoses    Need for influenza vaccination       Flu vaccine today   Relevant Orders   Flu Vaccine QUAD 36+ mos IM (Completed)       Follow up plan: Return in about 6 months (around 03/14/2021) for HLD, MOOD, HTN, CKD.

## 2020-09-13 NOTE — Assessment & Plan Note (Signed)
Chronic, ongoing.  Continue current medication regimen and adjust as needed.  Lipid panel and LFT's today.

## 2020-09-13 NOTE — Addendum Note (Signed)
Addended by: Marnee Guarneri T on: 09/13/2020 10:23 AM   Modules accepted: Orders

## 2020-09-13 NOTE — Assessment & Plan Note (Signed)
Chronic, stable with BP at goal today.  Continue current medication regimen and collaboration with nephrology.  Lisinopril for kidney protection.  Recommend checking BP at home on occasion and documenting.  Return in 6 months.

## 2020-09-14 LAB — COMPREHENSIVE METABOLIC PANEL
ALT: 22 IU/L (ref 0–32)
AST: 17 IU/L (ref 0–40)
Albumin/Globulin Ratio: 1.3 (ref 1.2–2.2)
Albumin: 3.9 g/dL (ref 3.8–4.9)
Alkaline Phosphatase: 126 IU/L — ABNORMAL HIGH (ref 44–121)
BUN/Creatinine Ratio: 9 (ref 9–23)
BUN: 14 mg/dL (ref 6–24)
Bilirubin Total: <0.2 mg/dL (ref 0.0–1.2)
CO2: 24 mmol/L (ref 20–29)
Calcium: 9.4 mg/dL (ref 8.7–10.2)
Chloride: 102 mmol/L (ref 96–106)
Creatinine, Ser: 1.52 mg/dL — ABNORMAL HIGH (ref 0.57–1.00)
GFR calc Af Amer: 43 mL/min/{1.73_m2} — ABNORMAL LOW (ref >59)
GFR calc non Af Amer: 38 mL/min/{1.73_m2} — ABNORMAL LOW (ref >59)
Globulin, Total: 3 g/dL (ref 1.5–4.5)
Glucose: 86 mg/dL (ref 65–99)
Potassium: 4.7 mmol/L (ref 3.5–5.2)
Sodium: 139 mmol/L (ref 134–144)
Total Protein: 6.9 g/dL (ref 6.0–8.5)

## 2020-09-14 LAB — LIPID PANEL W/O CHOL/HDL RATIO
Cholesterol, Total: 183 mg/dL (ref 100–199)
HDL: 41 mg/dL (ref >39)
LDL Chol Calc (NIH): 89 mg/dL (ref 0–99)
Triglycerides: 322 mg/dL — ABNORMAL HIGH (ref 0–149)
VLDL Cholesterol Cal: 53 mg/dL — ABNORMAL HIGH (ref 5–40)

## 2020-09-14 LAB — HEMOGLOBIN A1C
Est. average glucose Bld gHb Est-mCnc: 120 mg/dL
Hgb A1c MFr Bld: 5.8 % — ABNORMAL HIGH (ref 4.8–5.6)

## 2020-09-14 LAB — VITAMIN D 25 HYDROXY (VIT D DEFICIENCY, FRACTURES): Vit D, 25-Hydroxy: 24.1 ng/mL — ABNORMAL LOW (ref 30.0–100.0)

## 2020-09-14 LAB — TSH: TSH: 1.4 u[IU]/mL (ref 0.450–4.500)

## 2020-09-14 NOTE — Progress Notes (Signed)
Contacted via [place] morning Rita [name], your labs have returned: - You continue to show baseline kidney disease.  Continue to follow with kidney doctor and increase your water intake. - Liver function tests, AST/ALT, are normal. - Thyroid level is normal - The good news is cholesterol levels are better this check with LDL 89 -- I highly recommend continuing the Rosuvastatin daily. - Vitamin D level is on lower side, I recommend increasing daily dose of this to 2000 units daily for bone health.  Any questions? Keep being awesome!!  Thank you for allowing me to participate in your care. Kindest regards, Demarion Pondexter

## 2020-09-15 ENCOUNTER — Other Ambulatory Visit: Payer: Self-pay | Admitting: Nurse Practitioner

## 2020-09-15 MED ORDER — VITAMIN D3 25 MCG (1000 UNIT) PO TABS
2000.0000 [IU] | ORAL_TABLET | Freq: Every day | ORAL | 12 refills | Status: DC
Start: 2020-09-15 — End: 2020-09-19

## 2020-09-19 ENCOUNTER — Other Ambulatory Visit: Payer: Self-pay | Admitting: Nurse Practitioner

## 2020-09-19 ENCOUNTER — Other Ambulatory Visit: Payer: Self-pay

## 2020-09-19 ENCOUNTER — Telehealth (INDEPENDENT_AMBULATORY_CARE_PROVIDER_SITE_OTHER): Payer: Medicare Other | Admitting: Psychiatry

## 2020-09-19 ENCOUNTER — Encounter: Payer: Self-pay | Admitting: Psychiatry

## 2020-09-19 DIAGNOSIS — F819 Developmental disorder of scholastic skills, unspecified: Secondary | ICD-10-CM | POA: Diagnosis not present

## 2020-09-19 DIAGNOSIS — F259 Schizoaffective disorder, unspecified: Secondary | ICD-10-CM | POA: Diagnosis not present

## 2020-09-19 DIAGNOSIS — F411 Generalized anxiety disorder: Secondary | ICD-10-CM | POA: Diagnosis not present

## 2020-09-19 MED ORDER — VITAMIN D3 25 MCG (1000 UNIT) PO TABS
2000.0000 [IU] | ORAL_TABLET | Freq: Every day | ORAL | 12 refills | Status: DC
Start: 2020-09-19 — End: 2021-08-10

## 2020-09-19 MED ORDER — DULOXETINE HCL 60 MG PO CPEP: 60.0000 mg | ORAL_CAPSULE | Freq: Every day | ORAL | 5 refills | Status: DC

## 2020-09-19 MED ORDER — TRAZODONE HCL 150 MG PO TABS
150.0000 mg | ORAL_TABLET | Freq: Every day | ORAL | 5 refills | Status: DC
Start: 2020-09-19 — End: 2021-01-09

## 2020-09-19 MED ORDER — CLONAZEPAM 0.5 MG PO TABS
0.5000 mg | ORAL_TABLET | Freq: Two times a day (BID) | ORAL | 5 refills | Status: DC
Start: 2020-09-19 — End: 2021-02-15

## 2020-09-19 MED ORDER — DULOXETINE HCL 30 MG PO CPEP: 30.0000 mg | ORAL_CAPSULE | Freq: Every day | ORAL | 5 refills | Status: DC

## 2020-09-19 MED ORDER — QUETIAPINE FUMARATE 100 MG PO TABS: 100.0000 mg | ORAL_TABLET | Freq: Every day | ORAL | 5 refills | Status: DC

## 2020-09-19 NOTE — Progress Notes (Signed)
Follow-up note for this 59 year old woman with intellectual disability history of schizoaffective disorder.  Patient was contacted by telephone.  She was at her group home living situation and I was here at the hospital.  Identity is established.  Total time on the telephone a little over 10 minutes.  Spoke with the patient and also the manager of the group home who acts as her day-to-day de facto caregiver.  Both the caregiver and the patient report no new symptoms.  She has been doing quite well.  No behavior problems.  Enjoys spending time and recreation both on her own and with family and friends.  She still has trouble sleeping waking up frequently at night but then feels drowsy later on.  Patient has requested a decrease in the dose of trazodone.  Patient appeared alert and oriented.  Affect appeared euthymic appropriate.  Denies hallucinations.  Denies suicidal thoughts.  Appears to understand the use of medication.  We reviewed the current medication plan.  I agreed to try decreasing trazodone to 150 mg with the understanding that he could call me back if this was not working out.  Everything else refilled.  Follow-up 6 months.

## 2020-10-12 DIAGNOSIS — I10 Essential (primary) hypertension: Secondary | ICD-10-CM | POA: Diagnosis not present

## 2020-10-12 DIAGNOSIS — N2581 Secondary hyperparathyroidism of renal origin: Secondary | ICD-10-CM | POA: Diagnosis not present

## 2020-10-12 DIAGNOSIS — N1832 Chronic kidney disease, stage 3b: Secondary | ICD-10-CM | POA: Diagnosis not present

## 2020-10-17 ENCOUNTER — Encounter: Payer: Self-pay | Admitting: Psychiatry

## 2020-10-17 ENCOUNTER — Other Ambulatory Visit: Payer: Self-pay

## 2020-10-17 ENCOUNTER — Ambulatory Visit (INDEPENDENT_AMBULATORY_CARE_PROVIDER_SITE_OTHER): Payer: Medicare Other | Admitting: Psychiatry

## 2020-10-17 DIAGNOSIS — F259 Schizoaffective disorder, unspecified: Secondary | ICD-10-CM | POA: Diagnosis not present

## 2020-10-17 DIAGNOSIS — F819 Developmental disorder of scholastic skills, unspecified: Secondary | ICD-10-CM

## 2020-10-17 DIAGNOSIS — R519 Headache, unspecified: Secondary | ICD-10-CM | POA: Diagnosis not present

## 2020-10-17 NOTE — Progress Notes (Signed)
Virtual Visit via Telephone Note  I connected with Rita [name] on 10/17/20 at  1:40 PM EST by telephone and verified that I am speaking with the correct person using two identifiers.  Location: Patient: Home.  Group home. Provider: Hospital   I discussed the limitations, risks, security and privacy concerns of performing an evaluation and management service by telephone and the availability of in person appointments. I also discussed with the patient that there may be a patient responsible charge related to this service. The patient expressed understanding and agreed to proceed.   History of Present Illness: Patient sleep seems to be adequate.  Tolerated the slight decrease in trazodone without any difficulty.  The caregiver notes that she is doing well with it does not appear to be oversedated.  No new complaints no behavior problems    Observations/Objective: Spoke primarily with caregiver.  Patient herself evidently no complaints last interaction with her she was at her baseline   Assessment and Plan: No change to medication management  Follow Up Instructions:    I discussed the assessment and treatment plan with the patient. The patient was provided an opportunity to ask questions and all were answered. The patient agreed with the plan and demonstrated an understanding of the instructions.   The patient was advised to call back or seek an in-person evaluation if the symptoms worsen or if the condition fails to improve as anticipated.  I provided 10 minutes of non-face-to-face time during this encounter.   Alethia Berthold, MD

## 2020-10-23 ENCOUNTER — Ambulatory Visit (INDEPENDENT_AMBULATORY_CARE_PROVIDER_SITE_OTHER): Payer: Medicare Other

## 2020-10-23 VITALS — Ht 59.0 in | Wt 224.0 lb

## 2020-10-23 DIAGNOSIS — Z0000 Encounter for general adult medical examination without abnormal findings: Secondary | ICD-10-CM | POA: Diagnosis not present

## 2020-10-23 NOTE — Patient Instructions (Signed)
Rita [name] , Thank you for taking time to come for your Medicare Wellness Visit. I appreciate your ongoing commitment to your health goals. Please review the following plan we discussed and let me know if I can assist you in the future.   Screening recommendations/referrals: Colonoscopy: completed 06/27/2017, due 06/27/2022 Mammogram: completed 05/31/2020, due 05/31/2021 Bone Density: n/a Recommended yearly ophthalmology/optometry visit for glaucoma screening and checkup Recommended yearly dental visit for hygiene and checkup  Vaccinations: Influenza vaccine: completed 09/13/2020, due 07/02/2021 Pneumococcal vaccine: n/a Tdap vaccine: due Shingles vaccine:  discussed  Covid-19:  01/14/2020, 12/17/2019  Advanced directives: Please bring a copy of your POA (Power of Attorney) and/or Living Will to your next appointment.   Conditions/risks identified: none  Next appointment: Follow up in one year for your annual wellness visit.   Preventive Care 40-64 Years, Female Preventive care refers to lifestyle choices and visits with your health care provider that can promote health and wellness. What does preventive care include?  A yearly physical exam. This is also called an annual well check.  Dental exams once or twice a year.  Routine eye exams. Ask your health care provider how often you should have your eyes checked.  Personal lifestyle choices, including:  Daily care of your teeth and gums.  Regular physical activity.  Eating a healthy diet.  Avoiding tobacco and drug use.  Limiting alcohol use.  Practicing safe sex.  Taking low-dose aspirin daily starting at age 72.  Taking vitamin and mineral supplements as recommended by your health care provider. What happens during an annual well check? The services and screenings done by your health care provider during your annual well check will depend on your age, overall health, lifestyle risk factors, and family history of  disease. Counseling  Your health care provider may ask you questions about your:  Alcohol use.  Tobacco use.  Drug use.  Emotional well-being.  Home and relationship well-being.  Sexual activity.  Eating habits.  Work and work Statistician.  Method of birth control.  Menstrual cycle.  Pregnancy history. Screening  You may have the following tests or measurements:  Height, weight, and BMI.  Blood pressure.  Lipid and cholesterol levels. These may be checked every 5 years, or more frequently if you are over 34 years old.  Skin check.  Lung cancer screening. You may have this screening every year starting at age 43 if you have a 30-pack-year history of smoking and currently smoke or have quit within the past 15 years.  Fecal occult blood test (FOBT) of the stool. You may have this test every year starting at age 66.  Flexible sigmoidoscopy or colonoscopy. You may have a sigmoidoscopy every 5 years or a colonoscopy every 10 years starting at age 11.  Hepatitis C blood test.  Hepatitis B blood test.  Sexually transmitted disease (STD) testing.  Diabetes screening. This is done by checking your blood sugar (glucose) after you have not eaten for a while (fasting). You may have this done every 1-3 years.  Mammogram. This may be done every 1-2 years. Talk to your health care provider about when you should start having regular mammograms. This may depend on whether you have a family history of breast cancer.  BRCA-related cancer screening. This may be done if you have a family history of breast, ovarian, tubal, or peritoneal cancers.  Pelvic exam and Pap test. This may be done every 3 years starting at age 27. Starting at age 15, this may be  done every 5 years if you have a Pap test in combination with an HPV test.  Bone density scan. This is done to screen for osteoporosis. You may have this scan if you are at high risk for osteoporosis. Discuss your test results,  treatment options, and if necessary, the need for more tests with your health care provider. Vaccines  Your health care provider may recommend certain vaccines, such as:  Influenza vaccine. This is recommended every year.  Tetanus, diphtheria, and acellular pertussis (Tdap, Td) vaccine. You may need a Td booster every 10 years.  Zoster vaccine. You may need this after age 40.  Pneumococcal 13-valent conjugate (PCV13) vaccine. You may need this if you have certain conditions and were not previously vaccinated.  Pneumococcal polysaccharide (PPSV23) vaccine. You may need one or two doses if you smoke cigarettes or if you have certain conditions. Talk to your health care provider about which screenings and vaccines you need and how often you need them. This information is not intended to replace advice given to you by your health care provider. Make sure you discuss any questions you have with your health care provider. Document Released: 12/15/2015 Document Revised: 08/07/2016 Document Reviewed: 09/19/2015 Elsevier Interactive Patient Education  2017 [place] Prevention in the Home Falls can cause injuries. They can happen to people of all ages. There are many things you can do to make your home safe and to help prevent falls. What can I do on the outside of my home?  Regularly fix the edges of walkways and driveways and fix any cracks.  Remove anything that might make you trip as you walk through a door, such as a raised step or threshold.  Trim any bushes or trees on the path to your home.  Use bright outdoor lighting.  Clear any walking paths of anything that might make someone trip, such as rocks or tools.  Regularly check to see if handrails are loose or broken. Make sure that both sides of any steps have handrails.  Any raised decks and porches should have guardrails on the edges.  Have any leaves, snow, or ice cleared regularly.  Use sand or salt on walking  paths during winter.  Clean up any spills in your garage right away. This includes oil or grease spills. What can I do in the bathroom?  Use night lights.  Install grab bars by the toilet and in the tub and shower. Do not use towel bars as grab bars.  Use non-skid mats or decals in the tub or shower.  If you need to sit down in the shower, use a plastic, non-slip stool.  Keep the floor dry. Clean up any water that spills on the floor as soon as it happens.  Remove soap buildup in the tub or shower regularly.  Attach bath mats securely with double-sided non-slip rug tape.  Do not have throw rugs and other things on the floor that can make you trip. What can I do in the bedroom?  Use night lights.  Make sure that you have a light by your bed that is easy to reach.  Do not use any sheets or blankets that are too big for your bed. They should not hang down onto the floor.  Have a firm chair that has side arms. You can use this for support while you get dressed.  Do not have throw rugs and other things on the floor that can make you trip. What  can I do in the kitchen?  Clean up any spills right away.  Avoid walking on wet floors.  Keep items that you use a lot in easy-to-reach places.  If you need to reach something above you, use a strong step stool that has a grab bar.  Keep electrical cords out of the way.  Do not use floor polish or wax that makes floors slippery. If you must use wax, use non-skid floor wax.  Do not have throw rugs and other things on the floor that can make you trip. What can I do with my stairs?  Do not leave any items on the stairs.  Make sure that there are handrails on both sides of the stairs and use them. Fix handrails that are broken or loose. Make sure that handrails are as long as the stairways.  Check any carpeting to make sure that it is firmly attached to the stairs. Fix any carpet that is loose or worn.  Avoid having throw rugs at  the top or bottom of the stairs. If you do have throw rugs, attach them to the floor with carpet tape.  Make sure that you have a light switch at the top of the stairs and the bottom of the stairs. If you do not have them, ask someone to add them for you. What else can I do to help prevent falls?  Wear shoes that:  Do not have high heels.  Have rubber bottoms.  Are comfortable and fit you well.  Are closed at the toe. Do not wear sandals.  If you use a stepladder:  Make sure that it is fully opened. Do not climb a closed stepladder.  Make sure that both sides of the stepladder are locked into place.  Ask someone to hold it for you, if possible.  Clearly mark and make sure that you can see:  Any grab bars or handrails.  First and last steps.  Where the edge of each step is.  Use tools that help you move around (mobility aids) if they are needed. These include:  Canes.  Walkers.  Scooters.  Crutches.  Turn on the lights when you go into a dark area. Replace any light bulbs as soon as they burn out.  Set up your furniture so you have a clear path. Avoid moving your furniture around.  If any of your floors are uneven, fix them.  If there are any pets around you, be aware of where they are.  Review your medicines with your doctor. Some medicines can make you feel dizzy. This can increase your chance of falling. Ask your doctor what other things that you can do to help prevent falls. This information is not intended to replace advice given to you by your health care provider. Make sure you discuss any questions you have with your health care provider. Document Released: 09/14/2009 Document Revised: 04/25/2016 Document Reviewed: 12/23/2014 Elsevier Interactive Patient Education  2017 [employer].

## 2020-10-23 NOTE — Progress Notes (Signed)
I connected with  [name] today via telehealth video enabled device and verified that I am speaking with the correct person using two identifiers.   Location: Patient: home Provider:home   Persons participating in virtual visit: Pilar Grammes, caregiver Kathreen Devoid, Glenna Durand LPN  I discussed the limitations, risks, security and privacy concerns of performing an evaluation and management service by video and the availability of in person appointments. The patient expressed understanding and agreed to proceed.   Some vital signs may be absent or patient reported.      Subjective:   KANAE [name] is a 59 y.o. female who presents for Medicare Annual (Subsequent) preventive examination.  Review of Systems     Cardiac Risk Factors include: hypertension;obesity (BMI >30kg/m2);sedentary lifestyle     Objective:    Today's Vitals   10/23/20 0945  Weight: 224 lb (101.6 kg)  Height: 4\' 11"  (1.499 m)   Body mass index is 45.24 kg/m.  Advanced Directives 10/23/2020 10/20/2019 07/02/2018 07/02/2017 07/15/2016 11/15/2015 11/07/2015  Does Patient Have a Medical Advance Directive? Yes Yes Yes Yes Yes Yes Yes  Type of Paramedic of [place];Living will Living will;Healthcare Power of [place];Living will [place];Living will [place];Living will [place];Living will [place];Living will  Does patient want to make changes to medical advance directive? - - - - - No - Patient declined -  Copy of [place] Antonia in Chart? No - copy requested Yes - validated most recent copy scanned in chart (See row information) Yes Yes - No - copy requested -  Would patient like information on creating a medical advance directive? - - - - - - -  Some encounter information is confidential and restricted. Go to Review Flowsheets activity to see all data.    Current  Medications (verified) Outpatient Encounter Medications as of 10/23/2020  Medication Sig  . acetaminophen (TYLENOL) 500 MG tablet Take 2 tablets (1,000 mg total) by mouth every 6 (six) hours as needed.  Marland Kitchen aspirin 81 MG chewable tablet TAKE 1 TABLET BY MOUTH ONCE DAILY.  . carvedilol (COREG) 6.25 MG tablet Take 6.25 mg by mouth 2 (two) times daily.  . cholecalciferol (VITAMIN D) 25 MCG (1000 UNIT) tablet Take 2 tablets (2,000 Units total) by mouth daily.  . clonazePAM (KLONOPIN) 0.5 MG tablet Take 1 tablet (0.5 mg total) by mouth 2 (two) times daily.  . clonazePAM (KLONOPIN) 0.5 MG tablet Take 1 tablet (0.5 mg total) by mouth 2 (two) times daily.  Marland Kitchen dextromethorphan (DELSYM) 30 MG/5ML liquid Take 2.5 mLs (15 mg total) by mouth as needed for cough.  . docusate sodium (COLACE) 100 MG capsule Take 1 capsule (100 mg total) by mouth 2 (two) times daily as needed for mild constipation.  . DULoxetine (CYMBALTA) 30 MG capsule Take 1 capsule (30 mg total) by mouth at bedtime.  . DULoxetine (CYMBALTA) 60 MG capsule TAKE (1) CAPSULE BY MOUTH ONCE DAILY.  . DULoxetine (CYMBALTA) 60 MG capsule Take 1 capsule (60 mg total) by mouth daily.  Marland Kitchen gabapentin (NEURONTIN) 300 MG capsule Take 1 capsule (300 mg total) by mouth at bedtime.  Marland Kitchen lisinopril (ZESTRIL) 5 MG tablet Take 1 tablet (5 mg total) by mouth daily.  . magnesium oxide (MAG-OX) 400 (241.3 Mg) MG tablet TAKE 1 TABLET BY MOUTH ONCE DAILY.  . Multiple Vitamins-Minerals (TAB-A-VITE) TABS TAKE 1 TABLET BY MOUTH ONCE DAILY.  Marland Kitchen  omeprazole (PRILOSEC) 20 MG capsule TAKE (1) CAPSULE BY MOUTH ONCE DAILY.  Marland Kitchen QUEtiapine (SEROQUEL) 100 MG tablet Take 1 tablet (100 mg total) by mouth at bedtime.  . rosuvastatin (CRESTOR) 40 MG tablet Take 1 tablet (40 mg total) by mouth daily.  . SUMAtriptan (IMITREX) 50 MG tablet Take 1 tablet (50 mg total) by mouth once a week. (Patient taking differently: Take 25 mg by mouth 2 (two) times a week. 1/2 tab mon and Friday)  .  traZODone (DESYREL) 150 MG tablet Take 1 tablet (150 mg total) by mouth at bedtime.   No facility-administered encounter medications on file as of 10/23/2020.    Allergies (verified) Advil [ibuprofen]   History: Past Medical History:  Diagnosis Date  . Adenomatous polyps   . Anxiety   . Chronic constipation   . Chronic tension headaches   . CKD (chronic kidney disease) stage 3, GFR 30-59 ml/min (HCC)   . Depression   . Factitious disorder   . GERD (gastroesophageal reflux disease)   . Hyperlipidemia   . Hypertension   . Intellectual disability due to developmental disorder, unspecified   . Mental retardation   . Microscopic hematuria   . Migraine   . Obesity    Past Surgical History:  Procedure Laterality Date  . CARPAL TUNNEL RELEASE    . COLONOSCOPY WITH PROPOFOL N/A 06/27/2017   Procedure: COLONOSCOPY WITH PROPOFOL;  Surgeon: Manya Silvas, MD;  Location: [facility] ENDOSCOPY;  Service: Endoscopy;  Laterality: N/A;  . CYSTOSCOPY W/ RETROGRADES Bilateral 11/15/2015   Procedure: CYSTOSCOPY WITH RETROGRADE PYELOGRAM;  Surgeon: Hollice Espy, MD;  Location: [facility] ORS;  Service: Urology;  Laterality: Bilateral;  . US ECHOCARDIOGRAPHY  2012   showed mild heart failure (LVEF 52%); small pericardial effusion   Family History  Problem Relation Age of Onset  . Heart disease Father   . Hyperlipidemia Father   . Kidney disease Father   . Breast cancer Maternal Aunt   . Prostate cancer Neg Hx    Social History   Socioeconomic History  . Marital status: Single    Spouse name: Not on file  . Number of children: Not on file  . Years of education: Not on file  . Highest education level: High school graduate  Occupational History  . Occupation: Ship broker  Tobacco Use  . Smoking status: Never Smoker  . Smokeless tobacco: Never Used  Vaping Use  . Vaping Use: Never used  Substance and Sexual Activity  . Alcohol use: No  . Drug use: No  . Sexual activity: Never  Other Topics  Concern  . Not on file  Social History Narrative   Goes to [employer]   Social Determinants of Health   Financial Resource Strain: Low Risk   . Difficulty of Paying Living Expenses: Not hard at all  Food Insecurity: No Food Insecurity  . Worried About Charity fundraiser in the Last Year: Never true  . Ran Out of Food in the Last Year: Never true  Transportation Needs: No Transportation Needs  . Lack of Transportation (Medical): No  . Lack of Transportation (Non-Medical): No  Physical Activity: Inactive  . Days of Exercise per Week: 0 days  . Minutes of Exercise per Session: 0 min  Stress: No Stress Concern Present  . Feeling of Stress : Not at all  Social Connections:   . Frequency of Communication with Friends and Family: Not on file  . Frequency of Social Gatherings with Friends and Family: Not on  file  . Attends Religious Services: Not on file  . Active Member of Clubs or Organizations: Not on file  . Attends Archivist Meetings: Not on file  . Marital Status: Not on file    Tobacco Counseling Counseling given: Not Answered   Clinical Intake:  Pre-visit preparation completed: Yes  Pain : No/denies pain     Nutritional Status: BMI > 30  Obese Nutritional Risks: None Diabetes: No  How often do you need to have someone help you when you read instructions, pamphlets, or other written materials from your doctor or pharmacy?: 3 - Sometimes  Diabetic? no  Interpreter Needed?: No  Information entered by :: NAllen LPN   Activities of Daily Living In your present state of health, do you have any difficulty performing the following activities: 10/23/2020  Hearing? N  Vision? N  Difficulty concentrating or making decisions? N  Walking or climbing stairs? N  Dressing or bathing? N  Doing errands, shopping? Y  Comment someone always with  Preparing Food and eating ? Y  Comment prepared by caregiver  Using the Toilet? N  In the past six months, have you  accidently leaked urine? Y  Comment holds too long  Do you have problems with loss of bowel control? N  Managing your Medications? Y  Comment caregiver manages  Managing your Finances? Y  Comment guardian Engineer, production or managing your Housekeeping? (No Data)  Comment caregiver manages  Some recent data might be hidden    Patient Care Team: Venita Lick, NP as PCP - General (Nurse Practitioner) Kathrine Haddock, NP as PCP - Family Medicine (Nurse Practitioner) Anabel Bene, MD as Referring Physician (Neurology) Clapacs, Madie Reno, MD (Psychiatry) Anthonette Legato, MD (Internal Medicine)  Indicate any recent Medical Services you may have received from other than [facility] providers in the past year (date may be approximate).     Assessment:   This is a routine wellness examination for Celeste.  Hearing/Vision screen  Hearing Screening   125Hz  250Hz  500Hz  1000Hz  2000Hz  3000Hz  4000Hz  6000Hz  8000Hz   Right ear:           Left ear:           Vision Screening Comments: Regular eye exams, [facility]  Dietary issues and exercise activities discussed: Current Exercise Habits: The patient does not participate in regular exercise at present  Goals    . DIET - INCREASE WATER INTAKE     Recommend continue to drink at least 6-8 glasses of water a day     . Patient Stated     10/23/2020, wants to lose weight      Depression Screen PHQ 2/9 Scores 10/23/2020 09/13/2020 10/20/2019 07/14/2018 07/02/2018 01/09/2018 07/02/2017  PHQ - 2 Score 0 0 2 1 2 1  0  PHQ- 9 Score 0 0 5 8 6 4  -    Fall Risk Fall Risk  10/23/2020 10/20/2019 07/02/2018 01/09/2018 07/02/2017  Falls in the past year? 0 0 No No No  Number falls in past yr: - 0 - - -  Injury with Fall? - 0 - - -  Risk for fall due to : Medication side effect - - - -  Follow up Falls evaluation completed;Education provided;Falls prevention discussed - - - -    Any stairs in or around the home? Yes  If so, are there any without  handrails? No  Home free of loose throw rugs in walkways, pet beds, electrical cords, etc? Yes  Adequate lighting in your home to reduce risk of falls? Yes   ASSISTIVE DEVICES UTILIZED TO PREVENT FALLS:  Life alert? No  Use of a cane, walker or w/c? No  Grab bars in the bathroom? Yes  Shower chair or bench in shower? No  Elevated toilet seat or a handicapped toilet? No   TIMED UP AND GO:  Was the test performed? No   Cognitive Function:     6CIT Screen 10/23/2020 07/02/2018 07/02/2017  What Year? 0 points 0 points 0 points  What month? 0 points 0 points 0 points  What time? 3 points 0 points 0 points  Count back from 20 0 points 0 points 2 points  Months in reverse 0 points 0 points 0 points  Repeat phrase 4 points 0 points 2 points  Total Score 7 0 4    Immunizations Immunization History  Administered Date(s) Administered  . Influenza,inj,Quad PF,6+ Mos 09/20/2015, 10/14/2016, 09/26/2017, 10/02/2018, 08/10/2019, 09/13/2020  . Influenza-Unspecified 08/26/2014  . Moderna SARS-COVID-2 Vaccination 12/17/2019, 01/14/2020  . Td 10/20/2008    TDAP status: Due, Education has been provided regarding the importance of this vaccine. Advised may receive this vaccine at local pharmacy or Health Dept. Aware to provide a copy of the vaccination record if obtained from local pharmacy or Health Dept. Verbalized acceptance and understanding. Flu Vaccine status: Up to date Pneumococcal vaccine status: Up to date Covid-19 vaccine status: Completed vaccines  Qualifies for Shingles Vaccine? Yes   Zostavax completed No   Shingrix Completed?: No.    Education has been provided regarding the importance of this vaccine. Patient has been advised to call insurance company to determine out of pocket expense if they have not yet received this vaccine. Advised may also receive vaccine at local pharmacy or Health Dept. Verbalized acceptance and understanding.  Screening Tests Health Maintenance  Topic  Date Due  . PAP SMEAR-Modifier  02/21/2017  . TETANUS/TDAP  10/20/2018  . MAMMOGRAM  05/31/2022  . COLONOSCOPY  06/27/2022  . INFLUENZA VACCINE  Completed  . COVID-19 Vaccine  Completed  . Hepatitis C Screening  Completed  . HIV Screening  Completed    Health Maintenance  Health Maintenance Due  Topic Date Due  . PAP SMEAR-Modifier  02/21/2017  . TETANUS/TDAP  10/20/2018    Colorectal cancer screening: Completed 06/27/2017. Repeat every 5 years Mammogram status: Completed 05/31/2020. Repeat every year Bone Density status: n/a  Lung Cancer Screening: (Low Dose CT Chest recommended if Age 35-80 years, 30 pack-year currently smoking OR have quit w/in 15years.) does not qualify.   Lung Cancer Screening Referral: no  Additional Screening:  Hepatitis C Screening: does qualify; Completed 04/08/2016  Vision Screening: Recommended annual ophthalmology exams for early detection of glaucoma and other disorders of the eye. Is the patient up to date with their annual eye exam?  Yes  Who is the provider or what is the name of the office in which the patient attends annual eye exams? [facility] If pt is not established with a provider, would they like to be referred to a provider to establish care? No .   Dental Screening: Recommended annual dental exams for proper oral hygiene  Community Resource Referral / Chronic Care Management: CRR required this visit?  No   CCM required this visit?  No      Plan:     I have personally reviewed and noted the following in the patient's chart:   . Medical and social history . Use of alcohol, tobacco  or illicit drugs  . Current medications and supplements . Functional ability and status . Nutritional status . Physical activity . Advanced directives . List of other physicians . Hospitalizations, surgeries, and ER visits in previous 12 months . Vitals . Screenings to include cognitive, depression, and falls . Referrals and appointments  In  addition, I have reviewed and discussed with patient certain preventive protocols, quality metrics, and best practice recommendations. A written personalized care plan for preventive services as well as general preventive health recommendations were provided to patient.     Kellie Simmering, LPN   [id number]/23/[id number]   Nurse Notes:

## 2020-11-01 DIAGNOSIS — H3554 Dystrophies primarily involving the retinal pigment epithelium: Secondary | ICD-10-CM | POA: Diagnosis not present

## 2020-11-06 ENCOUNTER — Other Ambulatory Visit: Payer: Self-pay | Admitting: Nurse Practitioner

## 2020-12-04 ENCOUNTER — Other Ambulatory Visit: Payer: Self-pay | Admitting: Nurse Practitioner

## 2020-12-04 ENCOUNTER — Other Ambulatory Visit: Payer: Self-pay | Admitting: Psychiatry

## 2020-12-04 NOTE — Telephone Encounter (Signed)
Aspirin is due, Magnesium is not due.

## 2020-12-04 NOTE — Telephone Encounter (Signed)
Requested medication (s) are due for refill today Yes  Requested medication (s) are on the active medication list Yes  Future visit scheduled Yes  Next OV 03/14/21.  Note to clinic-medication fails lab protocol-last Mg results was 03/14/21.    Requested Prescriptions  Pending Prescriptions Disp Refills   magnesium oxide (MAG-OX) 400 (241.3 Mg) MG tablet [Pharmacy Med Name: MAGNESIUM OXIDE 400 MG TABLET] 30 tablet 0    Sig: TAKE 1 TABLET BY MOUTH ONCE DAILY.      Endocrinology:  Minerals - Magnesium Supplementation Failed - 12/04/2020 11:57 AM      Failed - Mg Level in normal range and within 360 days    Magnesium  Date Value Ref Range Status  12/28/2013 1.7 (L) mg/dL Final    Comment:    3.5-5.9 THERAPEUTIC RANGE: 4-7 mg/dL TOXIC: > 10 mg/dL  -----------------------           Passed - Valid encounter within last 12 months    Recent Outpatient Visits           2 months ago Schizoaffective disorder, bipolar type (HCC)   [facility] [place], Jolene T, NP   5 months ago Acute right-sided low back pain with right-sided sciatica   [facility] [place], [place] T, NP   8 months ago Mixed hyperlipidemia   [facility] [place], Jolene T, NP   10 months ago Class 3 severe obesity due to excess calories with serious comorbidity and body mass index (BMI) of 40.0 to 44.9 in adult [facility])   [facility] [place], Corrie Dandy T, NP   1 year ago Chronic kidney disease, stage 3 (HCC)   [facility] [place], Dorie Rank, NP       Future Appointments             In 3 months Cannady, Dorie Rank, NP [facility]   In 10 months  [facility]               aspirin 81 MG chewable tablet [Pharmacy Med Name: ASPIRIN 81 MG CHEWABLE TABLET] 30 tablet 0    Sig: TAKE 1 TABLET BY MOUTH ONCE DAILY.      Analgesics:  NSAIDS - aspirin Passed - 12/04/2020 11:57 AM      Passed - Patient is not pregnant      Passed  - Valid encounter within last 12 months    Recent Outpatient Visits           2 months ago Schizoaffective disorder, bipolar type (HCC)   [facility] [place], Jolene T, NP   5 months ago Acute right-sided low back pain with right-sided sciatica   [facility] [place], Amery T, NP   8 months ago Mixed hyperlipidemia   [facility] Cannady, Jolene T, NP   10 months ago Class 3 severe obesity due to excess calories with serious comorbidity and body mass index (BMI) of 40.0 to 44.9 in adult [facility])   [facility] Cann[place], Corrie Dandy T, NP   1 year ago Chronic kidney disease, stage 3 (HCC)   [facility] [place], Dorie Rank, NP       Future Appointments             In 3 months Cannady, Dorie Rank, NP [facility]   In 10 months  [facility]

## 2020-12-25 ENCOUNTER — Other Ambulatory Visit: Payer: Self-pay | Admitting: Psychiatry

## 2020-12-25 MED ORDER — QUETIAPINE FUMARATE 100 MG PO TABS: 100.0000 mg | ORAL_TABLET | Freq: Every day | ORAL | 5 refills | Status: DC

## 2020-12-28 ENCOUNTER — Other Ambulatory Visit: Payer: Self-pay | Admitting: Psychiatry

## 2021-01-01 ENCOUNTER — Other Ambulatory Visit: Payer: Self-pay | Admitting: Psychiatry

## 2021-01-01 MED ORDER — DULOXETINE HCL 30 MG PO CPEP: 30.0000 mg | ORAL_CAPSULE | Freq: Every day | ORAL | 6 refills | Status: DC

## 2021-01-01 MED ORDER — QUETIAPINE FUMARATE 100 MG PO TABS: 100.0000 mg | ORAL_TABLET | Freq: Every day | ORAL | 5 refills | Status: DC

## 2021-01-01 MED ORDER — TRAZODONE HCL 100 MG PO TABS: ORAL_TABLET | ORAL | 5 refills | Status: DC

## 2021-01-01 MED ORDER — CLONAZEPAM 0.5 MG PO TABS: 0.5000 mg | ORAL_TABLET | Freq: Two times a day (BID) | ORAL | 5 refills | Status: DC

## 2021-01-01 MED ORDER — DULOXETINE HCL 60 MG PO CPEP: ORAL_CAPSULE | ORAL | 6 refills | Status: DC

## 2021-01-09 ENCOUNTER — Telehealth: Payer: Self-pay

## 2021-01-09 ENCOUNTER — Other Ambulatory Visit: Payer: Self-pay | Admitting: Nurse Practitioner

## 2021-01-09 NOTE — Telephone Encounter (Signed)
From's for FL12 were dropped of for pt. Forms placed in bin to be reviewed, Kathreen Devoid requesting a call to [phone] when forms are signed or if there needs to be anything else to be done.

## 2021-01-09 NOTE — Telephone Encounter (Signed)
Paperwork placed in box to be completed.

## 2021-01-09 NOTE — Telephone Encounter (Signed)
Forms completed and will place back in my bin -- may call [name] and alert her for pick up.  Thanks:)

## 2021-01-10 NOTE — Telephone Encounter (Signed)
Patient's caregiver notified that forms are ready to be picked up. Placed forms up front and copy placed in scan bin.

## 2021-01-10 NOTE — Telephone Encounter (Signed)
Routing to clinical box

## 2021-01-11 ENCOUNTER — Telehealth (INDEPENDENT_AMBULATORY_CARE_PROVIDER_SITE_OTHER): Payer: Medicare Other | Admitting: Psychiatry

## 2021-01-11 ENCOUNTER — Other Ambulatory Visit: Payer: Self-pay

## 2021-01-11 DIAGNOSIS — F259 Schizoaffective disorder, unspecified: Secondary | ICD-10-CM | POA: Diagnosis not present

## 2021-01-11 DIAGNOSIS — F819 Developmental disorder of scholastic skills, unspecified: Secondary | ICD-10-CM | POA: Diagnosis not present

## 2021-01-11 DIAGNOSIS — F411 Generalized anxiety disorder: Secondary | ICD-10-CM | POA: Diagnosis not present

## 2021-01-11 MED ORDER — TRAZODONE HCL 150 MG PO TABS: 150.0000 mg | ORAL_TABLET | Freq: Every day | ORAL | 2 refills | Status: DC

## 2021-01-11 NOTE — Progress Notes (Signed)
Virtual Visit via Telephone Note  I connected with Rita [name] on 01/11/21 at  2:40 PM EST by telephone and verified that I am speaking with the correct person using two identifiers.  Location: Patient: Home Provider: Hospital   I discussed the limitations, risks, security and privacy concerns of performing an evaluation and management service by telephone and the availability of in person appointments. I also discussed with the patient that there may be a patient responsible charge related to this service. The patient expressed understanding and agreed to proceed.   History of Present Illness: Caregiver just wanted to tell me that the trazodone dose I had put in was incorrect.  Patient should be on 150 mg instead of 200.  No other complaints.  Everything else doing well.    Observations/Objective: No complaints.  Spoke briefly with patient.  She had no complaints either and sounded fine.   Assessment and Plan: Alter dose of trazodone to 150   Follow Up Instructions: Follow-up as scheduled    I discussed the assessment and treatment plan with the patient. The patient was provided an opportunity to ask questions and all were answered. The patient agreed with the plan and demonstrated an understanding of the instructions.   The patient was advised to call back or seek an in-person evaluation if the symptoms worsen or if the condition fails to improve as anticipated.  I provided 5 minutes of non-face-to-face time during this encounter.   Alethia Berthold, MD

## 2021-01-25 ENCOUNTER — Other Ambulatory Visit: Payer: Self-pay | Admitting: Nurse Practitioner

## 2021-01-25 DIAGNOSIS — I10 Essential (primary) hypertension: Secondary | ICD-10-CM

## 2021-01-29 ENCOUNTER — Telehealth: Payer: Medicare Other | Admitting: Psychiatry

## 2021-02-15 ENCOUNTER — Other Ambulatory Visit: Payer: Self-pay

## 2021-02-15 ENCOUNTER — Other Ambulatory Visit: Payer: Self-pay | Admitting: Psychiatry

## 2021-03-06 DIAGNOSIS — N2581 Secondary hyperparathyroidism of renal origin: Secondary | ICD-10-CM | POA: Diagnosis not present

## 2021-03-06 DIAGNOSIS — I10 Essential (primary) hypertension: Secondary | ICD-10-CM | POA: Diagnosis not present

## 2021-03-06 DIAGNOSIS — N1832 Chronic kidney disease, stage 3b: Secondary | ICD-10-CM | POA: Diagnosis not present

## 2021-03-14 ENCOUNTER — Ambulatory Visit: Payer: Medicare Other | Admitting: Nurse Practitioner

## 2021-03-19 ENCOUNTER — Telehealth: Payer: Self-pay

## 2021-03-19 NOTE — Telephone Encounter (Signed)
Lvm to change 4/19 appt to virtual

## 2021-03-20 ENCOUNTER — Telehealth: Payer: Self-pay

## 2021-03-20 ENCOUNTER — Ambulatory Visit (INDEPENDENT_AMBULATORY_CARE_PROVIDER_SITE_OTHER): Payer: Medicare Other | Admitting: Nurse Practitioner

## 2021-03-20 ENCOUNTER — Encounter: Payer: Self-pay | Admitting: Nurse Practitioner

## 2021-03-20 ENCOUNTER — Other Ambulatory Visit: Payer: Self-pay

## 2021-03-20 DIAGNOSIS — I10 Essential (primary) hypertension: Secondary | ICD-10-CM | POA: Diagnosis not present

## 2021-03-20 DIAGNOSIS — R7301 Impaired fasting glucose: Secondary | ICD-10-CM | POA: Diagnosis not present

## 2021-03-20 DIAGNOSIS — E782 Mixed hyperlipidemia: Secondary | ICD-10-CM

## 2021-03-20 DIAGNOSIS — F819 Developmental disorder of scholastic skills, unspecified: Secondary | ICD-10-CM | POA: Diagnosis not present

## 2021-03-20 DIAGNOSIS — E210 Primary hyperparathyroidism: Secondary | ICD-10-CM

## 2021-03-20 DIAGNOSIS — E559 Vitamin D deficiency, unspecified: Secondary | ICD-10-CM

## 2021-03-20 DIAGNOSIS — F250 Schizoaffective disorder, bipolar type: Secondary | ICD-10-CM | POA: Diagnosis not present

## 2021-03-20 DIAGNOSIS — Z23 Encounter for immunization: Secondary | ICD-10-CM

## 2021-03-20 DIAGNOSIS — N1832 Chronic kidney disease, stage 3b: Secondary | ICD-10-CM | POA: Diagnosis not present

## 2021-03-20 MED ORDER — LISINOPRIL 5 MG PO TABS
5.0000 mg | ORAL_TABLET | Freq: Every day | ORAL | 4 refills | Status: DC
Start: 2021-03-20 — End: 2022-01-01

## 2021-03-20 NOTE — Telephone Encounter (Signed)
Spoke with patient caregiver [name]. She states she would give our office a callback to provide the brand of adults diapers/pull-up patient wears.

## 2021-03-20 NOTE — Telephone Encounter (Signed)
Prescription signature and faxed over to [facility] and left message for patient caregiver to notify if she would like a copy of the prescription.

## 2021-03-20 NOTE — Assessment & Plan Note (Signed)
Ongoing, continue supplement and check levels outpatient.

## 2021-03-20 NOTE — Telephone Encounter (Signed)
-----   Message from Venita Lick, NP sent at 03/20/2021  9:56 AM EDT ----- Please write script for adult pull-up for patient and have another provider in office sign for me, since I am out.  The caregiver will come pick script up when patient gets tetanus shot.  You may need to talk to caregiver further for which pull up is used.

## 2021-03-20 NOTE — Assessment & Plan Note (Signed)
Chronic, stable with BP at goal recent visits with specialists and PCP.  Continue current medication regimen and collaboration with nephrology.  Lisinopril for kidney protection, refills sent in.  Recommend checking BP at home on occasion and documenting.  Return in 6 months.  Recent CMP and labs with nephrology reviewed, will not repeat today.

## 2021-03-20 NOTE — Progress Notes (Signed)
LMP  (LMP Unknown)    Subjective:    Patient ID: Rita [name], female    DOB: 13-Dec-1960, 60 y.o.   MRN: [id number]  HPI: Rita [name] is a 60 y.o. female  Chief Complaint  Patient presents with  . Chronic Kidney Disease  . Hyperlipidemia  . Hypertension  . Mood   . Medication Refill    Patient caretaker states patient will need refills and requesting a prescription for the adult diapers. Patient caregiver would also like to discuss having an appointment for immunization.     . This visit was completed via telephone due to the restrictions of the COVID-19 pandemic. All issues as above were discussed and addressed but no physical exam was performed. If it was felt that the patient should be evaluated in the office, they were directed there. The patient verbally consented to this visit. Patient was unable to complete an audio/visual visit due to Lack of equipment. Due to the catastrophic nature of the COVID-19 pandemic, this visit was done through audio contact only. . Location of the patient: home . Location of the provider: home . Those involved with this call:  . Provider: Marnee Guarneri, DNP . CMA: Irena Reichmann, CMA . Front Desk/Registration: Roe Rutherford  . Time spent on call: 21 minutes on the phone discussing health concerns. 15 minutes total spent in review of patient's record and preparation of their chart.  . I verified patient identity using two factors (patient name and date of birth). Patient consents verbally to being seen via telemedicine visit today.   Her caregiver is present on phone and provides majority of HPI.  HYPERTENSION / HYPERLIPIDEMIA WITH CKD Last saw nephrology, Dr. Holley Raring, on 03/06/21, labs GFR 31, CRT 1.78, PTH 40.  Continues on Crestor, Lisinopril and Carvedilol + ASA. Satisfied with current treatment? yes Duration of hypertension: chronic BP monitoring frequency: not checking BP range:  BP medication side effects: no Duration of  hyperlipidemia: chronic Cholesterol medication side effects: no Cholesterol supplements: none Medication compliance: good compliance Aspirin: yes Recent stressors: no Recurrent headaches: no Visual changes: no Palpitations: no Dyspnea: no Chest pain: no Lower extremity edema: no Dizzy/lightheaded: no   IFG: No current medications, diet focused.  Recent A1C 5.8% October 2021. Taking Vitamin D daily for deficiency -- last level 24.1.  Has been drinking more water and less sodas. Hypoglycemic episodes:no Polydipsia/polyuria: no Visual disturbance: no Chest pain: no Paresthesias: no   SCHIZOAFFECTIVE DISORDER Followed by psychiatry and last seen by Dr. Weber Cooks on 01/11/21 -- Trazodone was changed to 150 MG.  She does have learning disabilities and lives in stable group home.  Caregiver is requesting a prescription for adult pull-ups  Patient uses these daily as needed for incontinence episodes if present.   Mood status: stable Satisfied with current treatment?: yes Symptom severity: mild  Duration of current treatment : chronic Side effects: no Medication compliance: good compliance Psychotherapy/counseling: none Depressed mood: no Anxious mood: no Anhedonia: no Significant weight loss or gain: no Insomnia: none Fatigue: no Feelings of worthlessness or guilt: no Impaired concentration/indecisiveness: no Suicidal ideations: no Hopelessness: no Crying spells: no Depression screen [facility] 2/9 03/20/2021 10/23/2020 09/13/2020 10/20/2019 07/14/2018  Decreased Interest 0 0 0 1 0  Down, Depressed, Hopeless 0 0 0 1 1  PHQ - 2 Score 0 0 0 2 1  Altered sleeping 0 0 0 1 1  Tired, decreased energy 0 0 0 1 2  Change in appetite 0 0 0 0 1  Feeling bad or failure about yourself  0 0 0 1 0  Trouble concentrating 0 0 0 0 1  Moving slowly or fidgety/restless 0 0 0 0 0  Suicidal thoughts 0 0 0 0 2  PHQ-9 Score 0 0 0 5 8  Difficult doing work/chores - - - Not difficult at all -    Relevant  past medical, surgical, family and social history reviewed and updated as indicated. Interim medical history since our last visit reviewed. Allergies and medications reviewed and updated.  Review of Systems  Constitutional: Negative for activity change, appetite change, diaphoresis, fatigue and fever.  Respiratory: Negative for cough, chest tightness and shortness of breath.   Cardiovascular: Negative for chest pain, palpitations and leg swelling.  Gastrointestinal: Negative.   Endocrine: Negative for polydipsia, polyphagia and polyuria.  Neurological: Negative.   Psychiatric/Behavioral: Negative.     Per HPI unless specifically indicated above     Objective:    LMP  (LMP Unknown)   Wt Readings from Last 3 Encounters:  10/23/20 224 lb (101.6 kg)  09/13/20 223 lb 9.6 oz (101.4 kg)  06/22/20 217 lb 3.2 oz (98.5 kg)    Physical Exam   Unable to perform due to telephone visit only.  Results for orders placed or performed in visit on 09/13/20  Comprehensive metabolic panel  Result Value Ref Range   Glucose 86 65 - 99 mg/dL   BUN 14 6 - 24 mg/dL   Creatinine, Ser 1.52 (H) 0.57 - 1.00 mg/dL   GFR calc non Af Amer 38 (L) >59 mL/min/1.73   GFR calc Af Amer 43 (L) >59 mL/min/1.73   BUN/Creatinine Ratio 9 9 - 23   Sodium 139 134 - 144 mmol/L   Potassium 4.7 3.5 - 5.2 mmol/L   Chloride 102 96 - 106 mmol/L   CO2 24 20 - 29 mmol/L   Calcium 9.4 8.7 - 10.2 mg/dL   Total Protein 6.9 6.0 - 8.5 g/dL   Albumin 3.9 3.8 - 4.9 g/dL   Globulin, Total 3.0 1.5 - 4.5 g/dL   Albumin/Globulin Ratio 1.3 1.2 - 2.2   Bilirubin Total <0.2 0.0 - 1.2 mg/dL   Alkaline Phosphatase 126 (H) 44 - 121 IU/L   AST 17 0 - 40 IU/L   ALT 22 0 - 32 IU/L  TSH  Result Value Ref Range   TSH 1.400 0.450 - 4.500 uIU/mL  Lipid Panel w/o Chol/HDL Ratio  Result Value Ref Range   Cholesterol, Total 183 100 - 199 mg/dL   Triglycerides 322 (H) 0 - 149 mg/dL   HDL 41 >39 mg/dL   VLDL Cholesterol Cal 53 (H) 5 - 40  mg/dL   LDL Chol Calc (NIH) 89 0 - 99 mg/dL  VITAMIN D 25 Hydroxy (Vit-D Deficiency, Fractures)  Result Value Ref Range   Vit D, 25-Hydroxy 24.1 (L) 30.0 - 100.0 ng/mL  HgB A1c  Result Value Ref Range   Hgb A1c MFr Bld 5.8 (H) 4.8 - 5.6 %   Est. average glucose Bld gHb Est-mCnc 120 mg/dL      Assessment & Plan:   Problem List Items Addressed This Visit      Cardiovascular and Mediastinum   Essential hypertension    Chronic, stable with BP at goal recent visits with specialists and PCP.  Continue current medication regimen and collaboration with nephrology.  Lisinopril for kidney protection, refills sent in.  Recommend checking BP at home on occasion and documenting.  Return in 6 months.  Recent CMP and labs with nephrology reviewed, will not repeat today.      Relevant Medications   lisinopril (ZESTRIL) 5 MG tablet     Endocrine   IFG (impaired fasting glucose)    Recommend continued diet focus, cutting back on soda and drinking more water + weight loss.  A1C October 5.8%.  No current medications.  Recheck A1C outpatient.      Relevant Orders   Bayer DCA Hb A1c Waived   Primary hyperparathyroidism ([place])    History of per nephrology notes, with recent PTH level 40.  Continue collaboration with nephrology, recent note reviewed.  Recent CMP and labs with nephrology reviewed, will not repeat today.        Genitourinary   Chronic kidney disease, stage 3 (HCC)    Chronic, stable.  Continue collaboration with nephrology and Lisinopril for kidney protection, refills sent and recent noted reviewed.  Recent CMP and labs with nephrology reviewed, will not repeat today.        Other   Learning disability    Baseline, continue to monitor.  Lives in stable group home setting.  Will write script for adult pull-up briefs for patient.      Hyperlipidemia    Chronic, ongoing.  Continue current medication regimen and adjust as needed.  Lipid panel outpatient, fasting.      Relevant  Medications   lisinopril (ZESTRIL) 5 MG tablet   Other Relevant Orders   Lipid Panel w/o Chol/HDL Ratio   Schizoaffective disorder, bipolar type ([place]) - Primary    Chronic, stable, followed by psychiatry.  Continue current medication regimen as prescribed by them, recent February note reviewed.  Denies SI/HI.      Morbid obesity ([place])    BMI 45.16 last visit with HTN, CKD.  Recommended eating smaller high protein, low fat meals more frequently and exercising 30 mins a day 5 times a week with a goal of 10-15lb weight loss in the next 3 months. Patient voiced their understanding and motivation to adhere to these recommendations.       Vitamin D deficiency    Ongoing, continue supplement and check levels outpatient.      Relevant Orders   VITAMIN D 25 Hydroxy (Vit-D Deficiency, Fractures)    Other Visit Diagnoses    Need for Td vaccine       Tetanus shot outpatient.   Relevant Orders   Td vaccine greater than or equal to 7yo preservative free IM      I discussed the assessment and treatment plan with the patient. The patient was provided an opportunity to ask questions and all were answered. The patient agreed with the plan and demonstrated an understanding of the instructions.   The patient was advised to call back or seek an in-person evaluation if the symptoms worsen or if the condition fails to improve as anticipated.   I provided 21+ minutes of time during this encounter.  Follow up plan: Return in about 6 months (around 09/19/2021) for Annual physical.

## 2021-03-20 NOTE — Assessment & Plan Note (Signed)
Recommend continued diet focus, cutting back on soda and drinking more water + weight loss.  A1C October 5.8%.  No current medications.  Recheck A1C outpatient.

## 2021-03-20 NOTE — Assessment & Plan Note (Addendum)
Chronic, stable, followed by psychiatry.  Continue current medication regimen as prescribed by them, recent February note reviewed.  Denies SI/HI.

## 2021-03-20 NOTE — Assessment & Plan Note (Signed)
BMI 45.16 last visit with HTN, CKD.  Recommended eating smaller high protein, low fat meals more frequently and exercising 30 mins a day 5 times a week with a goal of 10-15lb weight loss in the next 3 months. Patient voiced their understanding and motivation to adhere to these recommendations.

## 2021-03-20 NOTE — Telephone Encounter (Signed)
FYI

## 2021-03-20 NOTE — Assessment & Plan Note (Signed)
Baseline, continue to monitor.  Lives in stable group home setting.  Will write script for adult pull-up briefs for patient.

## 2021-03-20 NOTE — Telephone Encounter (Signed)
Prescription written and placed in bin for signature from provider.

## 2021-03-20 NOTE — Assessment & Plan Note (Signed)
History of per nephrology notes, with recent PTH level 40.  Continue collaboration with nephrology, recent note reviewed.  Recent CMP and labs with nephrology reviewed, will not repeat today.

## 2021-03-20 NOTE — Telephone Encounter (Signed)
Caller stated the pt needs large [employer] pull-ups.

## 2021-03-20 NOTE — Assessment & Plan Note (Signed)
Chronic, ongoing.  Continue current medication regimen and adjust as needed.  Lipid panel outpatient, fasting.

## 2021-03-20 NOTE — Assessment & Plan Note (Signed)
Chronic, stable.  Continue collaboration with nephrology and Lisinopril for kidney protection, refills sent and recent noted reviewed.  Recent CMP and labs with nephrology reviewed, will not repeat today.

## 2021-03-20 NOTE — Patient Instructions (Signed)
Chronic Kidney Disease, Adult Chronic kidney disease is when lasting damage happens to the kidneys slowly over a long time. The kidneys help to:  Make pee (urine).  Make hormones.  Keep the right amount of fluids and chemicals in the body. Most often, this disease does not go away. You must take steps to help keep the kidney damage from getting worse. If steps are not taken, the kidneys might stop working forever. What are the causes?  Diabetes.  High blood pressure.  Diseases that affect the heart and blood vessels.  Other kidney diseases.  Diseases of the body's disease-fighting system.  A problem with the flow of pee.  Infections of the organs that make pee, store it, and take it out of the body.  Swelling or irritation of your blood vessels. What increases the risk?  Getting older.  Having someone in your family who has kidney disease or kidney failure.  Having a disease caused by genes.  Taking medicines often that harm the kidneys.  Being near or having contact with harmful substances.  Being very overweight.  Using tobacco now or in the past. What are the signs or symptoms?  Feeling very tired.  Having a swollen face, legs, ankles, or feet.  Feeling like you may vomit or vomiting.  Not feeling hungry.  Being confused or not able to focus.  Twitches and cramps in the leg muscles or other muscles.  Dry, itchy skin.  A taste of metal in your mouth.  Making less pee, or making more pee.  Shortness of breath.  Trouble sleeping. You may also become anemic or get weak bones. Anemic means there is not enough red blood cells or hemoglobin in your blood. You may get symptoms slowly. You may not notice them until the kidney damage gets very bad. How is this treated? Often, there is no cure for this disease. Treatment can help with symptoms and help keep the disease from getting worse. You may need to:  Avoid alcohol.  Avoid foods that are high in  salt, potassium, phosphorous, and protein.  Take medicines for symptoms and to help control other conditions.  Have dialysis. This treatment gets harmful waste out of your body.  Treat other problems that cause your kidney disease or make it worse. Follow these instructions at home: Medicines  Take over-the-counter and prescription medicines only as told by your doctor.  Do not take any new medicines, vitamins, or supplements unless your doctor says it is okay. Lifestyle  Do not smoke or use any products that contain nicotine or tobacco. If you need help quitting, ask your doctor.  If you drink alcohol: ? Limit how much you use to:  0-1 drink a day for women who are not pregnant.  0-2 drinks a day for men. ? Know how much alcohol is in your drink. In the [place] one drink equals one 12 oz bottle of beer (355 mL), one 5 oz glass of wine (148 mL), or one 1 oz glass of hard liquor (44 mL).  Stay at a healthy weight. If you need help losing weight, ask your doctor.   General instructions  Follow instructions from your doctor about what you cannot eat or drink.  Track your blood pressure at home. Tell your doctor about any changes.  If you have diabetes, track your blood sugar.  Exercise at least 30 minutes a day, 5 days a week.  Keep your shots (vaccinations) up to date.  Keep all follow-up visits.     Where to find more information  American Association of Kidney Patients: www.aakp.org  National Kidney Foundation: www.kidney.org  American Kidney Fund: www.akfinc.org  Life Options: www.lifeoptions.org  Kidney School: www.kidneyschool.org Contact a doctor if:  Your symptoms get worse.  You get new symptoms. Get help right away if:  You get symptoms of end-stage kidney disease. These include: ? Headaches. ? Losing feeling in your hands or feet. ? Easy bruising. ? Having hiccups often. ? Chest pain. ? Shortness of breath. ? Lack of menstrual periods, in  women.  You have a fever.  You make less pee than normal.  You have pain or you bleed when you pee or poop. These symptoms may be an emergency. Get help right away. Call your local emergency services (911 in the [place]  Do not wait to see if the symptoms will go away.  Do not drive yourself to the hospital. Summary  Chronic kidney disease is when lasting damage happens to the kidneys slowly over a long time.  Causes of this disease include diabetes and high blood pressure.  Often, there is no cure for this disease. Treatment can help symptoms and help keep the disease from getting worse.  Treatment may involve lifestyle changes, medicines, and dialysis. This information is not intended to replace advice given to you by your health care provider. Make sure you discuss any questions you have with your health care provider. Document Revised: 02/23/2020 Document Reviewed: 02/23/2020 Elsevier Patient Education  2021 Elsevier Inc.  

## 2021-03-21 ENCOUNTER — Other Ambulatory Visit: Payer: Self-pay | Admitting: Nurse Practitioner

## 2021-03-22 ENCOUNTER — Telehealth: Payer: Self-pay | Admitting: Nurse Practitioner

## 2021-03-22 NOTE — Telephone Encounter (Signed)
Pt is calling to ask if she can be placed on saxinda for her weight. Pt states that at this time she 244.0 Please advise- [phone]

## 2021-03-23 NOTE — Telephone Encounter (Signed)
Called and spoke with Rita [name] pt's caregiver she states that due to her kidneys she is not suppose to be asking for medication for weight loss

## 2021-03-23 NOTE — Telephone Encounter (Signed)
Not at this time, on review of nephrology notes this has not been recommended either.  Recommend continued focus on diet changes and regular activity.

## 2021-03-26 ENCOUNTER — Other Ambulatory Visit: Payer: Self-pay

## 2021-03-26 ENCOUNTER — Ambulatory Visit (INDEPENDENT_AMBULATORY_CARE_PROVIDER_SITE_OTHER): Payer: Medicare Other

## 2021-03-26 ENCOUNTER — Other Ambulatory Visit: Payer: Medicare Other

## 2021-03-26 DIAGNOSIS — E782 Mixed hyperlipidemia: Secondary | ICD-10-CM

## 2021-03-26 DIAGNOSIS — R7301 Impaired fasting glucose: Secondary | ICD-10-CM

## 2021-03-26 DIAGNOSIS — Z23 Encounter for immunization: Secondary | ICD-10-CM

## 2021-03-26 DIAGNOSIS — E559 Vitamin D deficiency, unspecified: Secondary | ICD-10-CM | POA: Diagnosis not present

## 2021-03-26 LAB — BAYER DCA HB A1C WAIVED: HB A1C (BAYER DCA - WAIVED): 5.8 % (ref <7.0)

## 2021-03-27 LAB — LIPID PANEL W/O CHOL/HDL RATIO
Cholesterol, Total: 167 mg/dL (ref 100–199)
HDL: 53 mg/dL (ref >39)
LDL Chol Calc (NIH): 74 mg/dL (ref 0–99)
Triglycerides: 244 mg/dL — ABNORMAL HIGH (ref 0–149)
VLDL Cholesterol Cal: 40 mg/dL (ref 5–40)

## 2021-03-27 LAB — VITAMIN D 25 HYDROXY (VIT D DEFICIENCY, FRACTURES): Vit D, 25-Hydroxy: 35.6 ng/mL (ref 30.0–100.0)

## 2021-03-27 NOTE — Progress Notes (Signed)
Contacted via [name] morning Rita [name] -- your Vitamin D level has improved, as have cholesterol levels.  At this time continue all current medications.  The A1C is the diabetes testing we talked about, this looks at your blood sugars over the past 3 months and turns the average into a number.  Your number is 65.8%, meaning you are prediabetic.  Any number 5.7 to 6.4 is considered prediabetes and any number 6.5 or greater is considered diabetes.   I would recommend heavy focus on decreasing foods high in sugar and your intake of things like bread products, pasta, and rice.  The American Diabetes Association online has a large amount of information on diet changes to make.  We will recheck this number in 3-6 months to ensure you are not continuing to trend upwards and move into diabetes.  Have a good day. Keep being awesome!!  Thank you for allowing me to participate in your care. Kindest regards, Rita [name]

## 2021-04-17 ENCOUNTER — Other Ambulatory Visit: Payer: Self-pay | Admitting: Nurse Practitioner

## 2021-04-18 ENCOUNTER — Other Ambulatory Visit: Payer: Self-pay | Admitting: Nurse Practitioner

## 2021-04-18 DIAGNOSIS — Z1231 Encounter for screening mammogram for malignant neoplasm of breast: Secondary | ICD-10-CM

## 2021-05-15 ENCOUNTER — Other Ambulatory Visit: Payer: Self-pay | Admitting: Psychiatry

## 2021-05-15 ENCOUNTER — Other Ambulatory Visit: Payer: Self-pay | Admitting: Nurse Practitioner

## 2021-06-08 ENCOUNTER — Other Ambulatory Visit: Payer: Self-pay | Admitting: Nurse Practitioner

## 2021-06-08 ENCOUNTER — Other Ambulatory Visit: Payer: Self-pay | Admitting: Psychiatry

## 2021-06-14 ENCOUNTER — Ambulatory Visit
Admission: RE | Admit: 2021-06-14 | Discharge: 2021-06-14 | Disposition: A | Discharge status: Home / Self Care | Payer: Medicare Other | Source: Ambulatory Visit | Attending: Nurse Practitioner | Admitting: Nurse Practitioner

## 2021-06-14 ENCOUNTER — Other Ambulatory Visit: Payer: Self-pay

## 2021-06-14 DIAGNOSIS — Z1231 Encounter for screening mammogram for malignant neoplasm of breast: Secondary | ICD-10-CM | POA: Diagnosis not present

## 2021-07-11 ENCOUNTER — Encounter: Payer: Self-pay | Admitting: Nurse Practitioner

## 2021-07-11 ENCOUNTER — Telehealth (INDEPENDENT_AMBULATORY_CARE_PROVIDER_SITE_OTHER): Payer: Medicare Other | Admitting: Nurse Practitioner

## 2021-07-11 ENCOUNTER — Other Ambulatory Visit: Payer: Self-pay

## 2021-07-11 ENCOUNTER — Other Ambulatory Visit: Payer: Self-pay | Admitting: Nurse Practitioner

## 2021-07-11 ENCOUNTER — Telehealth: Payer: Self-pay

## 2021-07-11 DIAGNOSIS — U071 COVID-19: Secondary | ICD-10-CM | POA: Diagnosis not present

## 2021-07-11 MED ORDER — MOLNUPIRAVIR EUA 200MG CAPSULE: 4.0000 | ORAL_CAPSULE | Freq: Two times a day (BID) | ORAL | 0 refills | Status: AC

## 2021-07-11 NOTE — Telephone Encounter (Signed)
Routing to provider to advise. Would you like to do a virtual visit with this patient and her caregiver?

## 2021-07-11 NOTE — Telephone Encounter (Signed)
Copied from [place] [id number]. Topic: General - Other >> Jul 11, 2021 11:14 AM Yvette Rack wrote: Reason for CRM: Pt caregiver called to report that pt tested positive for Covid and would like to request Rx for antiviral medication. 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 Cb# [phone]

## 2021-07-11 NOTE — Progress Notes (Signed)
LMP  (LMP Unknown)    Subjective:    Patient ID: Rita [name], female    DOB: 11/07/1961, 60 y.o.   MRN: [id number]  HPI: Rita [name] is a 60 y.o. female  Chief Complaint  Patient presents with   Covid Positive    Tested positive today, started with the runny nose on Monday. Only symptom is a stuffy nose, she throat was scratchy   UPPER RESPIRATORY TRACT INFECTION Worst symptom: Symptoms started Monday Fever: no Cough: yes Shortness of breath: no Wheezing: no Chest pain: no Chest tightness: no Chest congestion: no Nasal congestion: yes Runny nose: yes Post nasal drip: yes Sneezing: yes Sore throat: yes Swollen glands: no Sinus pressure: yes Headache: yes Face pain: no Toothache: no Ear pain: no bilateral Ear pressure: no bilateral Eyes red/itching:no Eye drainage/crusting: no  Vomiting: no Rash: no Fatigue: yes Sick contacts: no Strep contacts: no  Context: stable Recurrent sinusitis: no   Relevant past medical, surgical, family and social history reviewed and updated as indicated. Interim medical history since our last visit reviewed. Allergies and medications reviewed and updated.  Review of Systems  Constitutional:  Negative for fatigue and fever.  HENT:  Positive for congestion, postnasal drip, rhinorrhea and sinus pressure. Negative for dental problem, ear pain, sinus pain, sneezing and sore throat.   Respiratory:  Positive for cough. Negative for shortness of breath and wheezing.   Cardiovascular:  Negative for chest pain.  Gastrointestinal:  Negative for vomiting.  Skin:  Negative for rash.  Neurological:  Positive for headaches.   Per HPI unless specifically indicated above     Objective:    LMP  (LMP Unknown)   Wt Readings from Last 3 Encounters:  10/23/20 224 lb (101.6 kg)  09/13/20 223 lb 9.6 oz (101.4 kg)  06/22/20 217 lb 3.2 oz (98.5 kg)    Physical Exam Vitals and nursing note reviewed.  Pulmonary:     Effort: Pulmonary  effort is normal. No respiratory distress.  Neurological:     Mental Status: She is alert.  Psychiatric:        Mood and Affect: Mood normal.        Behavior: Behavior normal.        Thought Content: Thought content normal.        Judgment: Judgment normal.    Results for orders placed or performed in visit on 03/26/21  VITAMIN D 25 Hydroxy (Vit-D Deficiency, Fractures)  Result Value Ref Range   Vit D, 25-Hydroxy 35.6 30.0 - 100.0 ng/mL  Lipid Panel w/o Chol/HDL Ratio  Result Value Ref Range   Cholesterol, Total 167 100 - 199 mg/dL   Triglycerides 244 (H) 0 - 149 mg/dL   HDL 53 >39 mg/dL   VLDL Cholesterol Cal 40 5 - 40 mg/dL   LDL Chol Calc (NIH) 74 0 - 99 mg/dL  Bayer DCA Hb A1c Waived  Result Value Ref Range   HB A1C (BAYER DCA - WAIVED) 5.8 <7.0 %      Assessment & Plan:   Problem List Items Addressed This Visit   None Visit Diagnoses     COVID-19    -  Primary   Molnupiravir sent to the pharmacy for patient. Discussed proper use. Reviewed s/s to monitor for and when to seek higher level of care. Reviewed quarantine.   Relevant Medications   molnupiravir EUA 200 mg CAPS        Follow up plan: Return if symptoms worsen or fail  to improve.      This visit was completed via MyChart due to the restrictions of the COVID-19 pandemic. All issues as above were discussed and addressed. Physical exam was done as above through visual confirmation on MyChart. If it was felt that the patient should be evaluated in the office, they were directed there. The patient verbally consented to this visit. Location of the patient: Home Location of the provider: Office Those involved with this call:  Provider: Jon Billings, NP CMA: Tiffany Reel, CMA Front Desk/Registration: Roe Rutherford This encounter was conducted via Phone.  I spent 15 dedicated to the care of this patient on the date of this encounter to include previsit review of 21, face to face time with the patient, and  post visit ordering of testing.

## 2021-07-30 ENCOUNTER — Telehealth: Payer: Self-pay | Admitting: Nurse Practitioner

## 2021-07-30 NOTE — Telephone Encounter (Signed)
Patient made aware of results and verbalized understanding.  

## 2021-07-30 NOTE — Telephone Encounter (Signed)
[name] her caregiver wants to know if patient can discontinue the Gabapentin.   Pt does not think she needs it anymore  CB#  [phone]

## 2021-08-07 ENCOUNTER — Other Ambulatory Visit: Payer: Self-pay

## 2021-08-07 ENCOUNTER — Telehealth (HOSPITAL_BASED_OUTPATIENT_CLINIC_OR_DEPARTMENT_OTHER): Payer: Medicare Other | Admitting: Psychiatry

## 2021-08-07 DIAGNOSIS — F259 Schizoaffective disorder, unspecified: Secondary | ICD-10-CM

## 2021-08-07 DIAGNOSIS — F819 Developmental disorder of scholastic skills, unspecified: Secondary | ICD-10-CM | POA: Diagnosis not present

## 2021-08-07 MED ORDER — CLONAZEPAM 0.5 MG PO TABS: 0.5000 mg | ORAL_TABLET | Freq: Two times a day (BID) | ORAL | 5 refills | Status: DC

## 2021-08-07 MED ORDER — QUETIAPINE FUMARATE 100 MG PO TABS: 100.0000 mg | ORAL_TABLET | Freq: Every day | ORAL | 5 refills | Status: DC

## 2021-08-07 MED ORDER — DULOXETINE HCL 30 MG PO CPEP: 30.0000 mg | ORAL_CAPSULE | Freq: Every day | ORAL | 5 refills | Status: DC

## 2021-08-07 MED ORDER — DULOXETINE HCL 60 MG PO CPEP: ORAL_CAPSULE | ORAL | 6 refills | Status: DC

## 2021-08-07 MED ORDER — TRAZODONE HCL 150 MG PO TABS: ORAL_TABLET | ORAL | 5 refills | Status: DC

## 2021-08-07 NOTE — Progress Notes (Signed)
Virtual Visit via Telephone Note  I connected with Rita [name] on 08/07/21 at  1:00 PM EDT by telephone and verified that I am speaking with the correct person using two identifiers.  Location: Patient: Home Provider: Hospital   I discussed the limitations, risks, security and privacy concerns of performing an evaluation and management service by telephone and the availability of in person appointments. I also discussed with the patient that there may be a patient responsible charge related to this service. The patient expressed understanding and agreed to proceed.   History of Present Illness: Patient was reached by telephone.  Established her identity and my own.  Spoke with the manager of the group home.  No new complaints.  Mood fine.  No reports of psychotic symptoms.  No reports of suicidal ideation.  Sleeping adequately.  No new health issues.    Observations/Objective: Appropriate interaction.  Euthymic.  No evidence psychosis or thought disorder.  Nerves under good control.   Assessment and Plan: No change to medication or treatment plan.  Everything renewed for 6 months.   Follow Up Instructions:    I discussed the assessment and treatment plan with the patient. The patient was provided an opportunity to ask questions and all were answered. The patient agreed with the plan and demonstrated an understanding of the instructions.   The patient was advised to call back or seek an in-person evaluation if the symptoms worsen or if the condition fails to improve as anticipated.  I provided 10 minutes of non-face-to-face time during this encounter.   Alethia Berthold, MD

## 2021-08-09 ENCOUNTER — Other Ambulatory Visit: Payer: Self-pay | Admitting: Nurse Practitioner

## 2021-08-10 NOTE — Telephone Encounter (Signed)
Requested medications are due for refill today yes  Requested medications are on the active medication list yes  Last refill 07/11/21 for 30 day  Last visit 03/20/21  Future visit scheduled 09/19/21  Notes to clinic Not Delegated.

## 2021-08-21 DIAGNOSIS — N1832 Chronic kidney disease, stage 3b: Secondary | ICD-10-CM | POA: Diagnosis not present

## 2021-08-21 DIAGNOSIS — I10 Essential (primary) hypertension: Secondary | ICD-10-CM | POA: Diagnosis not present

## 2021-08-21 DIAGNOSIS — N2581 Secondary hyperparathyroidism of renal origin: Secondary | ICD-10-CM | POA: Diagnosis not present

## 2021-09-04 ENCOUNTER — Other Ambulatory Visit: Payer: Self-pay | Admitting: Nurse Practitioner

## 2021-09-04 NOTE — Telephone Encounter (Signed)
Requested medication (s) are due for refill today: yes  Requested medication (s) are on the active medication list: yes  Last refill:  multivitamin: 07/11/21 #30   omeprazole: 08/10/21 #90  Future visit scheduled: yes  Notes to clinic:  no protocol for these 2 meds   Requested Prescriptions  Pending Prescriptions Disp Refills   Multiple Vitamin (TAB-A-VITE) TABS [Pharmacy Med Name: MULTIVITAMIN TABLET] 30 tablet     Sig: TAKE 1 TABLET BY MOUTH ONCE DAILY.     There is no refill protocol information for this order     omeprazole (PRILOSEC) 20 MG capsule [Pharmacy Med Name: OMEPRAZOLE DR 20 MG CAPSULE] 30 capsule     Sig: TAKE (1) CAPSULE BY MOUTH ONCE DAILY.     Gastroenterology: Proton Pump Inhibitors Passed - 09/04/2021  9:17 AM      Passed - Valid encounter within last 12 months    Recent Outpatient Visits           1 month ago [place], Santiago Glad, NP   5 months ago Schizoaffective disorder, bipolar type ([place])   [place] Cannady, Jolene T, NP   11 months ago Schizoaffective disorder, bipolar type ([place])   [place], Henrine Screws T, NP   1 year ago Acute right-sided low back pain with right-sided sciatica   [place], [place] T, NP   1 year ago Mixed hyperlipidemia   [place], Barbaraann Faster, NP       Future Appointments             In 2 weeks Cannady, Barbaraann Faster, NP [facility]   In 1 month  [facility]            Signed Prescriptions Disp Refills   rosuvastatin (CRESTOR) 40 MG tablet 9011 tablet 1    Sig: TAKE 1 TABLET BY MOUTH ONCE DAILY FOR CHOLESTEROL     Cardiovascular:  Antilipid - Statins Failed - 09/04/2021  9:17 AM      Failed - Triglycerides in normal range and within 360 days    Triglycerides  Date Value Ref Range Status  03/26/2021 244 (H) 0 - 149 mg/dL Final  09/30/2012 136 0 - 200 mg/dL Final    Triglycerides Piccolo,Waived  Date Value Ref Range Status  04/25/2020 310 (H) <150 mg/dL Final    Comment:                            Normal                   <150                         Borderline High     150 - 199                         High                200 - 499                         Very High                >499           Passed - Total Cholesterol in normal range and within 360  days    Cholesterol, Total  Date Value Ref Range Status  03/26/2021 167 100 - 199 mg/dL Final   Cholesterol  Date Value Ref Range Status  09/30/2012 189 0 - 200 mg/dL Final   Cholesterol Piccolo, Waived  Date Value Ref Range Status  04/25/2020 189 <200 mg/dL Final    Comment:                            Desirable                <200                         Borderline High      200- 239                         High                     >239           Passed - LDL in normal range and within 360 days    Ldl Cholesterol, Calc  Date Value Ref Range Status  09/30/2012 101 (H) 0 - 100 mg/dL Final   LDL Chol Calc (NIH)  Date Value Ref Range Status  03/26/2021 74 0 - 99 mg/dL Final   LDL Direct  Date Value Ref Range Status  07/14/2018 129 (H) 0 - 99 mg/dL Final          Passed - HDL in normal range and within 360 days    HDL Cholesterol  Date Value Ref Range Status  09/30/2012 61 (H) 40 - 60 mg/dL Final   HDL  Date Value Ref Range Status  03/26/2021 53 >39 mg/dL Final          Passed - Patient is not pregnant      Passed - Valid encounter within last 12 months    Recent Outpatient Visits           1 month ago [place], Santiago Glad, NP   5 months ago Schizoaffective disorder, bipolar type ([place])   [place], Jolene T, NP   11 months ago Schizoaffective disorder, bipolar type ([place])   [place], Jolene T, NP   1 year ago Acute right-sided low back pain with right-sided sciatica   [place], Barbaraann Faster, NP   1 year ago Mixed hyperlipidemia   [place], Barbaraann Faster, NP       Future Appointments             In 2 weeks Cannady, Barbaraann Faster, NP [facility]   In 1 month  [facility]

## 2021-09-19 ENCOUNTER — Encounter: Payer: Medicare Other | Admitting: Nurse Practitioner

## 2021-09-27 ENCOUNTER — Other Ambulatory Visit: Payer: Self-pay | Admitting: Nurse Practitioner

## 2021-09-27 DIAGNOSIS — K5909 Other constipation: Secondary | ICD-10-CM

## 2021-09-27 DIAGNOSIS — R059 Cough, unspecified: Secondary | ICD-10-CM

## 2021-09-28 NOTE — Telephone Encounter (Signed)
Requested Prescriptions  Pending Prescriptions Disp Refills  . docusate sodium (COLACE) 100 MG capsule [Pharmacy Med Name: DOCUSATE SODIUM 100 MG CAP] 60 capsule 0    Sig: TAKE (1) CAPSULE BY MOUTH TWICE DAILY AS NEEDED.     Over the Counter:  OTC Passed - 09/27/2021  1:35 PM      Passed - Valid encounter within last 12 months    Recent Outpatient Visits          2 months ago [place], Santiago Glad, NP   6 months ago Schizoaffective disorder, bipolar type ([place])   [place], Barbaraann Faster, NP   1 year ago Schizoaffective disorder, bipolar type ([place])   [place], Jolene T, NP   1 year ago Acute right-sided low back pain with right-sided sciatica   [place], Barbaraann Faster, NP   1 year ago Mixed hyperlipidemia   [place], Barbaraann Faster, NP      Future Appointments            In 3 weeks  [place], [place]   In 1 month Cannady, Barbaraann Faster, NP [facility]           . ACETAMINOPHEN EXTRA STRENGTH 500 MG tablet [Pharmacy Med Name: ACETAMINOPHEN 500 MG TABLET] 240 tablet 0    Sig: TAKE (2) TABLETS BY MOUTH EVERY SIX HOURS AS NEEDED.     Over the Counter:  OTC Passed - 09/27/2021  1:35 PM      Passed - Valid encounter within last 12 months    Recent Outpatient Visits          2 months ago [place], Santiago Glad, NP   6 months ago Schizoaffective disorder, bipolar type ([place])   [place], Barbaraann Faster, NP   1 year ago Schizoaffective disorder, bipolar type ([place])   [place], Jolene T, NP   1 year ago Acute right-sided low back pain with right-sided sciatica   [place], Barbaraann Faster, NP   1 year ago Mixed hyperlipidemia   [place], Barbaraann Faster, NP      Future Appointments            In 3 weeks  [place], [place]   In  1 month Cannady, Barbaraann Faster, NP [facility]           . GNP COUGH DM ER 30 MG/5ML liquid [Pharmacy Med Name: COUGH DM ER 30 MG/5 ML SUSP] 30 mL 0    Sig: GIVE 2.5MLS BY MOUTH AS NEEDED FOR COUGH.     Ear, Nose, and Throat:  Antitussives/Expectorants Passed - 09/27/2021  1:35 PM      Passed - Valid encounter within last 12 months    Recent Outpatient Visits          2 months ago [place], Santiago Glad, NP   6 months ago Schizoaffective disorder, bipolar type ([place])   [place], Barbaraann Faster, NP   1 year ago Schizoaffective disorder, bipolar type ([place])   [place], Jolene T, NP   1 year ago Acute right-sided low back pain with right-sided sciatica   [place], Barbaraann Faster, NP   1 year ago Mixed hyperlipidemia   [facility] [place], Barbaraann Faster, NP  Future Appointments            In 3 weeks  [place], [place]   In 1 month Cannady, Barbaraann Faster, NP [facility]

## 2021-10-24 ENCOUNTER — Ambulatory Visit (INDEPENDENT_AMBULATORY_CARE_PROVIDER_SITE_OTHER): Payer: Medicare Other | Admitting: *Deleted

## 2021-10-24 DIAGNOSIS — Z0000 Encounter for general adult medical examination without abnormal findings: Secondary | ICD-10-CM

## 2021-10-24 NOTE — Progress Notes (Signed)
Subjective:   Rita [name] is a 60 y.o. female who presents for Medicare Annual (Subsequent) preventive examination.  I connected with  EMBERLEE SORTINO on 10/24/21 by a telephone enabled telemedicine application and verified that I am speaking with the correct person using two identifiers.   I discussed the limitations of evaluation and management by telemedicine. The patient expressed understanding and agreed to proceed.  Patient location: home  Provider location:  Tele-Health  not in office   Review of Systems     Cardiac Risk Factors include: advanced age (>57men, >39 women);hypertension;obesity (BMI >30kg/m2)     Objective:    Today's Vitals   There is no height or weight on file to calculate BMI.  Advanced Directives 10/24/2021 10/23/2020 10/20/2019 07/02/2018 07/02/2017 07/15/2016 11/15/2015  Does Patient Have a Medical Advance Directive? Yes Yes Yes Yes Yes Yes Yes  Type of Paramedic of [place];Living will Living will;Healthcare Power of [place];Living will [place];Living will [place];Living will [place];Living will  Does patient want to make changes to medical advance directive? - - - - - - No - Patient declined  Copy of [place] in Chart? Yes - validated most recent copy scanned in chart (See row information) No - copy requested Yes - validated most recent copy scanned in chart (See row information) Yes Yes - No - copy requested  Would patient like information on creating a medical advance directive? - - - - - - -  Some encounter information is confidential and restricted. Go to Review Flowsheets activity to see all data.    Current Medications (verified) Outpatient Encounter Medications as of 10/24/2021  Medication Sig   ACETAMINOPHEN EXTRA STRENGTH 500 MG tablet TAKE (2) TABLETS BY MOUTH EVERY SIX HOURS AS  NEEDED.   aspirin 81 MG chewable tablet TAKE 1 TABLET BY MOUTH ONCE DAILY.   carvedilol (COREG) 6.25 MG tablet Take 6.25 mg by mouth 2 (two) times daily.   cholecalciferol (VITAMIN D) 25 MCG (1000 UNIT) tablet TAKE (2) TABLETS BY MOUTH ONCE DAILY.   clonazePAM (KLONOPIN) 0.5 MG tablet Take 1 tablet (0.5 mg total) by mouth 2 (two) times daily.   docusate sodium (COLACE) 100 MG capsule TAKE (1) CAPSULE BY MOUTH TWICE DAILY AS NEEDED.   DULoxetine (CYMBALTA) 30 MG capsule Take 1 capsule (30 mg total) by mouth at bedtime.   DULoxetine (CYMBALTA) 60 MG capsule TAKE (1) CAPSULE BY MOUTH ONCE DAILY.   GNP COUGH DM ER 30 MG/5ML liquid GIVE 2.5MLS BY MOUTH AS NEEDED FOR COUGH.   lisinopril (ZESTRIL) 5 MG tablet Take 1 tablet (5 mg total) by mouth daily.   magnesium oxide (MAG-OX) 400 (241.3 Mg) MG tablet TAKE 1 TABLET BY MOUTH ONCE DAILY.   Multiple Vitamin (TAB-A-VITE) TABS TAKE 1 TABLET BY MOUTH ONCE DAILY.   omeprazole (PRILOSEC) 20 MG capsule TAKE (1) CAPSULE BY MOUTH ONCE DAILY.   QUEtiapine (SEROQUEL) 100 MG tablet Take 1 tablet (100 mg total) by mouth at bedtime.   SUMAtriptan (IMITREX) 100 MG tablet Take by mouth.   traZODone (DESYREL) 150 MG tablet TAKE (1) TABLET BY MOUTH DAILY AT BEDTIME.   gabapentin (NEURONTIN) 300 MG capsule TAKE 1 CAPSULE BY MOUTH AT BEDTIME (Patient not taking: Reported on 10/24/2021)   rosuvastatin (CRESTOR) 40 MG tablet TAKE 1 TABLET BY MOUTH ONCE DAILY FOR CHOLESTEROL   No facility-administered encounter medications on file  as of 10/24/2021.    Allergies (verified) Advil [ibuprofen]   History: Past Medical History:  Diagnosis Date   Adenomatous polyps    Anxiety    Chronic constipation    Chronic tension headaches    CKD (chronic kidney disease) stage 3, GFR 30-59 ml/min (HCC)    Depression    Factitious disorder    GERD (gastroesophageal reflux disease)    Hyperlipidemia    Hypertension    Intellectual disability due to developmental disorder,  unspecified    Mental retardation    Microscopic hematuria    Migraine    Obesity    Past Surgical History:  Procedure Laterality Date   CARPAL TUNNEL RELEASE     COLONOSCOPY WITH PROPOFOL N/A 06/27/2017   Procedure: COLONOSCOPY WITH PROPOFOL;  Surgeon: Manya Silvas, MD;  Location: [facility] ENDOSCOPY;  Service: Endoscopy;  Laterality: N/A;   CYSTOSCOPY W/ RETROGRADES Bilateral 11/15/2015   Procedure: CYSTOSCOPY WITH RETROGRADE PYELOGRAM;  Surgeon: Hollice Espy, MD;  Location: [facility] ORS;  Service: Urology;  Laterality: Bilateral;   US ECHOCARDIOGRAPHY  2012   showed mild heart failure (LVEF 52%); small pericardial effusion   Family History  Problem Relation Age of Onset   Heart disease Father    Hyperlipidemia Father    Kidney disease Father    Breast cancer Maternal Aunt    Prostate cancer Neg Hx    Social History   Socioeconomic History   Marital status: Single    Spouse name: Not on file   Number of children: Not on file   Years of education: Not on file   Highest education level: High school graduate  Occupational History   Occupation: student  Tobacco Use   Smoking status: Never   Smokeless tobacco: Never  Vaping Use   Vaping Use: Never used  Substance and Sexual Activity   Alcohol use: No   Drug use: No   Sexual activity: Never  Other Topics Concern   Not on file  Social History Narrative   Goes to [employer]   Social Determinants of Health   Financial Resource Strain: Low Risk    Difficulty of Paying Living Expenses: Not hard at all  Food Insecurity: No Food Insecurity   Worried About Charity fundraiser in the Last Year: Never true   Arboriculturist in the Last Year: Never true  Transportation Needs: No Transportation Needs   Lack of Transportation (Medical): No   Lack of Transportation (Non-Medical): No  Physical Activity: Insufficiently Active   Days of Exercise per Week: 3 days   Minutes of Exercise per Session: 30 min  Stress: No Stress Concern  Present   Feeling of Stress : Only a little  Social Connections: Unknown   Frequency of Communication with Friends and Family: More than three times a week   Frequency of Social Gatherings with Friends and Family: More than three times a week   Attends Religious Services: More than 4 times per year   Active Member of [employer] or Organizations: Yes   Attends Music therapist: Not on file   Marital Status: Not on file    Tobacco Counseling Counseling given: Not Answered   Clinical Intake:  Pre-visit preparation completed: Yes  Pain : No/denies pain     Nutritional Risks: None  How often do you need to have someone help you when you read instructions, pamphlets, or other written materials from your doctor or pharmacy?: 5 - Always  Diabetic?no  Interpreter Needed?:  No  Information entered by :: Leroy Kennedy LPN   Activities of Daily Living In your present state of health, do you have any difficulty performing the following activities: 10/24/2021  Hearing? N  Vision? N  Difficulty concentrating or making decisions? Y  Walking or climbing stairs? N  Dressing or bathing? N  Preparing Food and eating ? Y  Using the Toilet? N  In the past six months, have you accidently leaked urine? Y  Comment wears pad  Do you have problems with loss of bowel control? N  Managing your Medications? Y  Managing your Finances? Y  Housekeeping or managing your Housekeeping? Y  Some recent data might be hidden    Patient Care Team: Venita Lick, NP as PCP - General (Nurse Practitioner) Kathrine Haddock, NP as PCP - Family Medicine (Nurse Practitioner) Anabel Bene, MD as Referring Physician (Neurology) Clapacs, Madie Reno, MD (Psychiatry) Anthonette Legato, MD (Internal Medicine)  Indicate any recent Medical Services you may have received from other than [facility] providers in the past year (date may be approximate).     Assessment:   This is a routine wellness examination for  [place].  Hearing/Vision screen Hearing Screening - Comments:: No trouble hearing Vision Screening - Comments:: [facility]  Dietary issues and exercise activities discussed: Current Exercise Habits: Home exercise routine, Type of exercise: walking, Time (Minutes): 30, Frequency (Times/Week): 3, Weekly Exercise (Minutes/Week): 90   Goals Addressed   None    Depression Screen PHQ 2/9 Scores 10/24/2021 03/20/2021 10/23/2020 09/13/2020 10/20/2019 07/14/2018 07/02/2018  PHQ - 2 Score 0 0 0 0 2 1 2   PHQ- 9 Score - 0 0 0 5 8 6     Fall Risk Fall Risk  10/24/2021 10/23/2020 10/20/2019 07/02/2018 01/09/2018  Falls in the past year? 0 0 0 No No  Number falls in past yr: 0 - 0 - -  Injury with Fall? 0 - 0 - -  Risk for fall due to : - Medication side effect - - -  Follow up Falls prevention discussed;Falls evaluation completed Falls evaluation completed;Education provided;Falls prevention discussed - - -    FALL RISK PREVENTION PERTAINING TO THE HOME:  Any stairs in or around the home? No  If so, are there any without handrails? No  Home free of loose throw rugs in walkways, pet beds, electrical cords, etc? Yes  Adequate lighting in your home to reduce risk of falls? Yes   ASSISTIVE DEVICES UTILIZED TO PREVENT FALLS:  Life alert? No  Use of a cane, walker or w/c? No  Grab bars in the bathroom? Yes  Shower chair or bench in shower? No  Elevated toilet seat or a handicapped toilet? Yes   TIMED UP AND GO:  Was the test performed? No .    Cognitive Function:  Did not perform:  Ms. Aram Candela caregiver answered most questions for patient       6CIT Screen 10/23/2020 07/02/2018 07/02/2017  What Year? 0 points 0 points 0 points  What month? 0 points 0 points 0 points  What time? 3 points 0 points 0 points  Count back from 20 0 points 0 points 2 points  Months in reverse 0 points 0 points 0 points  Repeat phrase 4 points 0 points 2 points  Total Score 7 0 4     Immunizations Immunization History  Administered Date(s) Administered   Influenza,inj,Quad PF,6+ Mos 09/20/2015, 10/14/2016, 09/26/2017, 10/02/2018, 08/10/2019, 09/13/2020   Influenza-Unspecified 08/26/2014, 10/01/2021   Moderna  Sars-Covid-2 Vaccination 12/17/2019, 01/14/2020, 10/02/2020, 10/01/2021   Td 10/20/2008, 03/26/2021    TDAP status: Up to date  Flu Vaccine status: Up to date  Pneumococcal vaccine status: Due, Education has been provided regarding the importance of this vaccine. Advised may receive this vaccine at local pharmacy or Health Dept. Aware to provide a copy of the vaccination record if obtained from local pharmacy or Health Dept. Verbalized acceptance and understanding.  Covid-19 vaccine status: Completed vaccines  Qualifies for Shingles Vaccine? Yes   Zostavax completed No   Shingrix Completed?: No.    Education has been provided regarding the importance of this vaccine. Patient has been advised to call insurance company to determine out of pocket expense if they have not yet received this vaccine. Advised may also receive vaccine at local pharmacy or Health Dept. Verbalized acceptance and understanding.  Screening Tests Health Maintenance  Topic Date Due   Zoster Vaccines- Shingrix (1 of 2) Never done   PAP SMEAR-Modifier  02/22/2019   COVID-19 Vaccine (5 - Booster for Moderna series) 11/26/2021   COLONOSCOPY (Pts 45-29yrs Insurance coverage will need to be confirmed)  06/27/2022   MAMMOGRAM  06/15/2023   TETANUS/TDAP  03/27/2031   INFLUENZA VACCINE  Completed   Hepatitis C Screening  Completed   HIV Screening  Completed   Pneumococcal Vaccine 64-62 Years old  Aged Out   HPV [place] Maintenance Due  Topic Date Due   Zoster Vaccines- Shingrix (1 of 2) Never done   PAP SMEAR-Modifier  02/22/2019    Colorectal cancer screening: Type of screening: Colonoscopy. Completed 2018. Repeat every 5  years  Mammogram status: Completed 2022. Repeat every year  Bone Density:   Caregiver will speak to sister before referral is placed  Lung Cancer Screening: (Low Dose CT Chest recommended if Age 20-80 years, 30 pack-year currently smoking OR have quit w/in 15years.) does not qualify.   Lung Cancer Screening Referral:   Additional Screening:  Hepatitis C Screening: does not qualify; Completed 2022  Vision Screening: Recommended annual ophthalmology exams for early detection of glaucoma and other disorders of the eye. Is the patient up to date with their annual eye exam?  Yes  Who is the provider or what is the name of the office in which the patient attends annual eye exams? [facility] If pt is not established with a provider, would they like to be referred to a provider to establish care? No .   Dental Screening: Recommended annual dental exams for proper oral hygiene  Community Resource Referral / Chronic Care Management: CRR required this visit?  No   CCM required this visit?  No      Plan:     I have personally reviewed and noted the following in the patient's chart:   Medical and social history Use of alcohol, tobacco or illicit drugs  Current medications and supplements including opioid prescriptions.  Functional ability and status Nutritional status Physical activity Advanced directives List of other physicians Hospitalizations, surgeries, and ER visits in previous 12 months Vitals Screenings to include cognitive, depression, and falls Referrals and appointments  In addition, I have reviewed and discussed with patient certain preventive protocols, quality metrics, and best practice recommendations. A written personalized care plan for preventive services as well as general preventive health recommendations were provided to patient.     Leroy Kennedy, LPN   [id number]   Nurse Notes: Care Giver Ms. Talley helped with  visit

## 2021-10-31 ENCOUNTER — Other Ambulatory Visit: Payer: Self-pay | Admitting: Nurse Practitioner

## 2021-11-01 NOTE — Telephone Encounter (Signed)
Requested medications are due for refill today.  yes  Requested medications are on the active medications list.  yes  Last refill. 12/04/2020  Future visit scheduled.   yes  Notes to clinic.  Failed protocol d/t expired labs.

## 2021-11-01 NOTE — Telephone Encounter (Signed)
Requested Prescriptions  Pending Prescriptions Disp Refills  . aspirin 81 MG chewable tablet [Pharmacy Med Name: ASPIRIN 81 MG CHEWABLE TABLET] 30 tablet 0    Sig: TAKE 1 TABLET BY MOUTH ONCE DAILY.     Analgesics:  NSAIDS - aspirin Passed - 10/31/2021  9:02 AM      Passed - Patient is not pregnant      Passed - Valid encounter within last 12 months    Recent Outpatient Visits          3 months ago [place], Santiago Glad, NP   7 months ago Schizoaffective disorder, bipolar type ([place])   [place], Barbaraann Faster, NP   1 year ago Schizoaffective disorder, bipolar type ([place])   [place], Jolene T, NP   1 year ago Acute right-sided low back pain with right-sided sciatica   [place], Barbaraann Faster, NP   1 year ago Mixed hyperlipidemia   [place], Barbaraann Faster, NP      Future Appointments            In 2 weeks Cannady, Barbaraann Faster, NP [facility]           . magnesium oxide (MAG-OX) 400 (240 Mg) MG tablet [Pharmacy Med Name: MAGNESIUM OXIDE 400 MG TABLET] 30 tablet 0    Sig: TAKE 1 TABLET BY MOUTH ONCE DAILY.     Endocrinology:  Minerals - Magnesium Supplementation Failed - 10/31/2021  9:02 AM      Failed - Mg Level in normal range and within 360 days    Magnesium  Date Value Ref Range Status  12/28/2013 1.7 (L) mg/dL Final    Comment:    1.8-2.4 THERAPEUTIC RANGE: 4-7 mg/dL TOXIC: > 10 mg/dL  -----------------------          Passed - Valid encounter within last 12 months    Recent Outpatient Visits          3 months ago [place], NP   7 months ago Schizoaffective disorder, bipolar type ([place])   [place], Barbaraann Faster, NP   1 year ago Schizoaffective disorder, bipolar type ([place])   [place], Jolene T, NP   1 year ago Acute right-sided low back pain with  right-sided sciatica   [place], Barbaraann Faster, NP   1 year ago Mixed hyperlipidemia   [place], Barbaraann Faster, NP      Future Appointments            In 2 weeks Cannady, Barbaraann Faster, NP [facility]

## 2021-11-02 ENCOUNTER — Other Ambulatory Visit: Payer: Self-pay | Admitting: Nurse Practitioner

## 2021-11-02 MED ORDER — MAGNESIUM OXIDE -MG SUPPLEMENT 400 (240 MG) MG PO TABS: 1.0000 | ORAL_TABLET | Freq: Every day | ORAL | 4 refills | Status: DC

## 2021-11-07 DIAGNOSIS — H3554 Dystrophies primarily involving the retinal pigment epithelium: Secondary | ICD-10-CM | POA: Diagnosis not present

## 2021-11-15 ENCOUNTER — Encounter: Payer: Medicare Other | Admitting: Nurse Practitioner

## 2021-11-16 DIAGNOSIS — R519 Headache, unspecified: Secondary | ICD-10-CM | POA: Diagnosis not present

## 2021-12-25 ENCOUNTER — Other Ambulatory Visit: Payer: Self-pay | Admitting: Nurse Practitioner

## 2021-12-25 NOTE — Telephone Encounter (Signed)
Requested medication (s) are due for refill today: yes  Requested medication (s) are on the active medication list: yes  Last refill:  08/10/21 #60 4 RF  Future visit scheduled: yes  Notes to clinic:  med not delegated to NT to RF   Requested Prescriptions  Pending Prescriptions Disp Refills   cholecalciferol (VITAMIN D) 25 MCG (1000 UNIT) tablet [Pharmacy Med Name: VITAMIN D3 1,000 UNITS TAB] 60 tablet     Sig: TAKE (2) TABLETS BY MOUTH ONCE DAILY.     Endocrinology:  Vitamins - Vitamin D Supplementation Failed - 12/25/2021 10:13 AM      Failed - 50,000 IU strengths are not delegated      Failed - Ca in normal range and within 360 days    Calcium  Date Value Ref Range Status  09/13/2020 9.4 8.7 - 10.2 mg/dL Final   Calcium, Total  Date Value Ref Range Status  12/28/2013 8.6 8.5 - 10.1 mg/dL Final          Failed - Phosphate in normal range and within 360 days    No results found for: PHOS        Passed - Vitamin D in normal range and within 360 days    Vit D, 25-Hydroxy  Date Value Ref Range Status  03/26/2021 35.6 30.0 - 100.0 ng/mL Final    Comment:    Vitamin D deficiency has been defined by the Institute of Medicine and an Endocrine Society practice guideline as a level of serum 25-OH vitamin D less than 20 ng/mL (1,2). The Endocrine Society went on to further define vitamin D insufficiency as a level between 21 and 29 ng/mL (2). 1. IOM (Institute of Medicine). 2010. Dietary reference    intakes for calcium and D. [place]: The    [employer]. 2. Holick MF, Binkley [place], Bischoff-Ferrari HA, et al.    Evaluation, treatment, and prevention of vitamin D    deficiency: an Endocrine Society clinical practice    guideline. JCEM. 2011 Jul; 96(7):1911-30.           Passed - Valid encounter within last 12 months    Recent Outpatient Visits           5 months ago [place], NP   9 months ago  Schizoaffective disorder, bipolar type ([place])   [place], Barbaraann Faster, NP   1 year ago Schizoaffective disorder, bipolar type ([place])   [place], Jolene T, NP   1 year ago Acute right-sided low back pain with right-sided sciatica   [place], Barbaraann Faster, NP   1 year ago Mixed hyperlipidemia   [place], Barbaraann Faster, NP       Future Appointments             In 1 week Cann[place], Barbaraann Faster, NP [facility]            Signed Prescriptions Disp Refills   aspirin 81 MG chewable tablet 30 tablet 0    Sig: TAKE 1 TABLET BY MOUTH ONCE DAILY.     Analgesics:  NSAIDS - aspirin Passed - 12/25/2021 10:13 AM      Passed - Patient is not pregnant      Passed - Valid encounter within last 12 months    Recent Outpatient Visits           5 months ago COVID-19   [facility] Jon [place], NP   9 months ago Schizoaffective disorder, bipolar type [facility])   [place], Barbaraann Faster, NP   1 year ago Schizoaffective disorder, bipolar type ([place])   [place], [place] T, NP   1 year ago Acute right-sided low back pain with right-sided sciatica   [place], Barbaraann Faster, NP   1 year ago Mixed hyperlipidemia   [place], Barbaraann Faster, NP       Future Appointments             In 1 week Cannady, Barbaraann Faster, NP [facility]

## 2021-12-25 NOTE — Telephone Encounter (Signed)
Requested Prescriptions  Pending Prescriptions Disp Refills   aspirin 81 MG chewable tablet [Pharmacy Med Name: ASPIRIN 81 MG CHEWABLE TABLET] 30 tablet 0    Sig: TAKE 1 TABLET BY MOUTH ONCE DAILY.     Analgesics:  NSAIDS - aspirin Passed - 12/25/2021 10:13 AM      Passed - Patient is not pregnant      Passed - Valid encounter within last 12 months    Recent Outpatient Visits          5 months ago [place], NP   9 months ago Schizoaffective disorder, bipolar type ([place])   [place], Barbaraann Faster, NP   1 year ago Schizoaffective disorder, bipolar type ([place])   [place], Jolene T, NP   1 year ago Acute right-sided low back pain with right-sided sciatica   [place], Barbaraann Faster, NP   1 year ago Mixed hyperlipidemia   [place], Barbaraann Faster, NP      Future Appointments            In 1 week Cannady, Barbaraann Faster, NP [facility]            cholecalciferol (VITAMIN D) 25 MCG (1000 UNIT) tablet [Pharmacy Med Name: VITAMIN D3 1,000 UNITS TAB] 60 tablet     Sig: TAKE (2) TABLETS BY MOUTH ONCE DAILY.     Endocrinology:  Vitamins - Vitamin D Supplementation Failed - 12/25/2021 10:13 AM      Failed - 50,000 IU strengths are not delegated      Failed - Ca in normal range and within 360 days    Calcium  Date Value Ref Range Status  09/13/2020 9.4 8.7 - 10.2 mg/dL Final   Calcium, Total  Date Value Ref Range Status  12/28/2013 8.6 8.5 - 10.1 mg/dL Final         Failed - Phosphate in normal range and within 360 days    No results found for: PHOS       Passed - Vitamin D in normal range and within 360 days    Vit D, 25-Hydroxy  Date Value Ref Range Status  03/26/2021 35.6 30.0 - 100.0 ng/mL Final    Comment:    Vitamin D deficiency has been defined by the Institute of Medicine and an Endocrine Society practice guideline as a level of  serum 25-OH vitamin D less than 20 ng/mL (1,2). The Endocrine Society went on to further define vitamin D insufficiency as a level between 21 and 29 ng/mL (2). 1. IOM (Institute of Medicine). 2010. Dietary reference    intakes for calcium and D. [place]: The    [employer]. 2. Holick MF, Binkley [place], Bischoff-Ferrari HA, et al.    Evaluation, treatment, and prevention of vitamin D    deficiency: an Endocrine Society clinical practice    guideline. JCEM. 2011 Jul; 96(7):1911-30.          Passed - Valid encounter within last 12 months    Recent Outpatient Visits          5 months ago [place], [place], NP   9 months ago Schizoaffective disorder, bipolar type [facility])   [place], Barbaraann Faster, NP   1 year ago Schizoaffective disorder, bipolar type [facility])   [place], [place] T, NP   1 year ago Acute right-sided low back  pain with right-sided sciatica   [facility] [place], Barbaraann Faster, NP   1 year ago Mixed hyperlipidemia   [place], Barbaraann Faster, NP      Future Appointments            In 1 week Cannady, Barbaraann Faster, NP [facility]

## 2021-12-29 NOTE — Patient Instructions (Signed)

## 2022-01-01 ENCOUNTER — Other Ambulatory Visit: Payer: Self-pay

## 2022-01-01 ENCOUNTER — Ambulatory Visit (INDEPENDENT_AMBULATORY_CARE_PROVIDER_SITE_OTHER): Payer: 59 | Admitting: Nurse Practitioner

## 2022-01-01 ENCOUNTER — Encounter: Payer: Self-pay | Admitting: Nurse Practitioner

## 2022-01-01 VITALS — BP 111/73 | HR 76 | Temp 98.3°F | Ht 59.5 in | Wt 227.0 lb

## 2022-01-01 DIAGNOSIS — E210 Primary hyperparathyroidism: Secondary | ICD-10-CM

## 2022-01-01 DIAGNOSIS — I10 Essential (primary) hypertension: Secondary | ICD-10-CM

## 2022-01-01 DIAGNOSIS — Z0000 Encounter for general adult medical examination without abnormal findings: Secondary | ICD-10-CM

## 2022-01-01 DIAGNOSIS — R7301 Impaired fasting glucose: Secondary | ICD-10-CM

## 2022-01-01 DIAGNOSIS — R748 Abnormal levels of other serum enzymes: Secondary | ICD-10-CM

## 2022-01-01 DIAGNOSIS — E782 Mixed hyperlipidemia: Secondary | ICD-10-CM

## 2022-01-01 DIAGNOSIS — F250 Schizoaffective disorder, bipolar type: Secondary | ICD-10-CM

## 2022-01-01 DIAGNOSIS — G43009 Migraine without aura, not intractable, without status migrainosus: Secondary | ICD-10-CM

## 2022-01-01 DIAGNOSIS — N1832 Chronic kidney disease, stage 3b: Secondary | ICD-10-CM | POA: Diagnosis not present

## 2022-01-01 DIAGNOSIS — M25521 Pain in right elbow: Secondary | ICD-10-CM

## 2022-01-01 DIAGNOSIS — K219 Gastro-esophageal reflux disease without esophagitis: Secondary | ICD-10-CM

## 2022-01-01 DIAGNOSIS — K5909 Other constipation: Secondary | ICD-10-CM

## 2022-01-01 DIAGNOSIS — E538 Deficiency of other specified B group vitamins: Secondary | ICD-10-CM

## 2022-01-01 DIAGNOSIS — F819 Developmental disorder of scholastic skills, unspecified: Secondary | ICD-10-CM

## 2022-01-01 DIAGNOSIS — E559 Vitamin D deficiency, unspecified: Secondary | ICD-10-CM

## 2022-01-01 LAB — MICROALBUMIN, URINE WAIVED
Creatinine, Urine Waived: 200 mg/dL (ref 10–300)
Microalb, Ur Waived: 30 mg/L — ABNORMAL HIGH (ref 0–19)
Microalb/Creat Ratio: <30 mg/g (ref <30)

## 2022-01-01 LAB — BAYER DCA HB A1C WAIVED: HB A1C (BAYER DCA - WAIVED): 5.8 % — ABNORMAL HIGH (ref 4.8–5.6)

## 2022-01-01 MED ORDER — LISINOPRIL 5 MG PO TABS: 5.0000 mg | ORAL_TABLET | Freq: Every day | ORAL | 4 refills | Status: DC

## 2022-01-01 MED ORDER — DOCUSATE SODIUM 100 MG PO CAPS: ORAL_CAPSULE | ORAL | 4 refills | Status: DC

## 2022-01-01 MED ORDER — DICLOFENAC SODIUM 1 % EX GEL: 2.0000 g | Freq: Three times a day (TID) | CUTANEOUS | 4 refills | Status: DC

## 2022-01-01 MED ORDER — ROSUVASTATIN CALCIUM 40 MG PO TABS: 40.0000 mg | ORAL_TABLET | Freq: Every day | ORAL | 4 refills | Status: DC

## 2022-01-01 NOTE — Assessment & Plan Note (Signed)
Acute -- suspect golfers elbow.  At this time recommend Voltaren gel as needed + ice application to area.  Ensure plenty of rest periods when knitting, no prolonged sessions.  May take Tylenol as needed for pain.  Return if worsening or ongoing and may provide injection.

## 2022-01-01 NOTE — Assessment & Plan Note (Signed)
Recommend continued diet focus, cutting back on soda and drinking more water + weight loss.  A1c April 5.8%.  No current medications.  Recheck A1c and urine ALB today.

## 2022-01-01 NOTE — Assessment & Plan Note (Signed)
Chronic, stable.  Continue collaboration with nephrology and Lisinopril for kidney protection, refills sent and recent noted reviewed.  Recent CMP and labs with nephrology reviewed, will not repeat today.

## 2022-01-01 NOTE — Assessment & Plan Note (Signed)
Chronic, ongoing.  Continue current medication regimen and adjust as needed.  Lipid panel today with LFTs. 

## 2022-01-01 NOTE — Assessment & Plan Note (Signed)
Chronic, stable, followed by neurology. Continue this collaboration.  Recent notes reviewed -- continue medication as ordered by them. 

## 2022-01-01 NOTE — Progress Notes (Signed)
BP 111/73    Pulse 76    Temp 98.3 F (36.8 C) (Oral)    Ht 4' 11.5" (1.511 m)    Wt 227 lb (103 kg)    LMP  (LMP Unknown)    SpO2 98%    BMI 45.08 kg/m    Subjective:    Patient ID: Rita [name], female    DOB: [birth year], 61 y.o.   MRN: [id number]  HPI: Rita [name] is a 61 y.o. female presenting on 01/01/2022 for comprehensive medical examination. Current medical complaints include:none  She currently lives with: group home setting Menopausal Symptoms: no  Last saw neurology for migraine follow-up on 11/16/21. Continues on Imitrex -- she reports good control with this.  HYPERTENSION / HYPERLIPIDEMIA WITH CKD Last saw nephrology, Dr. Holley Raring, on 12/24/21, labs GFR 37, CRT 1.59, PTH 71.  Continues on Crestor, Lisinopril and Carvedilol + ASA. Satisfied with current treatment? 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   Duration: weeks Location: right Mechanism of injury: unknown Onset: gradual Severity: 8/10  Quality:  dull, aching, and throbbing Frequency: intermittent Radiation: no Aggravating factors: movement  Alleviating factors: nothing  Status: stable Treatments attempted: none  Relief with NSAIDs?:  No NSAIDs Taken Swelling: no Redness: no  Warmth: no Trauma: no Chest pain: no  Shortness of breath: no  Fever: no Decreased sensation: no Paresthesias: no Weakness: no   GERD Continues on Omeprazole  daily. GERD control status:  stable Satisfied with current treatment? yes Heartburn frequency: none Medication side effects: no  Medication compliance: stable Previous GERD medications: Dysphagia: no Odynophagia:  no Hematemesis: no Blood in stool: no EGD: no    IFG: No current medications, diet focused.  Recent A1c 5.8% April 2022. Taking Vitamin D daily for deficiency -- last level 35.6.  Continues to drink sodas when on campus -- has been instructed to drink more water by PCP and nephrology. Hypoglycemic episodes:no Polydipsia/polyuria: no Visual disturbance: no Chest pain: no Paresthesias: no    SCHIZOAFFECTIVE DISORDER Followed by psychiatry and last seen by Dr. Weber Cooks on 08/07/21.  She does have learning disabilities and lives in stable group home.  Does attend therapy once a month with Otila Kluver.  Caregiver is requesting a prescription for adult pull-ups  Patient uses these daily as needed for incontinence episodes if present.   Mood status: stable Satisfied with current treatment?: yes Symptom severity: mild  Duration of current treatment : chronic Side effects: no Medication compliance: good compliance Psychotherapy/counseling: none Depressed mood: no Anxious mood: no Anhedonia: no Significant weight loss or gain: no Insomnia: none Fatigue: no Feelings of worthlessness or guilt: no Impaired concentration/indecisiveness: no Suicidal ideations: no Hopelessness: no Crying spells: no Depression screen [facility] 2/9 01/01/2022 10/24/2021 03/20/2021 10/23/2020 09/13/2020  Decreased Interest 0 0 0 0 0  Down, Depressed, Hopeless 0 0 0 0 0  PHQ - 2 Score 0 0 0 0 0  Altered sleeping 1 - 0 0 0  Tired, decreased energy 1 - 0 0 0  Change in appetite 0 - 0 0  0  Feeling bad or failure about yourself  0 - 0 0 0  Trouble concentrating 0 - 0 0 0  Moving slowly or fidgety/restless 0 - 0 0 0  Suicidal thoughts 0 - 0 0 0  PHQ-9 Score 2 - 0 0 0  Difficult doing work/chores Not difficult at all - - - -  Some recent  data might be hidden   GAD 7 : Generalized Anxiety Score 01/01/2022 07/14/2018  Nervous, Anxious, on Edge 1 0  Control/stop worrying 1 1  Worry too much - different things 1 1  Trouble relaxing 2 1  Restless 0 0  Easily annoyed or irritable 0 0  Afraid - awful might happen 0 0  Total GAD 7 Score 5 3  Anxiety Difficulty Not difficult at all -     Fall Risk 07/02/2018 10/20/2019 10/23/2020 10/24/2021 01/01/2022  Falls in the past year? No 0 0 0 0  Was there an injury with Fall? - 0 - 0 0  Fall Risk Category Calculator - 0 - 0 0  Fall Risk Category - Low - Low Low  Patient Fall Risk Level - Low fall risk Low fall risk Low fall risk Low fall risk  Patient at Risk for Falls Due to - - Medication side effect - No Fall Risks  Fall risk Follow up - - Falls evaluation completed;Education provided;Falls prevention discussed Falls prevention discussed;Falls evaluation completed Falls evaluation completed    Functional Status Survey: Is the patient deaf or have difficulty hearing?: No Does the patient have difficulty seeing, even when wearing glasses/contacts?: No Does the patient have difficulty concentrating, remembering, or making decisions?: No Does the patient have difficulty walking or climbing stairs?: No Does the patient have difficulty dressing or bathing?: No Does the patient have difficulty doing errands alone such as visiting a doctor's office or shopping?: No   Past Medical History:  Past Medical History:  Diagnosis Date   Adenomatous polyps    Anxiety    Chronic constipation    Chronic tension headaches    CKD (chronic kidney disease) stage 3, GFR 30-59 ml/min (HCC)    Depression    Factitious disorder    GERD (gastroesophageal reflux disease)    Hyperlipidemia    Hypertension    Intellectual disability due to developmental disorder, unspecified    Mental retardation    Microscopic hematuria    Migraine    Obesity     Surgical History:  Past Surgical History:   Procedure Laterality Date   CARPAL TUNNEL RELEASE     COLONOSCOPY WITH PROPOFOL N/A 06/27/2017   Procedure: COLONOSCOPY WITH PROPOFOL;  Surgeon: Manya Silvas, MD;  Location: [facility] ENDOSCOPY;  Service: Endoscopy;  Laterality: N/A;   CYSTOSCOPY W/ RETROGRADES Bilateral 11/15/2015   Procedure: CYSTOSCOPY WITH RETROGRADE PYELOGRAM;  Surgeon: Hollice Espy, MD;  Location: [facility] ORS;  Service: Urology;  Laterality: Bilateral;   US ECHOCARDIOGRAPHY  2012   showed mild heart failure (LVEF 52%); small pericardial effusion    Medications:  Current Outpatient Medications on File Prior to Visit  Medication Sig   ACETAMINOPHEN EXTRA STRENGTH 500 MG tablet TAKE (2) TABLETS BY MOUTH EVERY SIX HOURS AS NEEDED.   aspirin 81 MG chewable tablet TAKE 1 TABLET BY MOUTH ONCE DAILY.   carvedilol (COREG) 6.25 MG tablet Take 6.25 mg by mouth 2 (two) times daily.   cholecalciferol (VITAMIN D) 25 MCG (1000 UNIT) tablet TAKE (2) TABLETS BY MOUTH ONCE DAILY.   clonazePAM (KLONOPIN) 0.5 MG  tablet Take 1 tablet by mouth 3 (three) times daily.   DULoxetine (CYMBALTA) 30 MG capsule Take 1 capsule (30 mg total) by mouth at bedtime.   DULoxetine (CYMBALTA) 60 MG capsule TAKE (1) CAPSULE BY MOUTH ONCE DAILY.   GNP COUGH DM ER 30 MG/5ML liquid GIVE 2.5MLS BY MOUTH AS NEEDED FOR COUGH.   magnesium oxide (MAG-OX) 400 (240 Mg) MG tablet Take 1 tablet (400 mg total) by mouth daily.   Multiple Vitamin (TAB-A-VITE) TABS TAKE 1 TABLET BY MOUTH ONCE DAILY.   omeprazole (PRILOSEC) 20 MG capsule TAKE (1) CAPSULE BY MOUTH ONCE DAILY.   QUEtiapine (SEROQUEL) 100 MG tablet Take 1 tablet (100 mg total) by mouth at bedtime.   SUMAtriptan (IMITREX) 100 MG tablet Take by mouth.   traZODone (DESYREL) 150 MG tablet TAKE (1) TABLET BY MOUTH DAILY AT BEDTIME.   No current facility-administered medications on file prior to visit.    Allergies:  Allergies  Allergen Reactions   Advil [Ibuprofen] Other (See Comments)    Reaction:  Gives  her kidney problems     Social History:  Social History   Socioeconomic History   Marital status: Single    Spouse name: Not on file   Number of children: Not on file   Years of education: Not on file   Highest education level: High school graduate  Occupational History   Occupation: student  Tobacco Use   Smoking status: Never   Smokeless tobacco: Never  Vaping Use   Vaping Use: Never used  Substance and Sexual Activity   Alcohol use: No   Drug use: No   Sexual activity: Never  Other Topics Concern   Not on file  Social History Narrative   Goes to [employer]   Social Determinants of Health   Financial Resource Strain: Low Risk    Difficulty of Paying Living Expenses: Not hard at all  Food Insecurity: No Food Insecurity   Worried About Charity fundraiser in the Last Year: Never true   Arboriculturist in the Last Year: Never true  Transportation Needs: No Transportation Needs   Lack of Transportation (Medical): No   Lack of Transportation (Non-Medical): No  Physical Activity: Insufficiently Active   Days of Exercise per Week: 3 days   Minutes of Exercise per Session: 30 min  Stress: No Stress Concern Present   Feeling of Stress : Only a little  Social Connections: Unknown   Frequency of Communication with Friends and Family: More than three times a week   Frequency of Social Gatherings with Friends and Family: More than three times a week   Attends Religious Services: More than 4 times per year   Active Member of [employer] or Organizations: Yes   Attends Music therapist: Not on file   Marital Status: Not on file  Intimate Partner Violence: Not At Risk   Fear of Current or Ex-Partner: No   Emotionally Abused: No   Physically Abused: No   Sexually Abused: No   Social History   Tobacco Use  Smoking Status Never  Smokeless Tobacco Never   Social History   Substance and Sexual Activity  Alcohol Use No    Family History:  Family History  Problem  Relation Age of Onset   Heart disease Father    Hyperlipidemia Father    Kidney disease Father    Breast cancer Maternal Aunt    Prostate cancer Neg Hx     Past medical history, surgical  history, medications, allergies, family history and social history reviewed with patient today and changes made to appropriate areas of the chart.   ROS All other ROS negative except what is listed above and in the HPI.      Objective:    BP 111/73    Pulse 76    Temp 98.3 F (36.8 C) (Oral)    Ht 4' 11.5" (1.511 m)    Wt 227 lb (103 kg)    LMP  (LMP Unknown)    SpO2 98%    BMI 45.08 kg/m   Wt Readings from Last 3 Encounters:  01/01/22 227 lb (103 kg)  10/23/20 224 lb (101.6 kg)  09/13/20 223 lb 9.6 oz (101.4 kg)    Physical Exam Vitals and nursing note reviewed. Exam conducted with a chaperone present.  Constitutional:      General: She is awake. She is not in acute distress.    Appearance: She is well-developed. She is not ill-appearing.  HENT:     Head: Normocephalic and atraumatic.     Right Ear: Hearing, tympanic membrane, ear canal and external ear normal. No drainage.     Left Ear: Hearing, tympanic membrane, ear canal and external ear normal. No drainage.     Nose: Nose normal.     Right Sinus: No maxillary sinus tenderness or frontal sinus tenderness.     Left Sinus: No maxillary sinus tenderness or frontal sinus tenderness.     Mouth/Throat:     Mouth: Mucous membranes are moist.     Pharynx: Oropharynx is clear. Uvula midline. No pharyngeal swelling, oropharyngeal exudate or posterior oropharyngeal erythema.  Eyes:     General: Lids are normal.        Right eye: No discharge.        Left eye: No discharge.     Extraocular Movements: Extraocular movements intact.     Conjunctiva/sclera: Conjunctivae normal.     Pupils: Pupils are equal, round, and reactive to light.     Visual Fields: Right eye visual fields normal and left eye visual fields normal.  Neck:     Thyroid: No  thyromegaly.     Vascular: No carotid bruit.     Trachea: Trachea normal.  Cardiovascular:     Rate and Rhythm: Normal rate and regular rhythm.     Heart sounds: Normal heart sounds. No murmur heard.   No gallop.  Pulmonary:     Effort: Pulmonary effort is normal. No accessory muscle usage or respiratory distress.     Breath sounds: Normal breath sounds.  Chest:     Comments: Deferred per patient request. Abdominal:     General: Bowel sounds are normal.     Palpations: Abdomen is soft. There is no hepatomegaly or splenomegaly.     Tenderness: There is no abdominal tenderness.  Musculoskeletal:        General: Normal range of motion.     Right elbow: No swelling, effusion or lacerations. Normal range of motion. Tenderness present in medial epicondyle.     Left elbow: Normal.     Cervical back: Normal range of motion and neck supple.     Right lower leg: No edema.     Left lower leg: No edema.  Lymphadenopathy:     Head:     Right side of head: No submental, submandibular, tonsillar, preauricular or posterior auricular adenopathy.     Left side of head: No submental, submandibular, tonsillar, preauricular or posterior auricular adenopathy.  Cervical: No cervical adenopathy.  Skin:    General: Skin is warm and dry.     Capillary Refill: Capillary refill takes less than 2 seconds.     Findings: No rash.  Neurological:     Mental Status: She is alert and oriented to person, place, and time.     Gait: Gait is intact.     Deep Tendon Reflexes: Reflexes are normal and symmetric.     Reflex Scores:      Brachioradialis reflexes are 2+ on the right side and 2+ on the left side.      Patellar reflexes are 2+ on the right side and 2+ on the left side. Psychiatric:        Attention and Perception: Attention normal.        Mood and Affect: Mood normal.        Speech: Speech normal.        Behavior: Behavior normal. Behavior is cooperative.        Thought Content: Thought content  normal.        Judgment: Judgment normal.    Results for orders placed or performed in visit on 03/26/21  VITAMIN D 25 Hydroxy (Vit-D Deficiency, Fractures)  Result Value Ref Range   Vit D, 25-Hydroxy 35.6 30.0 - 100.0 ng/mL  Lipid Panel w/o Chol/HDL Ratio  Result Value Ref Range   Cholesterol, Total 167 100 - 199 mg/dL   Triglycerides 244 (H) 0 - 149 mg/dL   HDL 53 >39 mg/dL   VLDL Cholesterol Cal 40 5 - 40 mg/dL   LDL Chol Calc (NIH) 74 0 - 99 mg/dL  Bayer DCA Hb A1c Waived  Result Value Ref Range   HB A1C (BAYER DCA - WAIVED) 5.8 <7.0 %      Assessment & Plan:   Problem List Items Addressed This Visit       Cardiovascular and Mediastinum   Essential hypertension    Chronic, stable with BP at goal in office today.  Continue current medication regimen and collaboration with nephrology.  Lisinopril for kidney protection, refills up to date.  Recommend checking BP at home on occasion and documenting.  Labs today: CMP and CBC up to date with nephrology -- obtain TSH, LFTs, and urine ALB today.   Return in 6 months.        Relevant Medications   lisinopril (ZESTRIL) 5 MG tablet   rosuvastatin (CRESTOR) 40 MG tablet   Other Relevant Orders   Microalbumin, Urine Waived   TSH   Headache, migraine    Chronic, stable, followed by neurology. Continue this collaboration.  Recent notes reviewed -- continue medication as ordered by them.      Relevant Medications   clonazePAM (KLONOPIN) 0.5 MG tablet   lisinopril (ZESTRIL) 5 MG tablet   rosuvastatin (CRESTOR) 40 MG tablet     Digestive   Chronic constipation    Chronic, ongoing, refills sent in on Colace.      Relevant Medications   docusate sodium (COLACE) 100 MG capsule   GERD (gastroesophageal reflux disease)    Chronic, ongoing.  Stable with Omeprazole, has tried reductions without success.  Continue current medication regimen and adjust as needed.  Mag level today.      Relevant Medications   docusate sodium (COLACE)  100 MG capsule   Other Relevant Orders   Magnesium     Endocrine   IFG (impaired fasting glucose)    Recommend continued diet focus, cutting back on soda and  drinking more water + weight loss.  A1c April 5.8%.  No current medications.  Recheck A1c and urine ALB today.      Relevant Orders   Bayer DCA Hb A1c Waived   Microalbumin, Urine Waived   Primary hyperparathyroidism ([place])    Ongoing per nephrology notes, with recent PTH level 71.  Continue collaboration with nephrology, recent note reviewed.  Recent CMP and labs with nephrology reviewed, will not repeat today.        Genitourinary   Chronic kidney disease, stage 3 (HCC)    Chronic, stable.  Continue collaboration with nephrology and Lisinopril for kidney protection, refills sent and recent noted reviewed.  Recent CMP and labs with nephrology reviewed, will not repeat today.      Relevant Orders   Microalbumin, Urine Waived     Other   Hyperlipidemia    Chronic, ongoing.  Continue current medication regimen and adjust as needed.  Lipid panel today with LFTs.      Relevant Medications   lisinopril (ZESTRIL) 5 MG tablet   rosuvastatin (CRESTOR) 40 MG tablet   Other Relevant Orders   Lipid Panel w/o Chol/HDL Ratio   Learning disability    Baseline, continue to monitor.  Lives in stable group home setting.  Will write script for adult pull-up briefs for patient when needed.      Morbid obesity (HCC)    BMI 45.08 last visit with HTN, CKD.  Recommended eating smaller high protein, low fat meals more frequently and exercising 30 mins a day 5 times a week with a goal of 10-15lb weight loss in the next 3 months. Patient voiced their understanding and motivation to adhere to these recommendations.       Right elbow pain    Acute -- suspect golfers elbow.  At this time recommend Voltaren gel as needed + ice application to area.  Ensure plenty of rest periods when knitting, no prolonged sessions.  May take Tylenol as needed for  pain.  Return if worsening or ongoing and may provide injection.      Schizoaffective disorder, bipolar type ([place]) - Primary    Chronic, stable, followed by psychiatry.  Continue current medication regimen as prescribed by them, recent note reviewed.  Denies SI/HI.      Vitamin D deficiency    Ongoing, continue supplement and check level today.      Relevant Orders   VITAMIN D 25 Hydroxy (Vit-D Deficiency, Fractures)   Other Visit Diagnoses     Elevated alkaline phosphatase level       Noted on past labs, recheck LFTs and GGT today.  May need imaging if increase.   Relevant Orders   Hepatic function panel   Gamma GT   Vitamin B12 deficiency       History of low levels reported, check today and initiate supplement as needed.   Relevant Orders   Vitamin B12   Encounter for annual physical exam       Annual exam today with health maintenance reviewed.        Follow up plan: Return in about 5 months (around 06/13/2022) for IFG, HTN/HLD, MOOD -- colonoscopy order and mammogram needed.   LABORATORY TESTING:  - Pap smear: not applicable  IMMUNIZATIONS:   - Tdap: Tetanus vaccination status reviewed: last tetanus booster within 10 years. - Influenza: Up to date - Pneumovax: Not applicable - Prevnar: Not applicable - COVID: Up to date - HPV: Not applicable - Shingrix vaccine: Up to date  SCREENING: -Mammogram: Up to date due in July - Colonoscopy: Up to date due in July - Bone Density: Not applicable  -Hearing Test: Not applicable  -Spirometry: Not applicable   PATIENT COUNSELING:   Advised to take 1 mg of folate supplement per day if capable of pregnancy.   Sexuality: Discussed sexually transmitted diseases, partner selection, use of condoms, avoidance of unintended pregnancy  and contraceptive alternatives.   Advised to avoid cigarette smoking.  I discussed with the patient that most people either abstain from alcohol or drink within safe limits (<=14/week and <=4  drinks/occasion for males, <=7/weeks and <= 3 drinks/occasion for females) and that the risk for alcohol disorders and other health effects rises proportionally with the number of drinks per week and how often a drinker exceeds daily limits.  Discussed cessation/primary prevention of drug use and availability of treatment for abuse.   Diet: Encouraged to adjust caloric intake to maintain  or achieve ideal body weight, to reduce intake of dietary saturated fat and total fat, to limit sodium intake by avoiding high sodium foods and not adding table salt, and to maintain adequate dietary potassium and calcium preferably from fresh fruits, vegetables, and low-fat dairy products.    Stressed the importance of regular exercise  Injury prevention: Discussed safety belts, safety helmets, smoke detector, smoking near bedding or upholstery.   Dental health: Discussed importance of regular tooth brushing, flossing, and dental visits.    NEXT PREVENTATIVE PHYSICAL DUE IN 1 YEAR. Return in about 5 months (around 06/13/2022) for IFG, HTN/HLD, MOOD -- colonoscopy order and mammogram needed.

## 2022-01-01 NOTE — Assessment & Plan Note (Signed)
Ongoing, continue supplement and check level today. 

## 2022-01-01 NOTE — Assessment & Plan Note (Addendum)
Baseline, continue to monitor.  Lives in stable group home setting.  Will write script for adult pull-up briefs for patient when needed. 

## 2022-01-01 NOTE — Assessment & Plan Note (Signed)
Chronic, ongoing.  Stable with Omeprazole, has tried reductions without success.  Continue current medication regimen and adjust as needed.  Mag level today. 

## 2022-01-01 NOTE — Assessment & Plan Note (Signed)
Ongoing per nephrology notes, with recent PTH level 71.  Continue collaboration with nephrology, recent note reviewed.  Recent CMP and labs with nephrology reviewed, will not repeat today.

## 2022-01-01 NOTE — Assessment & Plan Note (Signed)
Chronic, stable with BP at goal in office today.  Continue current medication regimen and collaboration with nephrology.  Lisinopril for kidney protection, refills up to date.  Recommend checking BP at home on occasion and documenting.  Labs today: CMP and CBC up to date with nephrology -- obtain TSH, LFTs, and urine ALB today.   Return in 6 months.

## 2022-01-01 NOTE — Assessment & Plan Note (Signed)
Chronic, ongoing, refills sent in on Colace. 

## 2022-01-01 NOTE — Assessment & Plan Note (Signed)
Chronic, stable, followed by psychiatry.  Continue current medication regimen as prescribed by them, recent note reviewed.  Denies SI/HI. 

## 2022-01-01 NOTE — Assessment & Plan Note (Signed)
BMI 45.08 last visit with HTN, CKD.  Recommended eating smaller high protein, low fat meals more frequently and exercising 30 mins a day 5 times a week with a goal of 10-15lb weight loss in the next 3 months. Patient voiced their understanding and motivation to adhere to these recommendations.

## 2022-01-02 LAB — VITAMIN D 25 HYDROXY (VIT D DEFICIENCY, FRACTURES): Vit D, 25-Hydroxy: 38.9 ng/mL (ref 30.0–100.0)

## 2022-01-02 LAB — HEPATIC FUNCTION PANEL
ALT: 19 IU/L (ref 0–32)
AST: 20 IU/L (ref 0–40)
Albumin: 4 g/dL (ref 3.8–4.9)
Alkaline Phosphatase: 130 IU/L — ABNORMAL HIGH (ref 44–121)
Bilirubin Total: <0.2 mg/dL (ref 0.0–1.2)
Bilirubin, Direct: <0.1 mg/dL (ref 0.00–0.40)
Total Protein: 7.2 g/dL (ref 6.0–8.5)

## 2022-01-02 LAB — GAMMA GT: GGT: 24 IU/L (ref 0–60)

## 2022-01-02 LAB — LIPID PANEL W/O CHOL/HDL RATIO
Cholesterol, Total: 168 mg/dL (ref 100–199)
HDL: 54 mg/dL (ref >39)
LDL Chol Calc (NIH): 80 mg/dL (ref 0–99)
Triglycerides: 205 mg/dL — ABNORMAL HIGH (ref 0–149)
VLDL Cholesterol Cal: 34 mg/dL (ref 5–40)

## 2022-01-02 LAB — MAGNESIUM: Magnesium: 2.2 mg/dL (ref 1.6–2.3)

## 2022-01-02 LAB — VITAMIN B12: Vitamin B-12: 532 pg/mL (ref 232–1245)

## 2022-01-02 LAB — TSH: TSH: 1.52 u[IU]/mL (ref 0.450–4.500)

## 2022-01-02 NOTE — Progress Notes (Signed)
Contacted via [place] morning Rita [name], your labs have returned and are overall stable.  Continue all current medications.  Thyroid lab is pending -- I will alert you if any concerns with this.  Have a great rainy day!! Keep being awesome!!  Thank you for allowing me to participate in your care.  I appreciate you. Kindest regards, Rita [name]

## 2022-01-21 ENCOUNTER — Other Ambulatory Visit: Payer: Self-pay | Admitting: Nurse Practitioner

## 2022-01-22 NOTE — Telephone Encounter (Signed)
Requested medications are due for refill today.  yes  Requested medications are on the active medications list.  yes  Last refill. 12/25/2021 #0 0 refills  Future visit scheduled.   yes  Notes to clinic.  Failed protocol d/t expired labs.    Requested Prescriptions  Pending Prescriptions Disp Refills   aspirin 81 MG chewable tablet [Pharmacy Med Name: ASPIRIN 81 MG CHEWABLE TABLET] 30 tablet 0    Sig: TAKE 1 TABLET BY MOUTH ONCE DAILY.     Analgesics:  NSAIDS - aspirin Failed - 01/21/2022 11:38 AM      Failed - Cr in normal range and within 360 days    Creatinine  Date Value Ref Range Status  12/28/2013 1.36 (H) 0.60 - 1.30 mg/dL Final   Creatinine, Ser  Date Value Ref Range Status  09/13/2020 1.52 (H) 0.57 - 1.00 mg/dL Final          Failed - eGFR is 10 or above and within 360 days    EGFR (African American)  Date Value Ref Range Status  12/28/2013 52 (L)  Final   GFR calc Af Amer  Date Value Ref Range Status  09/13/2020 43 (L) >59 mL/min/1.73 Final    Comment:    **In accordance with recommendations from the NKF-ASN Task force,**   [facility] is in the process of updating its eGFR calculation to the   2021 CKD-EPI creatinine equation that estimates kidney function   without a race variable.    EGFR (Non-African Amer.)  Date Value Ref Range Status  12/28/2013 45 (L)  Final    Comment:    eGFR values <45m/min/1.73 m2 may be an indication of chronic kidney disease (CKD). Calculated eGFR is useful in patients with stable renal function. The eGFR calculation will not be reliable in acutely ill patients when serum creatinine is changing rapidly. It is not useful in  patients on dialysis. The eGFR calculation may not be applicable to patients at the low and high extremes of body sizes, pregnant women, and vegetarians.    GFR calc non Af Amer  Date Value Ref Range Status  09/13/2020 38 (L) >59 mL/min/1.73 Final          Passed - Patient is not pregnant       Passed - Valid encounter within last 12 months    Recent Outpatient Visits           3 weeks ago Schizoaffective disorder, bipolar type ([place]   [place] Faster NP   6 months ago [place] Glad NP   10 months ago Sch[place]ive disorder, bipolar type [place]   [place] Faster NP   1 year ago Schiz[place]ffective disorder, bipolar type ([place]   [place] Jolene T, NP   1 year ago Acute right-sided low back pain with right-sided sciatica   [place] Faster NP       Future Appointments             In 4 months Cannady, JBarbaraann Faster NP [facility]

## 2022-01-29 ENCOUNTER — Telehealth: Payer: No Typology Code available for payment source | Admitting: Psychiatry

## 2022-01-31 ENCOUNTER — Encounter: Payer: Self-pay | Admitting: Nurse Practitioner

## 2022-02-05 ENCOUNTER — Telehealth: Payer: No Typology Code available for payment source | Admitting: Psychiatry

## 2022-02-12 ENCOUNTER — Other Ambulatory Visit: Payer: Self-pay

## 2022-02-12 ENCOUNTER — Telehealth (INDEPENDENT_AMBULATORY_CARE_PROVIDER_SITE_OTHER): Payer: 59 | Admitting: Psychiatry

## 2022-02-12 DIAGNOSIS — F819 Developmental disorder of scholastic skills, unspecified: Secondary | ICD-10-CM

## 2022-02-12 DIAGNOSIS — F259 Schizoaffective disorder, unspecified: Secondary | ICD-10-CM

## 2022-02-12 DIAGNOSIS — F411 Generalized anxiety disorder: Secondary | ICD-10-CM | POA: Diagnosis not present

## 2022-02-12 MED ORDER — QUETIAPINE FUMARATE 100 MG PO TABS: 100.0000 mg | ORAL_TABLET | Freq: Every day | ORAL | 5 refills | Status: DC

## 2022-02-12 MED ORDER — TRAZODONE HCL 150 MG PO TABS: ORAL_TABLET | ORAL | 5 refills | Status: DC

## 2022-02-12 MED ORDER — DULOXETINE HCL 60 MG PO CPEP: ORAL_CAPSULE | ORAL | 6 refills | Status: DC

## 2022-02-12 MED ORDER — CLONAZEPAM 0.5 MG PO TABS: 0.5000 mg | ORAL_TABLET | Freq: Two times a day (BID) | ORAL | 5 refills | Status: DC

## 2022-02-12 MED ORDER — DULOXETINE HCL 30 MG PO CPEP: 30.0000 mg | ORAL_CAPSULE | Freq: Every day | ORAL | 5 refills | Status: DC

## 2022-02-12 NOTE — Progress Notes (Signed)
Virtual Visit via Telephone Note ? ?I connected with Rita [name] on 02/12/22 at  1:00 PM EDT by telephone and verified that I am speaking with the correct person using two identifiers. ? ?Location: ?Patient: Home ?Provider: [facility] ?  ?I discussed the limitations, risks, security and privacy concerns of performing an evaluation and management service by telephone and the availability of in person appointments. I also discussed with the patient that there may be a patient responsible charge related to this service. The patient expressed understanding and agreed to proceed. ? ? ?History of Present Illness: Patient reached by telephone.  Caretaker on the phone as well.  Patient has no new complaints.  Mood has been good.  No behavior problems new mood issues.  Sleeping better.  Physically doing well.  No reports of suicidal ideation.  Pain problems under better control.  Patient sounds euthymic appropriate pretty much like she always does.  No complaints.  Continue all medications everything reordered.  Follow-up 6 months ? ?  ?Observations/Objective: ? ? ?Assessment and Plan: ? ? ?Follow Up Instructions: ? ?  ?I discussed the assessment and treatment plan with the patient. The patient was provided an opportunity to ask questions and all were answered. The patient agreed with the plan and demonstrated an understanding of the instructions. ?  ?The patient was advised to call back or seek an in-person evaluation if the symptoms worsen or if the condition fails to improve as anticipated. ? ?I provided 20 minutes of non-face-to-face time during this encounter. ? ? ?Alethia Berthold, MD  ?

## 2022-05-16 ENCOUNTER — Other Ambulatory Visit: Payer: Self-pay | Admitting: Nurse Practitioner

## 2022-05-16 NOTE — Telephone Encounter (Signed)
Requested medication (s) are due for refill today: yes  Requested medication (s) are on the active medication list: yes    Last refill: 12/25/21 #90  4 refills  Future visit scheduled yes 05/28/22  Notes to clinic:FAiled due to labs, please review. Thank you.  Requested Prescriptions  Pending Prescriptions Disp Refills   cholecalciferol (VITAMIN D) 25 MCG (1000 UNIT) tablet [Pharmacy Med Name: VITAMIN D3 1,000 UNITS TAB] 60 tablet 0    Sig: TAKE (2) TABLETS BY MOUTH ONCE DAILY.     Endocrinology:  Vitamins - Vitamin D Supplementation 2 Failed - 05/16/2022  9:32 AM      Failed - Manual Review: Route requests for 50,000 IU strength to the provider      Failed - Ca in normal range and within 360 days    Calcium  Date Value Ref Range Status  09/13/2020 9.4 8.7 - 10.2 mg/dL Final   Calcium, Total  Date Value Ref Range Status  12/28/2013 8.6 8.5 - 10.1 mg/dL Final         Passed - Vitamin D in normal range and within 360 days    Vit D, 25-Hydroxy  Date Value Ref Range Status  01/01/2022 38.9 30.0 - 100.0 ng/mL Final    Comment:    Vitamin D deficiency has been defined by the Institute of Medicine and an Endocrine Society practice guideline as a level of serum 25-OH vitamin D less than 20 ng/mL (1,2). The Endocrine Society went on to further define vitamin D insufficiency as a level between 21 and 29 ng/mL (2). 1. IOM (Institute of Medicine). 2010. Dietary reference    intakes for calcium and D. [place]: The    [employer]. 2. Holick MF, Binkley Moroni, Bischoff-Ferrari HA, et al.    Evaluation, treatment, and prevention of vitamin D    deficiency: an Endocrine Society clinical practice    guideline. JCEM. 2011 Jul; 96(7):1911-30.          Passed - Valid encounter within last 12 months    Recent Outpatient Visits           4 months ago Schizoaffective disorder, bipolar type ([place])   [place], Barbaraann Faster, NP   10 months ago [place], Santiago Glad, NP   1 year ago Schizoaffective disorder, bipolar type ([place])   [place], Barbaraann Faster, NP   1 year ago Schizoaffective disorder, bipolar type ([place])   [place], Jolene T, NP   1 year ago Acute right-sided low back pain with right-sided sciatica   [facility] Venita Lick, NP       Future Appointments             In 1 week Cannady, Barbaraann Faster, NP [facility]

## 2022-05-28 ENCOUNTER — Encounter: Payer: Self-pay | Admitting: Nurse Practitioner

## 2022-05-28 ENCOUNTER — Ambulatory Visit (INDEPENDENT_AMBULATORY_CARE_PROVIDER_SITE_OTHER): Payer: 59 | Admitting: Nurse Practitioner

## 2022-05-28 VITALS — BP 126/63 | HR 68 | Temp 98.0°F | Ht 59.5 in | Wt 231.0 lb

## 2022-05-28 DIAGNOSIS — F819 Developmental disorder of scholastic skills, unspecified: Secondary | ICD-10-CM

## 2022-05-28 DIAGNOSIS — I10 Essential (primary) hypertension: Secondary | ICD-10-CM

## 2022-05-28 DIAGNOSIS — E210 Primary hyperparathyroidism: Secondary | ICD-10-CM | POA: Diagnosis not present

## 2022-05-28 DIAGNOSIS — E782 Mixed hyperlipidemia: Secondary | ICD-10-CM

## 2022-05-28 DIAGNOSIS — N1832 Chronic kidney disease, stage 3b: Secondary | ICD-10-CM | POA: Diagnosis not present

## 2022-05-28 DIAGNOSIS — F250 Schizoaffective disorder, bipolar type: Secondary | ICD-10-CM | POA: Diagnosis not present

## 2022-05-28 DIAGNOSIS — R7301 Impaired fasting glucose: Secondary | ICD-10-CM

## 2022-05-28 DIAGNOSIS — Z1231 Encounter for screening mammogram for malignant neoplasm of breast: Secondary | ICD-10-CM

## 2022-05-28 DIAGNOSIS — K621 Rectal polyp: Secondary | ICD-10-CM

## 2022-05-28 LAB — BAYER DCA HB A1C WAIVED: HB A1C (BAYER DCA - WAIVED): 5.6 % (ref 4.8–5.6)

## 2022-05-28 NOTE — Assessment & Plan Note (Signed)
Chronic, stable. Continue collaboration with psychiatry. Denies SI/HI.

## 2022-05-28 NOTE — Assessment & Plan Note (Signed)
Chronic, stable with BP at goal in office today.  Continue current medication regimen and collaboration with nephrology.  Lisinopril for kidney protection.  Recommend checking BP at home on occasion and documenting.  Labs up to date with nephrology.   Return for physical in January.

## 2022-05-28 NOTE — Assessment & Plan Note (Signed)
Chronic, stable.  Continue collaboration with nephrology and Lisinopril for kidney protection,.  Recent labs with nephrology reviewed.

## 2022-05-28 NOTE — Assessment & Plan Note (Signed)
Chronic, ongoing.  Continue current medication regimen and adjust as needed. Lipid panel today. 

## 2022-05-28 NOTE — Progress Notes (Signed)
BP 126/63 (BP Location: Left Arm, Cuff Size: Large)   Pulse 68   Temp 98 F (36.7 C) (Oral)   Ht 4' 11.5" (1.511 m)   Wt 231 lb (104.8 kg)   LMP  (LMP Unknown)   SpO2 96%   BMI 45.88 kg/m    Subjective:    Patient ID: Rita [name], female    DOB: 1961-04-05, 61 y.o.   MRN: [id number]  HPI: Rita [name] is a 61 y.o. female  Chief Complaint  Patient presents with   Hyperlipidemia   Hypertension   Mood   IFG   NOTE WRITTEN BY FNP STUDENT.  ASSESSMENT AND PLAN OF CARE REVIEWED WITH STUDENT, AGREE WITH ABOVE FINDINGS AND PLAN.   HYPERTENSION / HYPERLIPIDEMIA Continues on Crestor, Lisinopril and Carvedilol + ASA. Satisfied with current treatment? no Duration of hypertension: chronic BP monitoring frequency: not checking BP range:  BP medication side effects: no Duration of hyperlipidemia: chronic Cholesterol medication side effects: no Cholesterol supplements: none Medication compliance: excellent compliance Aspirin: yes Recent stressors: no Recurrent headaches: no Visual changes: no Palpitations: no Dyspnea: no Chest pain: no Lower extremity edema: no Dizzy/lightheaded: no   CHRONIC KIDNEY DISEASE Last saw nephrology 05/21/22 -- CRT 1.60, eGFR 37, PTH 63. CKD status: stable Medications renally dose: no Previous renal evaluation: yes Pneumovax:  Up to Date Influenza Vaccine:  Up to Date   IFG No current medications, diet focused.  Recent A1c 5.8%. Taking Vitamin D daily for deficiency.  Continues to drink sodas when on campus -- has been instructed to drink more water by PCP and nephrology. Polydipsia/polyuria: no Visual disturbance: no Chest pain: no Paresthesias: no  SCHIZOAFFECTIVE DISORDER Followed by psychiatry and last seen by Dr. [name] Amend on 02/12/22.  She does have learning disabilities and lives in stable group home.  Does attend therapy once a month with Inetta Fermo. Takes Klonopin + Cymbalta Mood status: stable Satisfied with current treatment?:  yes Symptom severity: mild  Duration of current treatment : chronic Side effects: no Medication compliance: excellent compliance Psychotherapy/counseling: yes current Depressed mood: no Anxious mood: no Anhedonia: no Significant weight loss or gain: no Insomnia: no  Fatigue: no Feelings of worthlessness or guilt: no Impaired concentration/indecisiveness: yes Suicidal ideations: no Hopelessness: no Crying spells: no    05/28/2022   11:05 AM 01/01/2022   11:07 AM 10/24/2021   10:01 AM 03/20/2021    9:34 AM 10/23/2020    9:56 AM  Depression screen PHQ 2/9  Decreased Interest 0 0 0 0 0  Down, Depressed, Hopeless 0 0 0 0 0  PHQ - 2 Score 0 0 0 0 0  Altered sleeping 0 1  0 0  Tired, decreased energy 1 1  0 0  Change in appetite 0 0  0 0  Feeling bad or failure about yourself  0 0  0 0  Trouble concentrating 1 0  0 0  Moving slowly or fidgety/restless 0 0  0 0  Suicidal thoughts 0 0  0 0  PHQ-9 Score 2 2  0 0  Difficult doing work/chores Somewhat difficult Not difficult at all        Relevant past medical, surgical, family and social history reviewed and updated as indicated. Interim medical history since our last visit reviewed. Allergies and medications reviewed and updated.  Review of Systems  Constitutional:  Negative for activity change, appetite change, diaphoresis, fatigue and fever.  Respiratory:  Negative for cough, chest tightness and shortness of breath.  Cardiovascular:  Negative for chest pain, palpitations and leg swelling.  Gastrointestinal: Negative.   Endocrine: Negative for polydipsia, polyphagia and polyuria.  Neurological: Negative.  Negative for dizziness and headaches.  Psychiatric/Behavioral: Negative.      Per HPI unless specifically indicated above     Objective:    BP 126/63 (BP Location: Left Arm, Cuff Size: Large)   Pulse 68   Temp 98 F (36.7 C) (Oral)   Ht 4' 11.5" (1.511 m)   Wt 231 lb (104.8 kg)   LMP  (LMP Unknown)   SpO2 96%    BMI 45.88 kg/m   Wt Readings from Last 3 Encounters:  05/28/22 231 lb (104.8 kg)  01/01/22 227 lb (103 kg)  10/23/20 224 lb (101.6 kg)    Physical Exam Vitals and nursing note reviewed.  Constitutional:      General: She is awake. She is not in acute distress.    Appearance: Normal appearance. She is well-developed. She is obese. She is not ill-appearing or toxic-appearing.  HENT:     Head: Normocephalic and atraumatic.     Right Ear: Hearing normal.     Left Ear: Hearing normal.     Mouth/Throat:     Mouth: Mucous membranes are moist.  Eyes:     General: Lids are normal.        Right eye: No discharge.        Left eye: No discharge.     Extraocular Movements: Extraocular movements intact.     Conjunctiva/sclera: Conjunctivae normal.     Visual Fields: Right eye visual fields normal and left eye visual fields normal.  Neck:     Thyroid: No thyromegaly.     Vascular: No carotid bruit.     Trachea: Trachea normal.  Cardiovascular:     Rate and Rhythm: Normal rate and regular rhythm.     Pulses: Normal pulses.     Heart sounds: Normal heart sounds. No murmur heard.    No gallop.  Pulmonary:     Effort: Pulmonary effort is normal. No accessory muscle usage or respiratory distress.     Breath sounds: Normal breath sounds.  Abdominal:     General: Bowel sounds are normal.     Palpations: Abdomen is soft.     Tenderness: There is no abdominal tenderness.  Musculoskeletal:     Cervical back: Normal range of motion and neck supple.     Right lower leg: No edema.     Left lower leg: No edema.  Lymphadenopathy:     Head:     Right side of head: No submental, submandibular, tonsillar, preauricular or posterior auricular adenopathy.     Left side of head: No submental, submandibular, tonsillar, preauricular or posterior auricular adenopathy.     Cervical: No cervical adenopathy.  Skin:    General: Skin is warm and dry.     Capillary Refill: Capillary refill takes less than 2  seconds.  Neurological:     Mental Status: She is alert and oriented to person, place, and time.     Gait: Gait is intact.     Deep Tendon Reflexes: Reflexes are normal and symmetric.     Reflex Scores:      Brachioradialis reflexes are 2+ on the right side and 2+ on the left side.      Patellar reflexes are 2+ on the right side and 2+ on the left side. Psychiatric:        Attention and Perception: Attention normal.  Mood and Affect: Mood normal.        Speech: Speech normal.        Behavior: Behavior normal. Behavior is cooperative.        Thought Content: Thought content normal.        Judgment: Judgment normal.     Results for orders placed or performed in visit on 01/01/22  Bayer DCA Hb A1c Waived  Result Value Ref Range   HB A1C (BAYER DCA - WAIVED) 5.8 (H) 4.8 - 5.6 %  Microalbumin, Urine Waived  Result Value Ref Range   Microalb, Ur Waived 30 (H) 0 - 19 mg/L   Creatinine, Urine Waived 200 10 - 300 mg/dL   Microalb/Creat Ratio <30 <30 mg/g  Lipid Panel w/o Chol/HDL Ratio  Result Value Ref Range   Cholesterol, Total 168 100 - 199 mg/dL   Triglycerides 161 (H) 0 - 149 mg/dL   HDL 54 >09 mg/dL   VLDL Cholesterol Cal 34 5 - 40 mg/dL   LDL Chol Calc (NIH) 80 0 - 99 mg/dL  TSH  Result Value Ref Range   TSH 1.520 0.450 - 4.500 uIU/mL  VITAMIN D 25 Hydroxy (Vit-D Deficiency, Fractures)  Result Value Ref Range   Vit D, 25-Hydroxy 38.9 30.0 - 100.0 ng/mL  Vitamin B12  Result Value Ref Range   Vitamin B-12 532 232 - 1,245 pg/mL  Magnesium  Result Value Ref Range   Magnesium 2.2 1.6 - 2.3 mg/dL  Hepatic function panel  Result Value Ref Range   Total Protein 7.2 6.0 - 8.5 g/dL   Albumin 4.0 3.8 - 4.9 g/dL   Bilirubin Total <6.0 0.0 - 1.2 mg/dL   Bilirubin, Direct <4.54 0.00 - 0.40 mg/dL   Alkaline Phosphatase 130 (H) 44 - 121 IU/L   AST 20 0 - 40 IU/L   ALT 19 0 - 32 IU/L  Gamma GT  Result Value Ref Range   GGT 24 0 - 60 IU/L      Assessment & Plan:    Problem List Items Addressed This Visit       Cardiovascular and Mediastinum   Essential hypertension    Chronic, stable with BP at goal in office today.  Continue current medication regimen and collaboration with nephrology.  Lisinopril for kidney protection.  Recommend checking BP at home on occasion and documenting.  Labs up to date with nephrology.   Return for physical in January.         Endocrine   IFG (impaired fasting glucose)    Chronic, ongoing. Recommend continue diet focus, cutting back on soda. She has started drinking flavored water and less soda. Recheck A1c today.       Relevant Orders   Bayer DCA Hb A1c Waived   Primary hyperparathyroidism (HCC)    Ongoing. Continue collaboration with nephrology.         Genitourinary   Chronic kidney disease, stage 3 (HCC) - Primary    Chronic, stable.  Continue collaboration with nephrology and Lisinopril for kidney protection,.  Recent labs with nephrology reviewed.        Other   Learning disability    Baseline, continue to monitor. Lives in a stable group home.       Hyperlipidemia    Chronic, ongoing. Continue current medication regimen and adjust as needed. Lipid panel today.       Relevant Orders   Lipid Panel w/o Chol/HDL Ratio   Schizoaffective disorder, bipolar type (HCC)    Chronic,  stable. Continue collaboration with psychiatry. Denies SI/HI.       Morbid obesity (HCC)    BMI 45.88 last visit with HTN, CKD.  Recommended eating smaller high protein, low fat meals more frequently and exercising 30 mins a day 5 times a week with a goal of 10-15lb weight loss in the next 3 months. Patient voiced their understanding and motivation to adhere to these recommendations.       Other Visit Diagnoses     Rectal polyp       GI referral in place.   Relevant Orders   Ambulatory referral to Gastroenterology   Encounter for screening mammogram for malignant neoplasm of breast       Mammogram ordered.    Relevant Orders   MM 3D SCREEN BREAST BILATERAL        Follow up plan: Return in about 7 months (around 01/01/2023) for Annual physical after 01/01/23.

## 2022-05-29 LAB — LIPID PANEL W/O CHOL/HDL RATIO
Cholesterol, Total: 158 mg/dL (ref 100–199)
HDL: 47 mg/dL (ref >39)
LDL Chol Calc (NIH): 71 mg/dL (ref 0–99)
Triglycerides: 250 mg/dL — ABNORMAL HIGH (ref 0–149)
VLDL Cholesterol Cal: 40 mg/dL (ref 5–40)

## 2022-05-29 NOTE — Progress Notes (Signed)
Contacted via [place] morning Felcia, your cholesterol labs remain stable.  No changes needed.  Continue diet focus to help lower those triglycerides.  Continue Rosuvastatin daily.  Any questions? Keep being amazing!!  Thank you for allowing me to participate in your care.  I appreciate you. Kindest regards, Cassundra Mckeever

## 2022-05-31 ENCOUNTER — Ambulatory Visit: Payer: 59 | Admitting: Nurse Practitioner

## 2022-06-10 ENCOUNTER — Other Ambulatory Visit: Payer: Self-pay

## 2022-06-10 ENCOUNTER — Telehealth: Payer: Self-pay

## 2022-06-10 DIAGNOSIS — Z8719 Personal history of other diseases of the digestive system: Secondary | ICD-10-CM

## 2022-06-10 MED ORDER — NA SULFATE-K SULFATE-MG SULF 17.5-3.13-1.6 GM/177ML PO SOLN: 1.0000 | Freq: Once | ORAL | 0 refills | Status: AC

## 2022-06-10 NOTE — Telephone Encounter (Signed)
Gastroenterology Pre-Procedure Review  Request Date: 07/23/22 Requesting Physician: Dr. Vicente Males  PATIENT REVIEW QUESTIONS: The patients caregiver Huey Bienenstock responded to the following health history questions as indicated:    1. Are you having any GI issues? no 2. Do you have a personal history of Polyps?yes 06/27/17 Dr. Vira Agar noted diminutive sessile polyp recto-sigmoid colon /3. Do you have a family history of Colon Cancer or Polyps? no 4. Diabetes Mellitus? no 5. Joint replacements in the past 12 months?no 6. Major health problems in the past 3 months?no 7. Any artificial heart valves, MVP, or defibrillator?no    MEDICATIONS & ALLERGIES:    Patient reports the following regarding taking any anticoagulation/antiplatelet therapy:   Plavix, Coumadin, Eliquis, Xarelto, Lovenox, Pradaxa, Brilinta, or Effient? no Aspirin? yes ('81mg'$  daily)  Patient confirms/reports the following medications:  Current Outpatient Medications  Medication Sig Dispense Refill   ACETAMINOPHEN EXTRA STRENGTH 500 MG tablet TAKE (2) TABLETS BY MOUTH EVERY SIX HOURS AS NEEDED. 240 tablet 0   aspirin 81 MG chewable tablet TAKE 1 TABLET BY MOUTH ONCE DAILY. 30 tablet 12   carvedilol (COREG) 6.25 MG tablet Take 6.25 mg by mouth 2 (two) times daily.     cholecalciferol (VITAMIN D) 25 MCG (1000 UNIT) tablet TAKE (2) TABLETS BY MOUTH ONCE DAILY. 180 tablet 4   clonazePAM (KLONOPIN) 0.5 MG tablet Take 1 tablet (0.5 mg total) by mouth 2 (two) times daily. 60 tablet 5   diclofenac Sodium (VOLTAREN) 1 % GEL Apply 2 g topically 3 (three) times daily. 100 g 4   docusate sodium (COLACE) 100 MG capsule TAKE (1) CAPSULE BY MOUTH TWICE DAILY AS NEEDED. 180 capsule 4   DULoxetine (CYMBALTA) 30 MG capsule Take 1 capsule (30 mg total) by mouth at bedtime. 30 capsule 5   DULoxetine (CYMBALTA) 60 MG capsule TAKE (1) CAPSULE BY MOUTH ONCE DAILY. 30 capsule 6   GNP COUGH DM ER 30 MG/5ML liquid GIVE 2.5MLS BY MOUTH AS NEEDED FOR COUGH.  30 mL 0   lisinopril (ZESTRIL) 5 MG tablet Take 1 tablet (5 mg total) by mouth daily. 90 tablet 4   magnesium oxide (MAG-OX) 400 (240 Mg) MG tablet Take 1 tablet (400 mg total) by mouth daily. 90 tablet 4   Multiple Vitamin (TAB-A-VITE) TABS TAKE 1 TABLET BY MOUTH ONCE DAILY. 90 tablet 4   omeprazole (PRILOSEC) 20 MG capsule TAKE (1) CAPSULE BY MOUTH ONCE DAILY. 90 capsule 4   QUEtiapine (SEROQUEL) 100 MG tablet Take 1 tablet (100 mg total) by mouth at bedtime. 30 tablet 5   rosuvastatin (CRESTOR) 40 MG tablet Take 1 tablet (40 mg total) by mouth daily. for cholesterol. 90 tablet 4   SUMAtriptan (IMITREX) 100 MG tablet Take by mouth.     traZODone (DESYREL) 150 MG tablet TAKE (1) TABLET BY MOUTH DAILY AT BEDTIME. 30 tablet 5   No current facility-administered medications for this visit.    Patient confirms/reports the following allergies:  Allergies  Allergen Reactions   Advil [Ibuprofen] Other (See Comments)    Reaction:  Gives her kidney problems     No orders of the defined types were placed in this encounter.   AUTHORIZATION INFORMATION Primary Insurance: 1D#: Group #:  Secondary Insurance: 1D#: Group #:  SCHEDULE INFORMATION: Date: 07/23/22 Time: Location: [facility]

## 2022-07-23 ENCOUNTER — Encounter
Admission: RE | Disposition: A | Discharge status: Home / Self Care | Payer: Self-pay | Source: Home / Self Care | Attending: Gastroenterology

## 2022-07-23 ENCOUNTER — Ambulatory Visit: Payer: 59 | Admitting: Certified Registered Nurse Anesthetist

## 2022-07-23 ENCOUNTER — Encounter: Payer: Self-pay | Admitting: Gastroenterology

## 2022-07-23 ENCOUNTER — Ambulatory Visit
Admission: RE | Admit: 2022-07-23 | Discharge: 2022-07-23 | Disposition: A | Discharge status: Home / Self Care | Payer: 59 | Attending: Gastroenterology | Admitting: Gastroenterology

## 2022-07-23 DIAGNOSIS — F418 Other specified anxiety disorders: Secondary | ICD-10-CM | POA: Insufficient documentation

## 2022-07-23 DIAGNOSIS — Z6841 Body Mass Index (BMI) 40.0 and over, adult: Secondary | ICD-10-CM | POA: Insufficient documentation

## 2022-07-23 DIAGNOSIS — I129 Hypertensive chronic kidney disease with stage 1 through stage 4 chronic kidney disease, or unspecified chronic kidney disease: Secondary | ICD-10-CM | POA: Diagnosis not present

## 2022-07-23 DIAGNOSIS — Z1211 Encounter for screening for malignant neoplasm of colon: Secondary | ICD-10-CM | POA: Insufficient documentation

## 2022-07-23 DIAGNOSIS — D122 Benign neoplasm of ascending colon: Secondary | ICD-10-CM | POA: Insufficient documentation

## 2022-07-23 DIAGNOSIS — R7303 Prediabetes: Secondary | ICD-10-CM | POA: Diagnosis not present

## 2022-07-23 DIAGNOSIS — N183 Chronic kidney disease, stage 3 unspecified: Secondary | ICD-10-CM | POA: Diagnosis not present

## 2022-07-23 DIAGNOSIS — K219 Gastro-esophageal reflux disease without esophagitis: Secondary | ICD-10-CM | POA: Insufficient documentation

## 2022-07-23 DIAGNOSIS — F79 Unspecified intellectual disabilities: Secondary | ICD-10-CM | POA: Diagnosis not present

## 2022-07-23 DIAGNOSIS — Z8719 Personal history of other diseases of the digestive system: Secondary | ICD-10-CM

## 2022-07-23 DIAGNOSIS — F259 Schizoaffective disorder, unspecified: Secondary | ICD-10-CM | POA: Diagnosis not present

## 2022-07-23 HISTORY — PX: COLONOSCOPY WITH PROPOFOL: SHX5780

## 2022-07-23 SURGERY — COLONOSCOPY WITH PROPOFOL
Anesthesia: General

## 2022-07-23 MED ORDER — PROPOFOL 500 MG/50ML IV EMUL
INTRAVENOUS | Status: DC | PRN
  Administered 2022-07-23: 150 ug/kg/min via INTRAVENOUS

## 2022-07-23 MED ORDER — LIDOCAINE HCL (CARDIAC) PF 100 MG/5ML IV SOSY
PREFILLED_SYRINGE | INTRAVENOUS | Status: DC | PRN
  Administered 2022-07-23: 50 mg via INTRAVENOUS

## 2022-07-23 MED ORDER — SODIUM CHLORIDE 0.9 % IV SOLN: INTRAVENOUS | Status: DC

## 2022-07-23 MED ORDER — PROPOFOL 10 MG/ML IV BOLUS
INTRAVENOUS | Status: DC | PRN
  Administered 2022-07-23: 60 mg via INTRAVENOUS
  Administered 2022-07-23: 20 mg via INTRAVENOUS

## 2022-07-23 MED ORDER — PROPOFOL 10 MG/ML IV BOLUS
INTRAVENOUS | Status: AC
  Filled 2022-07-23: qty 40

## 2022-07-23 MED ORDER — LIDOCAINE HCL (PF) 2 % IJ SOLN
INTRAMUSCULAR | Status: AC
  Filled 2022-07-23: qty 5

## 2022-07-23 NOTE — Anesthesia Procedure Notes (Signed)
Procedure Name: MAC Date/Time: 07/23/2022 9:42 AM  Performed by: Tollie Eth, CRNAPre-anesthesia Checklist: Patient identified, Emergency Drugs available, Suction available and Patient being monitored Patient Re-evaluated:Patient Re-evaluated prior to induction Oxygen Delivery Method: Nasal cannula Induction Type: IV induction Placement Confirmation: positive ETCO2

## 2022-07-23 NOTE — Transfer of Care (Signed)
Immediate Anesthesia Transfer of Care Note  Patient: Rita [name]  Procedure(s) Performed: COLONOSCOPY WITH PROPOFOL  Patient Location: Endoscopy Unit  Anesthesia Type:General  Level of Consciousness: drowsy  Airway & Oxygen Therapy: Patient Spontanous Breathing  Post-op Assessment: Report given to RN and Post -op Vital signs reviewed and stable  Post vital signs: Reviewed and stable  Last Vitals:  Vitals Value Taken Time  BP    Temp    Pulse 90 07/23/22 1013  Resp 18 07/23/22 1013  SpO2 97 % 07/23/22 1013  Vitals shown include unvalidated device data.  Last Pain:  Vitals:   07/23/22 0921  TempSrc: Temporal  PainSc: 0-No pain         Complications: No notable events documented.

## 2022-07-23 NOTE — Anesthesia Preprocedure Evaluation (Signed)
Anesthesia Evaluation  Patient identified by MRN, date of birth, ID band Patient awake    Reviewed: Allergy & Precautions, NPO status , Patient's Chart, lab work & pertinent test results  History of Anesthesia Complications Negative for: history of anesthetic complications  Airway Mallampati: III   Neck ROM: Full    Dental  (+) Missing   Pulmonary neg pulmonary ROS,    Pulmonary exam normal breath sounds clear to auscultation       Cardiovascular hypertension, Normal cardiovascular exam Rhythm:Regular Rate:Normal     Neuro/Psych  Headaches, PSYCHIATRIC DISORDERS (schizoaffective disorder) Anxiety Depression Intellectual disability    GI/Hepatic GERD  ,  Endo/Other  Prediabetes, class 3 obesity  Renal/GU Renal disease (stage III CKD)     Musculoskeletal   Abdominal   Peds  Hematology negative hematology ROS (+)   Anesthesia Other Findings   Reproductive/Obstetrics                             Anesthesia Physical Anesthesia Plan  ASA: 3  Anesthesia Plan: General   Post-op Pain Management:    Induction: Intravenous  PONV Risk Score and Plan: 3 and Propofol infusion, TIVA and Treatment may vary due to age or medical condition  Airway Management Planned: Natural Airway  Additional Equipment:   Intra-op Plan:   Post-operative Plan:   Informed Consent: I have reviewed the patients History and Physical, chart, labs and discussed the procedure including the risks, benefits and alternatives for the proposed anesthesia with the patient or authorized representative who has indicated his/her understanding and acceptance.     Consent reviewed with POA  Plan Discussed with: CRNA  Anesthesia Plan Comments: (LMA/GETA backup discussed.  Patient and POA at bedside consented for risks of anesthesia including but not limited to:  - adverse reactions to medications - damage to eyes, teeth,  lips or other oral mucosa - nerve damage due to positioning  - sore throat or hoarseness - damage to heart, brain, nerves, lungs, other parts of body or loss of life  Informed patient and POA about role of CRNA in peri- and intra-operative care; they voiced understanding.)        Anesthesia Quick Evaluation

## 2022-07-23 NOTE — Anesthesia Postprocedure Evaluation (Signed)
Anesthesia Post Note  Patient: Rita [name]  Procedure(s) Performed: COLONOSCOPY WITH PROPOFOL  Patient location during evaluation: PACU Anesthesia Type: General Level of consciousness: awake and alert, oriented and patient cooperative Pain management: pain level controlled Vital Signs Assessment: post-procedure vital signs reviewed and stable Respiratory status: spontaneous breathing, nonlabored ventilation and respiratory function stable Cardiovascular status: blood pressure returned to baseline and stable Postop Assessment: adequate PO intake Anesthetic complications: no   No notable events documented.   Last Vitals:  Vitals:   07/23/22 0921 07/23/22 1012  BP: (!) 163/78 124/70  Pulse: 88   Resp: 16   Temp: (!) 35.9 C (!) 35.9 C  SpO2: 100%     Last Pain:  Vitals:   07/23/22 1032  TempSrc:   PainSc: 0-No pain                 Darrin Nipper

## 2022-07-23 NOTE — H&P (Signed)
Jonathon Bellows, MD [address], [employer], [place], [place], [address] [address], [place], [place], [place], [address] Phone: [phone]  Fax: [phone]  Primary Care Physician:  Venita Lick, NP   Pre-Procedure History & Physical: HPI:  Rita [name] is a 61 y.o. female is here for an colonoscopy.   Past Medical History:  Diagnosis Date   Adenomatous polyps    Anxiety    Chronic constipation    Chronic tension headaches    CKD (chronic kidney disease) stage 3, GFR 30-59 ml/min (HCC)    Depression    Factitious disorder    GERD (gastroesophageal reflux disease)    Hyperlipidemia    Hypertension    Intellectual disability due to developmental disorder, unspecified    Mental retardation    Microscopic hematuria    Migraine    Obesity     Past Surgical History:  Procedure Laterality Date   CARPAL TUNNEL RELEASE     COLONOSCOPY WITH PROPOFOL N/A 06/27/2017   Procedure: COLONOSCOPY WITH PROPOFOL;  Surgeon: Manya Silvas, MD;  Location: [facility] ENDOSCOPY;  Service: Endoscopy;  Laterality: N/A;   CYSTOSCOPY W/ RETROGRADES Bilateral 11/15/2015   Procedure: CYSTOSCOPY WITH RETROGRADE PYELOGRAM;  Surgeon: Hollice Espy, MD;  Location: [facility] ORS;  Service: Urology;  Laterality: Bilateral;   US ECHOCARDIOGRAPHY  2012   showed mild heart failure (LVEF 52%); small pericardial effusion    Prior to Admission medications   Medication Sig Start Date End Date Taking? Authorizing Provider  aspirin 81 MG chewable tablet TAKE 1 TABLET BY MOUTH ONCE DAILY. 01/22/22  Yes Cannady, Jolene T, NP  carvedilol (COREG) 6.25 MG tablet Take 6.25 mg by mouth 2 (two) times daily. 10/20/19  Yes [provider]  cholecalciferol (VITAMIN D) 25 MCG (1000 UNIT) tablet TAKE (2) TABLETS BY MOUTH ONCE DAILY. 05/16/22  Yes Cannady, Jolene T, NP  clonazePAM (KLONOPIN) 0.5 MG tablet Take 1 tablet (0.5 mg total) by mouth 2 (two) times daily. 02/12/22  Yes Clapacs, Madie Reno, MD  docusate  sodium (COLACE) 100 MG capsule TAKE (1) CAPSULE BY MOUTH TWICE DAILY AS NEEDED. 01/01/22  Yes Cannady, Jolene T, NP  DULoxetine (CYMBALTA) 60 MG capsule TAKE (1) CAPSULE BY MOUTH ONCE DAILY. 02/12/22  Yes Clapacs, Madie Reno, MD  lisinopril (ZESTRIL) 5 MG tablet Take 1 tablet (5 mg total) by mouth daily. 01/01/22  Yes Cannady, Barbaraann Faster, NP  Multiple Vitamin (TAB-A-VITE) TABS TAKE 1 TABLET BY MOUTH ONCE DAILY. 09/05/21  Yes Cannady, Jolene T, NP  omeprazole (PRILOSEC) 20 MG capsule TAKE (1) CAPSULE BY MOUTH ONCE DAILY. 09/05/21  Yes Cannady, Jolene T, NP  QUEtiapine (SEROQUEL) 100 MG tablet Take 1 tablet (100 mg total) by mouth at bedtime. 02/12/22  Yes Clapacs, Madie Reno, MD  rosuvastatin (CRESTOR) 40 MG tablet Take 1 tablet (40 mg total) by mouth daily. for cholesterol. 01/01/22  Yes Cannady, Jolene T, NP  ACETAMINOPHEN EXTRA STRENGTH 500 MG tablet TAKE (2) TABLETS BY MOUTH EVERY SIX HOURS AS NEEDED. 09/28/21   Cannady, Henrine Screws T, NP  diclofenac Sodium (VOLTAREN) 1 % GEL Apply 2 g topically 3 (three) times daily. 01/01/22   Cannady, Henrine Screws T, NP  DULoxetine (CYMBALTA) 30 MG capsule Take 1 capsule (30 mg total) by mouth at bedtime. 02/12/22   Clapacs, Madie Reno, MD  GNP COUGH DM ER 30 MG/5ML liquid GIVE 2.5MLS BY MOUTH AS NEEDED FOR COUGH. 09/28/21   Cannady, Henrine Screws T, NP  magnesium oxide (MAG-OX) 400 (240  Mg) MG tablet Take 1 tablet (400 mg total) by mouth daily. 11/02/21   Marnee Guarneri T, NP  SUMAtriptan (IMITREX) 100 MG tablet Take by mouth. 02/20/21   [provider]  traZODone (DESYREL) 150 MG tablet TAKE (1) TABLET BY MOUTH DAILY AT BEDTIME. 02/12/22   Clapacs, Madie Reno, MD    Allergies as of 06/10/2022 - Review Complete 05/28/2022  Allergen Reaction Noted   Advil [ibuprofen] Other (See Comments) 07/18/2015    Family History  Problem Relation Age of Onset   Heart disease Father    Hyperlipidemia Father    Kidney disease Father    Breast cancer Maternal Aunt    Prostate cancer Neg Hx     Social  History   Socioeconomic History   Marital status: Single    Spouse name: Not on file   Number of children: Not on file   Years of education: Not on file   Highest education level: High school graduate  Occupational History   Occupation: student  Tobacco Use   Smoking status: Never   Smokeless tobacco: Never  Vaping Use   Vaping Use: Never used  Substance and Sexual Activity   Alcohol use: No   Drug use: No   Sexual activity: Never  Other Topics Concern   Not on file  Social History Narrative   Goes to [employer]   Social Determinants of Health   Financial Resource Strain: Low Risk  (10/24/2021)   Overall Financial Resource Strain (CARDIA)    Difficulty of Paying Living Expenses: Not hard at all  Food Insecurity: No Food Insecurity (10/24/2021)   Hunger Vital Sign    Worried About Running Out of Food in the Last Year: Never true    [place] in the Last Year: Never true  Transportation Needs: No Transportation Needs (10/24/2021)   PRAPARE - Hydrologist (Medical): No    Lack of Transportation (Non-Medical): No  Physical Activity: Insufficiently Active (10/24/2021)   Exercise Vital Sign    Days of Exercise per Week: 3 days    Minutes of Exercise per Session: 30 min  Stress: No Stress Concern Present (10/24/2021)   [place]    Feeling of Stress : Only a little  Social Connections: Unknown (10/24/2021)   Social Connection and Isolation Panel [NHANES]    Frequency of Communication with Friends and Family: More than three times a week    Frequency of Social Gatherings with Friends and Family: More than three times a week    Attends Religious Services: More than 4 times per year    Active Member of [employer] or Organizations: Yes    Attends Archivist Meetings: Not on file    Marital Status: Not on file  Intimate Partner Violence: Not At Risk (10/24/2021)   Humiliation,  Afraid, Rape, and Kick questionnaire    Fear of Current or Ex-Partner: No    Emotionally Abused: No    Physically Abused: No    Sexually Abused: No    Review of Systems: See HPI, otherwise negative ROS  Physical Exam: BP (!) 163/78   Pulse 88   Temp (!) 96.6 F (35.9 C) (Temporal)   Resp 16   Ht '4\' 9"'$  (1.448 m)   Wt 103.9 kg   LMP  (LMP Unknown)   SpO2 100%   BMI 49.56 kg/m  General:   Alert,  pleasant and cooperative in NAD Head:  Normocephalic and atraumatic. Neck:  Supple; no masses or thyromegaly. Lungs:  Clear throughout to auscultation, normal respiratory effort.    Heart:  +S1, +S2, Regular rate and rhythm, No edema. Abdomen:  Soft, nontender and nondistended. Normal bowel sounds, without guarding, and without rebound.   Neurologic:  Alert and  oriented x4;  grossly normal neurologically.  Impression/Plan: Rita [name] is here for an colonoscopy to be performed for Screening colonoscopy average risk   Risks, benefits, limitations, and alternatives regarding  colonoscopy have been reviewed with the patient.  Questions have been answered.  All parties agreeable.   Jonathon Bellows, MD  07/23/2022, 9:41 AM

## 2022-07-23 NOTE — Op Note (Signed)
[facility] Gastroenterology Patient Name: Rita [name] Procedure Date: 07/23/2022 9:40 AM MRN: [id number] Account #: [id number] Date of Birth: [birth year] Admit Type: Outpatient Age: 61 Room: [facility] ENDO ROOM 2 Gender: Female Note Status: Finalized Instrument Name: Jasper Riling [id number] Procedure:             Colonoscopy Indications:           Surveillance: Personal history of adenomatous polyps                         on last colonoscopy > 3 years ago Providers:             Jonathon Bellows MD, MD Medicines:             Monitored Anesthesia Care Complications:         No immediate complications. Procedure:             Pre-Anesthesia Assessment:                        - Prior to the procedure, a History and Physical was                         performed, and patient medications, allergies and                         sensitivities were reviewed. The patient's tolerance                         of previous anesthesia was reviewed.                        - The risks and benefits of the procedure and the                         sedation options and risks were discussed with the                         patient. All questions were answered and informed                         consent was obtained.                        - ASA Grade Assessment: II - A patient with mild                         systemic disease.                        After obtaining informed consent, the colonoscope was                         passed under direct vision. Throughout the procedure,                         the patient's blood pressure, pulse, and oxygen                         saturations were monitored continuously. The  Colonoscope was introduced through the anus and                         advanced to the the cecum, identified by the                         appendiceal orifice. The colonoscopy was performed                         with ease. The patient tolerated the  procedure well.                         The quality of the bowel preparation was good. Findings:      The perianal and digital rectal examinations were normal.      A 4 mm polyp was found in the ascending colon. The polyp was sessile.       The polyp was removed with a cold snare. Resection and retrieval were       complete.      The exam was otherwise without abnormality on direct and retroflexion       views. Impression:            - One 4 mm polyp in the ascending colon, removed with                         a cold snare. Resected and retrieved.                        - The examination was otherwise normal on direct and                         retroflexion views. Recommendation:        - Discharge patient to home (with escort).                        - Resume previous diet.                        - Continue present medications.                        - Await pathology results.                        - Repeat colonoscopy for surveillance based on                         pathology results. Procedure Code(s):     --- Professional ---                        [phone], Colonoscopy, flexible; with removal of                         tumor(s), polyp(s), or other lesion(s) by snare                         technique Diagnosis Code(s):     --- Professional ---  Z86.010, Personal history of colonic polyps                        K63.5, Polyp of colon CPT copyright 2019 American Medical Association. All rights reserved. The codes documented in this report are preliminary and upon coder review may  be revised to meet current compliance requirements. Jonathon Bellows, MD Jonathon Bellows MD, MD 07/23/2022 10:09:44 AM This report has been signed electronically. Number of Addenda: 0 Note Initiated On: 07/23/2022 9:40 AM Scope Withdrawal Time: 0 hours 8 minutes 38 seconds  Total Procedure Duration: 0 hours 13 minutes 11 seconds  Estimated Blood Loss:  Estimated blood loss: none.       [facility]

## 2022-07-24 ENCOUNTER — Encounter: Payer: Self-pay | Admitting: Gastroenterology

## 2022-07-24 LAB — SURGICAL PATHOLOGY

## 2022-07-29 ENCOUNTER — Encounter: Payer: Self-pay | Admitting: Gastroenterology

## 2022-08-06 ENCOUNTER — Other Ambulatory Visit: Payer: Self-pay

## 2022-08-06 MED ORDER — OMEPRAZOLE 20 MG PO CPDR: DELAYED_RELEASE_CAPSULE | ORAL | 1 refills | Status: DC

## 2022-08-06 MED ORDER — TAB-A-VITE PO TABS: 1.0000 | ORAL_TABLET | Freq: Every day | ORAL | 1 refills | Status: DC

## 2022-08-06 NOTE — Telephone Encounter (Signed)
Requested Prescriptions  Pending Prescriptions Disp Refills  . Multiple Vitamin (TAB-A-VITE) TABS 90 tablet 1    Sig: Take 1 tablet by mouth daily.     There is no refill protocol information for this order    . omeprazole (PRILOSEC) 20 MG capsule 90 capsule 1    Sig: TAKE (1) CAPSULE BY MOUTH ONCE DAILY.     Gastroenterology: Proton Pump Inhibitors Passed - 08/06/2022 10:27 AM      Passed - Valid encounter within last 12 months    Recent Outpatient Visits          2 months ago Stage 3b chronic kidney disease ([place])   [place], Jolene T, NP   7 months ago Schizoaffective disorder, bipolar type ([place])   [place], Barbaraann Faster, NP   1 year ago [place], Karen, NP   1 year ago Schizoaffective disorder, bipolar type ([place])   [place], Barbaraann Faster, NP   1 year ago Schizoaffective disorder, bipolar type ([place])   [place], Barbaraann Faster, NP      Future Appointments            In 4 months Cannady, Barbaraann Faster, NP [facility]

## 2022-08-08 ENCOUNTER — Telehealth (INDEPENDENT_AMBULATORY_CARE_PROVIDER_SITE_OTHER): Payer: 59 | Admitting: Psychiatry

## 2022-08-08 DIAGNOSIS — F819 Developmental disorder of scholastic skills, unspecified: Secondary | ICD-10-CM | POA: Diagnosis not present

## 2022-08-08 DIAGNOSIS — F259 Schizoaffective disorder, unspecified: Secondary | ICD-10-CM

## 2022-08-08 MED ORDER — DULOXETINE HCL 60 MG PO CPEP: ORAL_CAPSULE | ORAL | 6 refills | Status: DC

## 2022-08-08 MED ORDER — DULOXETINE HCL 30 MG PO CPEP: 30.0000 mg | ORAL_CAPSULE | Freq: Every day | ORAL | 5 refills | Status: DC

## 2022-08-08 MED ORDER — CLONAZEPAM 0.5 MG PO TABS
0.5000 mg | ORAL_TABLET | Freq: Two times a day (BID) | ORAL | 5 refills | Status: DC
Start: 2022-08-08 — End: 2023-02-17

## 2022-08-08 MED ORDER — TRAZODONE HCL 150 MG PO TABS: ORAL_TABLET | ORAL | 5 refills | Status: DC

## 2022-08-08 MED ORDER — QUETIAPINE FUMARATE 100 MG PO TABS: 100.0000 mg | ORAL_TABLET | Freq: Every day | ORAL | 5 refills | Status: DC

## 2022-08-08 NOTE — Progress Notes (Signed)
Virtual Visit via Telephone Note  I connected with KLARA STJAMES on 08/08/22 at  1:40 PM EDT by telephone and verified that I am speaking with the correct person using two identifiers.  Location: Patient: Home Provider: Hospital   I discussed the limitations, risks, security and privacy concerns of performing an evaluation and management service by telephone and the availability of in person appointments. I also discussed with the patient that there may be a patient responsible charge related to this service. The patient expressed understanding and agreed to proceed.   History of Present Illness: Patient reached by phone.  Identities established.  Patient told me that her mother passed away last month.  Mother was very elderly and had been sick but obviously a very sad situation.  Shera herself appears to be coping well with it.  She reports that she and her sister are focused on taking care of their father and making sure he is stable.  Sarrinah herself has no new complaints.  Group home manager/caretaker reports she has been doing fine as well no problems or complaints everyone agrees that current medicine doses are appropriate    Observations/Objective: Alert and interactive appropriate euthymic no sign of any dangerousness in thinking or behavior   Assessment and Plan: Refilled all medicines we can check up in 6 months   Follow Up Instructions:    I discussed the assessment and treatment plan with the patient. The patient was provided an opportunity to ask questions and all were answered. The patient agreed with the plan and demonstrated an understanding of the instructions.   The patient was advised to call back or seek an in-person evaluation if the symptoms worsen or if the condition fails to improve as anticipated.  I provided 20 minutes of non-face-to-face time during this encounter.   Alethia Berthold, MD

## 2022-08-12 ENCOUNTER — Ambulatory Visit
Admission: RE | Admit: 2022-08-12 | Discharge: 2022-08-12 | Disposition: A | Discharge status: Home / Self Care | Payer: 59 | Source: Ambulatory Visit | Attending: Nurse Practitioner | Admitting: Nurse Practitioner

## 2022-08-12 DIAGNOSIS — Z1231 Encounter for screening mammogram for malignant neoplasm of breast: Secondary | ICD-10-CM | POA: Insufficient documentation

## 2022-08-13 NOTE — Progress Notes (Signed)
Contacted via MyChart   Normal mammogram, may repeat in one year:)

## 2022-08-21 ENCOUNTER — Encounter: Payer: Self-pay | Admitting: Nurse Practitioner

## 2022-08-21 DIAGNOSIS — Z1283 Encounter for screening for malignant neoplasm of skin: Secondary | ICD-10-CM

## 2022-10-21 ENCOUNTER — Other Ambulatory Visit: Payer: Self-pay | Admitting: Nurse Practitioner

## 2022-10-21 NOTE — Telephone Encounter (Signed)
Refilled 11/02/2021 #[id number] rf. Requested Prescriptions  Pending Prescriptions Disp Refills   magnesium oxide (MAG-OX) 400 (240 Mg) MG tablet [Pharmacy Med Name: magnesium oxide 400 mg (241.3 mg magnesium) tablet] 30 tablet 11    Sig: [place] DAY     Endocrinology:  Minerals - Magnesium Supplementation Failed - 10/21/2022  8:03 AM      Failed - Cr in normal range and within 360 days    Creatinine  Date Value Ref Range Status  12/28/2013 1.36 (H) 0.60 - 1.30 mg/dL Final   Creatinine, Ser  Date Value Ref Range Status  09/13/2020 1.52 (H) 0.57 - 1.00 mg/dL Final         Passed - Mg Level in normal range and within 360 days    Magnesium  Date Value Ref Range Status  01/01/2022 2.2 1.6 - 2.3 mg/dL Final  12/28/2013 1.7 (L) mg/dL Final    Comment:    1.8-2.4 THERAPEUTIC RANGE: 4-7 mg/dL TOXIC: > 10 mg/dL  -----------------------          Passed - Valid encounter within last 12 months    Recent Outpatient Visits           4 months ago Stage 3b chronic kidney disease ([place])   [place], Jolene T, NP   9 months ago Schizoaffective disorder, bipolar type ([place])   [place], Henrine Screws T, NP   1 year ago [place], Karen, NP   1 year ago Schizoaffective disorder, bipolar type ([place])   [place] Cannady, Jolene T, NP   2 years ago Schizoaffective disorder, bipolar type ([place])   [place], Barbaraann Faster, NP       Future Appointments             In 2 months Cannady, Barbaraann Faster, NP [facility]

## 2022-11-12 ENCOUNTER — Ambulatory Visit (INDEPENDENT_AMBULATORY_CARE_PROVIDER_SITE_OTHER): Payer: 59 | Admitting: *Deleted

## 2022-11-12 DIAGNOSIS — Z0000 Encounter for general adult medical examination without abnormal findings: Secondary | ICD-10-CM | POA: Diagnosis not present

## 2022-11-12 NOTE — Progress Notes (Signed)
Subjective:   Rita [name] is a 61 y.o. female who presents for Medicare Annual (Subsequent) preventive examination.  I connected with  Rita [name] and her care giver [name]  on 11/12/22 by a telephone enabled telemedicine application and verified that I am speaking with the correct person using two identifiers.   I discussed the limitations of evaluation and management by telemedicine. The patient expressed understanding and agreed to proceed.  Patient location: home  Provider location: Tele-health-home    Review of Systems     Cardiac Risk Factors include: advanced age (>6mn, >>21women);family history of premature cardiovascular disease;obesity (BMI >30kg/m2);hypertension     Objective:    Today's Vitals   There is no height or weight on file to calculate BMI.     11/12/2022    8:50 AM 10/24/2021   10:03 AM 10/23/2020    9:54 AM 10/20/2019    9:38 AM 12/17/2018    1:14 PM 07/02/2018    8:30 AM 07/02/2017    8:20 AM  Advanced Directives  Does Patient Have a Medical Advance Directive? Yes Yes Yes Yes  Yes Yes  Type of AIndustrial/product designerof AColemanLiving will Living will;Healthcare Power of ANew Port RicheyLiving will HCoto de CazaLiving will  Copy of HVandemerein Chart? Yes - validated most recent copy scanned in chart (See row information) Yes - validated most recent copy scanned in chart (See row information) No - copy requested Yes - validated most recent copy scanned in chart (See row information)  Yes Yes     Information is confidential and restricted. Go to Review Flowsheets to unlock data.    Current Medications (verified) Outpatient Encounter Medications as of 11/12/2022  Medication Sig   ACETAMINOPHEN EXTRA STRENGTH 500 MG tablet TAKE (2) TABLETS BY MOUTH EVERY SIX HOURS AS NEEDED.   aspirin 81 MG chewable tablet TAKE 1 TABLET BY  MOUTH ONCE DAILY.   carvedilol (COREG) 6.25 MG tablet Take 6.25 mg by mouth 2 (two) times daily.   cholecalciferol (VITAMIN D) 25 MCG (1000 UNIT) tablet TAKE (2) TABLETS BY MOUTH ONCE DAILY.   clonazePAM (KLONOPIN) 0.5 MG tablet Take 1 tablet (0.5 mg total) by mouth 2 (two) times daily.   diclofenac Sodium (VOLTAREN) 1 % GEL Apply 2 g topically 3 (three) times daily.   docusate sodium (COLACE) 100 MG capsule TAKE (1) CAPSULE BY MOUTH TWICE DAILY AS NEEDED.   DULoxetine (CYMBALTA) 30 MG capsule Take 1 capsule (30 mg total) by mouth at bedtime.   DULoxetine (CYMBALTA) 60 MG capsule TAKE (1) CAPSULE BY MOUTH ONCE DAILY.   GNP COUGH DM ER 30 MG/5ML liquid GIVE 2.5MLS BY MOUTH AS NEEDED FOR COUGH.   lisinopril (ZESTRIL) 5 MG tablet Take 1 tablet (5 mg total) by mouth daily.   magnesium oxide (MAG-OX) 400 (240 Mg) MG tablet Take 1 tablet (400 mg total) by mouth daily.   Multiple Vitamin (TAB-A-VITE) TABS Take 1 tablet by mouth daily.   omeprazole (PRILOSEC) 20 MG capsule TAKE (1) CAPSULE BY MOUTH ONCE DAILY.   QUEtiapine (SEROQUEL) 100 MG tablet Take 1 tablet (100 mg total) by mouth at bedtime.   SUMAtriptan (IMITREX) 100 MG tablet Take by mouth.   traZODone (DESYREL) 150 MG tablet TAKE (1) TABLET BY MOUTH DAILY AT BEDTIME.   rosuvastatin (CRESTOR) 40 MG tablet Take 1 tablet (40 mg total) by mouth daily. for cholesterol.  No facility-administered encounter medications on file as of 11/12/2022.    Allergies (verified) Advil [ibuprofen]   History: Past Medical History:  Diagnosis Date   Adenomatous polyps    Anxiety    Chronic constipation    Chronic tension headaches    CKD (chronic kidney disease) stage 3, GFR 30-59 ml/min (HCC)    Depression    Factitious disorder    GERD (gastroesophageal reflux disease)    Hyperlipidemia    Hypertension    Intellectual disability due to developmental disorder, unspecified    Mental retardation    Microscopic hematuria    Migraine    Obesity     Past Surgical History:  Procedure Laterality Date   CARPAL TUNNEL RELEASE     COLONOSCOPY WITH PROPOFOL N/A 06/27/2017   Procedure: COLONOSCOPY WITH PROPOFOL;  Surgeon: Manya Silvas, MD;  Location: [facility] ENDOSCOPY;  Service: Endoscopy;  Laterality: N/A;   COLONOSCOPY WITH PROPOFOL N/A 07/23/2022   Procedure: COLONOSCOPY WITH PROPOFOL;  Surgeon: Jonathon Bellows, MD;  Location: [facility] ENDOSCOPY;  Service: Gastroenterology;  Laterality: N/A;   CYSTOSCOPY W/ RETROGRADES Bilateral 11/15/2015   Procedure: CYSTOSCOPY WITH RETROGRADE PYELOGRAM;  Surgeon: Hollice Espy, MD;  Location: [facility] ORS;  Service: Urology;  Laterality: Bilateral;   US ECHOCARDIOGRAPHY  2012   showed mild heart failure (LVEF 52%); small pericardial effusion   Family History  Problem Relation Age of Onset   Heart disease Father    Hyperlipidemia Father    Kidney disease Father    Breast cancer Maternal Aunt    Prostate cancer Neg Hx    Social History   Socioeconomic History   Marital status: Single    Spouse name: Not on file   Number of children: Not on file   Years of education: Not on file   Highest education level: High school graduate  Occupational History   Occupation: student  Tobacco Use   Smoking status: Never   Smokeless tobacco: Never  Vaping Use   Vaping Use: Never used  Substance and Sexual Activity   Alcohol use: No   Drug use: No   Sexual activity: Never  Other Topics Concern   Not on file  Social History Narrative   Goes to [employer]   Social Determinants of Health   Financial Resource Strain: Low Risk  (11/12/2022)   Overall Financial Resource Strain (CARDIA)    Difficulty of Paying Living Expenses: Not hard at all  Food Insecurity: No Food Insecurity (11/12/2022)   Hunger Vital Sign    Worried About Running Out of Food in the Last Year: Never true    [place] in the Last Year: Never true  Transportation Needs: No Transportation Needs (11/12/2022)   PRAPARE - Armed forces logistics/support/administrative officer (Medical): No    Lack of Transportation (Non-Medical): No  Physical Activity: Inactive (11/12/2022)   Exercise Vital Sign    Days of Exercise per Week: 0 days    Minutes of Exercise per Session: 0 min  Stress: No Stress Concern Present (11/12/2022)   [place]    Feeling of Stress : Not at all  Social Connections: Moderately Integrated (11/12/2022)   Social Connection and Isolation Panel [NHANES]    Frequency of Communication with Friends and Family: More than three times a week    Frequency of Social Gatherings with Friends and Family: More than three times a week    Attends Religious Services: More than 4  times per year    Active Member of Clubs or Organizations: Yes    Attends Music therapist: More than 4 times per year    Marital Status: Never married    Tobacco Counseling Counseling given: Not Answered   Clinical Intake:  Pre-visit preparation completed: Yes  Pain : No/denies pain     Diabetes: No  How often do you need to have someone help you when you read instructions, pamphlets, or other written materials from your doctor or pharmacy?: 5 - Always  Diabetic?  no  Interpreter Needed?: No  Information entered by :: Leroy Kennedy LPN   Activities of Daily Living    11/12/2022    8:40 AM 01/01/2022   10:49 AM  In your present state of health, do you have any difficulty performing the following activities:  Hearing? 0 0  Vision? 0 0  Difficulty concentrating or making decisions? 1 0  Walking or climbing stairs? 0 0  Dressing or bathing? 0 0  Doing errands, shopping? 1 0  Preparing Food and eating ? N   Using the Toilet? N   In the past six months, have you accidently leaked urine? Y   Do you have problems with loss of bowel control? N   Managing your Medications? Y   Managing your Finances? Y   Housekeeping or managing your Housekeeping? Y     Patient  Care Team: Venita Lick, NP as PCP - General (Nurse Practitioner) Kathrine Haddock, NP as PCP - Family Medicine (Nurse Practitioner) Anabel Bene, MD as Referring Physician (Neurology) Clapacs, Madie Reno, MD (Psychiatry) Anthonette Legato, MD (Internal Medicine)  Indicate any recent Medical Services you may have received from other than [facility] providers in the past year (date may be approximate).     Assessment:   This is a routine wellness examination for [place].  Hearing/Vision screen Hearing Screening - Comments:: No trouble hearing Vision Screening - Comments:: [facility] Up to date  Dietary issues and exercise activities discussed: Current Exercise Habits: The patient does not participate in regular exercise at present   Goals Addressed             This Visit's Progress    Patient Stated   Not on track    10/23/2020, wants to lose weight     Weight (lb) < 200 lb (90.7 kg)         Depression Screen    11/12/2022    8:42 AM 05/28/2022   11:05 AM 01/01/2022   11:07 AM 10/24/2021   10:01 AM 03/20/2021    9:34 AM 10/23/2020    9:56 AM 09/13/2020    9:55 AM  PHQ 2/9 Scores  PHQ - 2 Score 0 0 0 0 0 0 0  PHQ- 9 Score 0 2 2  0 0 0    Fall Risk    11/12/2022    8:38 AM 05/28/2022   11:05 AM 01/01/2022   10:49 AM 10/24/2021    9:53 AM 10/23/2020    9:55 AM  Fall Risk   Falls in the past year? 0 0 0 0 0  Number falls in past yr: 0 0 0 0   Injury with Fall? 0 0 0 0   Risk for fall due to :   No Fall Risks  Medication side effect  Follow up Falls evaluation completed;Education provided;Falls prevention discussed  Falls evaluation completed Falls prevention discussed;Falls evaluation completed Falls evaluation completed;Education provided;Falls prevention discussed  FALL RISK PREVENTION PERTAINING TO THE HOME:  Any stairs in or around the home? No  If so, are there any without handrails? No  Home free of loose throw rugs in walkways, pet beds, electrical  cords, etc? Yes  Adequate lighting in your home to reduce risk of falls? Yes   ASSISTIVE DEVICES UTILIZED TO PREVENT FALLS:  Life alert? No  Use of a cane, walker or w/c? No  Grab bars in the bathroom? Yes  Shower chair or bench in shower? No  Elevated toilet seat or a handicapped toilet? Yes   TIMED UP AND GO:  Was the test performed? No .    Cognitive Function:        11/12/2022    8:43 AM 10/23/2020   10:01 AM 07/02/2018    8:31 AM 07/02/2017    8:24 AM  6CIT Screen  What Year? 0 points 0 points 0 points 0 points  What month? 0 points 0 points 0 points 0 points  What time? 0 points 3 points 0 points 0 points  Count back from 20 2 points 0 points 0 points 2 points  Months in reverse 4 points 0 points 0 points 0 points  Repeat phrase 4 points 4 points 0 points 2 points  Total Score 10 points 7 points 0 points 4 points    Immunizations Immunization History  Administered Date(s) Administered   Influenza,inj,Quad PF,6+ Mos 09/20/2015, 10/14/2016, 09/26/2017, 10/02/2018, 08/10/2019, 09/13/2020   Influenza-Unspecified 08/26/2014, 10/01/2021   Moderna Sars-Covid-2 Vaccination 12/17/2019, 01/14/2020, 10/02/2020, 10/01/2021   Td 10/20/2008, 03/26/2021   Zoster Recombinat (Shingrix) 02/02/2021, 05/04/2021    TDAP status: Up to date  Flu Vaccine status: Due, Education has been provided regarding the importance of this vaccine. Advised may receive this vaccine at local pharmacy or Health Dept. Aware to provide a copy of the vaccination record if obtained from local pharmacy or Health Dept. Verbalized acceptance and understanding.    Covid-19 vaccine status: Information provided on how to obtain vaccines.   Qualifies for Shingles Vaccine? No   Zostavax completed No   Shingrix Completed?: Yes  Screening Tests Health Maintenance  Topic Date Due   INFLUENZA VACCINE  07/02/2022   COVID-19 Vaccine (5 - 2023-24 season) 08/02/2022   Medicare Annual Wellness (AWV)  11/13/2023    MAMMOGRAM  08/12/2024   COLONOSCOPY (Pts 45-45yr Insurance coverage will need to be confirmed)  07/23/2029   DTaP/Tdap/Td (3 - Tdap) 03/27/2031   Hepatitis C Screening  Completed   HIV Screening  Completed   Zoster Vaccines- Shingrix  Completed   HPV VACCINES  Aged Out   PAP SMEAR-Modifier  Discontinued    Health Maintenance  Health Maintenance Due  Topic Date Due   INFLUENZA VACCINE  07/02/2022   COVID-19 Vaccine (5 - 2023-24 season) 08/02/2022    Colorectal cancer screening: Type of screening: Colonoscopy. Completed 22023. Repeat every 7 years  Mammogram status: Completed  . Repeat every year    Lung Cancer Screening: (Low Dose CT Chest recommended if Age 61-80years, 30 pack-year currently smoking OR have quit w/in 15years.) does not qualify.   Lung Cancer Screening Referral:   Additional Screening:  Hepatitis C Screening: does not qualify; Completed 2022  Vision Screening: Recommended annual ophthalmology exams for early detection of glaucoma and other disorders of the eye. Is the patient up to date with their annual eye exam?  Yes  Who is the provider or what is the name of the office in which the patient  attends annual eye exams? [facility] If pt is not established with a provider, would they like to be referred to a provider to establish care? No .   Dental Screening: Recommended annual dental exams for proper oral hygiene  Community Resource Referral / Chronic Care Management: CRR required this visit?  No   CCM required this visit?  No      Plan:     I have personally reviewed and noted the following in the patient's chart:   Medical and social history Use of alcohol, tobacco or illicit drugs  Current medications and supplements including opioid prescriptions. Patient is not currently taking opioid prescriptions. Functional ability and status Nutritional status Physical activity Advanced directives List of other physicians Hospitalizations,  surgeries, and ER visits in previous 12 months Vitals Screenings to include cognitive, depression, and falls Referrals and appointments  In addition, I have reviewed and discussed with patient certain preventive protocols, quality metrics, and best practice recommendations. A written personalized care plan for preventive services as well as general preventive health recommendations were provided to patient.     Leroy Kennedy, LPN   20/60/1561   Nurse Notes:

## 2022-11-12 NOTE — Patient Instructions (Signed)
[name] , Thank you for taking time to come for your Medicare Wellness Visit. I appreciate your ongoing commitment to your health goals. Please review the following plan we discussed and let me know if I can assist you in the future.   These are the goals we discussed:  Goals      DIET - INCREASE WATER INTAKE     Recommend continue to drink at least 6-8 glasses of water a day      Patient Stated     10/23/2020, wants to lose weight     Weight (lb) < 200 lb (90.7 kg)        This is a list of the screening recommended for you and due dates:  Health Maintenance  Topic Date Due   Flu Shot  07/02/2022   COVID-19 Vaccine (5 - 2023-24 season) 08/02/2022   Medicare Annual Wellness Visit  11/13/2023   Mammogram  08/12/2024   Colon Cancer Screening  07/23/2029   DTaP/Tdap/Td vaccine (3 - Tdap) 03/27/2031   Hepatitis C Screening: USPSTF Recommendation to screen - Ages 18-79 yo.  Completed   HIV Screening  Completed   Zoster (Shingles) Vaccine  Completed   HPV Vaccine  Aged Out   Pap Smear  Discontinued    Advanced directives: yes on file  Conditions/risks identified:   Next appointment: Follow up in one year for your annual wellness visit.   Preventive Care 40-64 Years, Female Preventive care refers to lifestyle choices and visits with your health care provider that can promote health and wellness. What does preventive care include? A yearly physical exam. This is also called an annual well check. Dental exams once or twice a year. Routine eye exams. Ask your health care provider how often you should have your eyes checked. Personal lifestyle choices, including: Daily care of your teeth and gums. Regular physical activity. Eating a healthy diet. Avoiding tobacco and drug use. Limiting alcohol use. Practicing safe sex. Taking low-dose aspirin daily starting at age 9. Taking vitamin and mineral supplements as recommended by your health care provider. What happens during an  annual well check? The services and screenings done by your health care provider during your annual well check will depend on your age, overall health, lifestyle risk factors, and family history of disease. Counseling  Your health care provider may ask you questions about your: Alcohol use. Tobacco use. Drug use. Emotional well-being. Home and relationship well-being. Sexual activity. Eating habits. Work and work Statistician. Method of birth control. Menstrual cycle. Pregnancy history. Screening  You may have the following tests or measurements: Height, weight, and BMI. Blood pressure. Lipid and cholesterol levels. These may be checked every 5 years, or more frequently if you are over 41 years old. Skin check. Lung cancer screening. You may have this screening every year starting at age 19 if you have a 30-pack-year history of smoking and currently smoke or have quit within the past 15 years. Fecal occult blood test (FOBT) of the stool. You may have this test every year starting at age 60. Flexible sigmoidoscopy or colonoscopy. You may have a sigmoidoscopy every 5 years or a colonoscopy every 10 years starting at age 46. Hepatitis C blood test. Hepatitis B blood test. Sexually transmitted disease (STD) testing. Diabetes screening. This is done by checking your blood sugar (glucose) after you have not eaten for a while (fasting). You may have this done every 1-3 years. Mammogram. This may be done every 1-2 years. Talk to your  health care provider about when you should start having regular mammograms. This may depend on whether you have a family history of breast cancer. BRCA-related cancer screening. This may be done if you have a family history of breast, ovarian, tubal, or peritoneal cancers. Pelvic exam and Pap test. This may be done every 3 years starting at age 50. Starting at age 53, this may be done every 5 years if you have a Pap test in combination with an HPV test. Bone  density scan. This is done to screen for osteoporosis. You may have this scan if you are at high risk for osteoporosis. Discuss your test results, treatment options, and if necessary, the need for more tests with your health care provider. Vaccines  Your health care provider may recommend certain vaccines, such as: Influenza vaccine. This is recommended every year. Tetanus, diphtheria, and acellular pertussis (Tdap, Td) vaccine. You may need a Td booster every 10 years. Zoster vaccine. You may need this after age 35. Pneumococcal 13-valent conjugate (PCV13) vaccine. You may need this if you have certain conditions and were not previously vaccinated. Pneumococcal polysaccharide (PPSV23) vaccine. You may need one or two doses if you smoke cigarettes or if you have certain conditions. Talk to your health care provider about which screenings and vaccines you need and how often you need them. This information is not intended to replace advice given to you by your health care provider. Make sure you discuss any questions you have with your health care provider. Document Released: 12/15/2015 Document Revised: 08/07/2016 Document Reviewed: 09/19/2015 Elsevier Interactive Patient Education  2017 [employer] Prevention in the Home Falls can cause injuries. They can happen to people of all ages. There are many things you can do to make your home safe and to help prevent falls. What can I do on the outside of my home? Regularly fix the edges of walkways and driveways and fix any cracks. Remove anything that might make you trip as you walk through a door, such as a raised step or threshold. Trim any bushes or trees on the path to your home. Use bright outdoor lighting. Clear any walking paths of anything that might make someone trip, such as rocks or tools. Regularly check to see if handrails are loose or broken. Make sure that both sides of any steps have handrails. Any raised decks and  porches should have guardrails on the edges. Have any leaves, snow, or ice cleared regularly. Use sand or salt on walking paths during winter. Clean up any spills in your garage right away. This includes oil or grease spills. What can I do in the bathroom? Use night lights. Install grab bars by the toilet and in the tub and shower. Do not use towel bars as grab bars. Use non-skid mats or decals in the tub or shower. If you need to sit down in the shower, use a plastic, non-slip stool. Keep the floor dry. Clean up any water that spills on the floor as soon as it happens. Remove soap buildup in the tub or shower regularly. Attach bath mats securely with double-sided non-slip rug tape. Do not have throw rugs and other things on the floor that can make you trip. What can I do in the bedroom? Use night lights. Make sure that you have a light by your bed that is easy to reach. Do not use any sheets or blankets that are too big for your bed. They should not hang down  onto the floor. Have a firm chair that has side arms. You can use this for support while you get dressed. Do not have throw rugs and other things on the floor that can make you trip. What can I do in the kitchen? Clean up any spills right away. Avoid walking on wet floors. Keep items that you use a lot in easy-to-reach places. If you need to reach something above you, use a strong step stool that has a grab bar. Keep electrical cords out of the way. Do not use floor polish or wax that makes floors slippery. If you must use wax, use non-skid floor wax. Do not have throw rugs and other things on the floor that can make you trip. What can I do with my stairs? Do not leave any items on the stairs. Make sure that there are handrails on both sides of the stairs and use them. Fix handrails that are broken or loose. Make sure that handrails are as long as the stairways. Check any carpeting to make sure that it is firmly attached to the  stairs. Fix any carpet that is loose or worn. Avoid having throw rugs at the top or bottom of the stairs. If you do have throw rugs, attach them to the floor with carpet tape. Make sure that you have a light switch at the top of the stairs and the bottom of the stairs. If you do not have them, ask someone to add them for you. What else can I do to help prevent falls? Wear shoes that: Do not have high heels. Have rubber bottoms. Are comfortable and fit you well. Are closed at the toe. Do not wear sandals. If you use a stepladder: Make sure that it is fully opened. Do not climb a closed stepladder. Make sure that both sides of the stepladder are locked into place. Ask someone to hold it for you, if possible. Clearly mark and make sure that you can see: Any grab bars or handrails. First and last steps. Where the edge of each step is. Use tools that help you move around (mobility aids) if they are needed. These include: Canes. Walkers. Scooters. Crutches. Turn on the lights when you go into a dark area. Replace any light bulbs as soon as they burn out. Set up your furniture so you have a clear path. Avoid moving your furniture around. If any of your floors are uneven, fix them. If there are any pets around you, be aware of where they are. Review your medicines with your doctor. Some medicines can make you feel dizzy. This can increase your chance of falling. Ask your doctor what other things that you can do to help prevent falls. This information is not intended to replace advice given to you by your health care provider. Make sure you discuss any questions you have with your health care provider. Document Released: 09/14/2009 Document Revised: 04/25/2016 Document Reviewed: 12/23/2014 Elsevier Interactive Patient Education  2017 [employer].

## 2022-12-17 ENCOUNTER — Telehealth: Payer: Self-pay | Admitting: Nurse Practitioner

## 2022-12-17 NOTE — Telephone Encounter (Signed)
Copied from [place] [id number]. Topic: General - Other >> Dec 17, 2022  2:26 PM [name] wrote: Reason for CRM: The patient's caregiver Ms Jeannene Patella has returned a call to provide additional vaccine information   Please contact further when possible

## 2022-12-17 NOTE — Telephone Encounter (Signed)
Called patient's caregiver [name] and told her to upload a picture on Mychart of her vaccinations

## 2022-12-23 ENCOUNTER — Other Ambulatory Visit: Payer: Self-pay | Admitting: Nurse Practitioner

## 2022-12-23 DIAGNOSIS — I10 Essential (primary) hypertension: Secondary | ICD-10-CM

## 2022-12-24 NOTE — Telephone Encounter (Signed)
Unable to refill per protocol, Rx request is too soon. Last refill 01/01/22 for 90 and 4 refills.  Requested Prescriptions  Pending Prescriptions Disp Refills   lisinopril (ZESTRIL) 5 MG tablet [Pharmacy Med Name: lisinopril 5 mg tablet] 30 tablet 11    Sig: TAKE ONE TABLET BY MOUTH EVERY DAY     Cardiovascular:  ACE Inhibitors Failed - 12/23/2022  8:00 AM      Failed - Cr in normal range and within 180 days    Creatinine  Date Value Ref Range Status  12/28/2013 1.36 (H) 0.60 - 1.30 mg/dL Final   Creatinine, Ser  Date Value Ref Range Status  09/13/2020 1.52 (H) 0.57 - 1.00 mg/dL Final         Failed - K in normal range and within 180 days    Potassium  Date Value Ref Range Status  09/13/2020 4.7 3.5 - 5.2 mmol/L Final  12/28/2013 3.8 3.5 - 5.1 mmol/L Final         Passed - Patient is not pregnant      Passed - Last BP in normal range    BP Readings from Last 1 Encounters:  07/23/22 124/70         Passed - Valid encounter within last 6 months    Recent Outpatient Visits           7 months ago Stage 3b chronic kidney disease ([place])   Rita [name], Rita [name], Rita [name]   11 months ago Schizoaffective disorder, bipolar type ([place])   Rita [name], Rita [name], Rita [name]   1 year ago Rita [name], Rita [name], Rita [name]   1 year ago Schizoaffective disorder, bipolar type ([place])   Rita [name], Rita [name], Rita [name]   2 years ago Schizoaffective disorder, bipolar type ([place])   Rita [name], Rita [name], Rita [name]       Future Appointments             In 1 week Rita [name], Rita [name], Rita [name], Rita [name]             rosuvastatin (CRESTOR) 40 MG tablet [Pharmacy Med Name: rosuvastatin 40 mg tablet] 30 tablet 11    Sig: TAKE ONE TABLET BY MOUTH EVERY DAY FOR CHOLESTEROL     Cardiovascular:  Antilipid - Statins 2 Failed -  12/23/2022  8:00 AM      Failed - Cr in normal range and within 360 days    Creatinine  Date Value Ref Range Status  12/28/2013 1.36 (H) 0.60 - 1.30 mg/dL Final   Creatinine, Ser  Date Value Ref Range Status  09/13/2020 1.52 (H) 0.57 - 1.00 mg/dL Final         Failed - Lipid Panel in normal range within the last 12 months    Cholesterol, Total  Date Value Ref Range Status  05/28/2022 158 100 - 199 mg/dL Final   Cholesterol  Date Value Ref Range Status  09/30/2012 189 0 - 200 mg/dL Final   Cholesterol Piccolo, Waived  Date Value Ref Range Status  04/25/2020 189 <200 mg/dL Final    Comment:                            Desirable                <200  Borderline High      200- 239                         High                     >239    Ldl Cholesterol, Calc  Date Value Ref Range Status  09/30/2012 101 (H) 0 - 100 mg/dL Final   LDL Chol Calc (NIH)  Date Value Ref Range Status  05/28/2022 71 0 - 99 mg/dL Final   LDL Direct  Date Value Ref Range Status  07/14/2018 129 (H) 0 - 99 mg/dL Final   HDL Cholesterol  Date Value Ref Range Status  09/30/2012 61 (H) 40 - 60 mg/dL Final   HDL  Date Value Ref Range Status  05/28/2022 47 >39 mg/dL Final   Triglycerides  Date Value Ref Range Status  05/28/2022 250 (H) 0 - 149 mg/dL Final  09/30/2012 136 0 - 200 mg/dL Final   Triglycerides Piccolo,Waived  Date Value Ref Range Status  04/25/2020 310 (H) <150 mg/dL Final    Comment:                            Normal                   <150                         Borderline High     150 - 199                         High                200 - 499                         Very High                >499          Passed - Patient is not pregnant      Passed - Valid encounter within last 12 months    Recent Outpatient Visits           7 months ago Stage 3b chronic kidney disease ([place])   Rita [name], Rita [name], Rita [name]   11  months ago Schizoaffective disorder, bipolar type ([place])   Rita [name], Rita [name], Rita [name]   1 year ago Rita [name], Rita [name]   1 year ago Schizoaffective disorder, bipolar type [facility])   Rita [name], Rita [name], Rita [name]   2 years ago Schizoaffective disorder, bipolar type ([place])   Rita [name], Rita [name], Rita [name]       Future Appointments             In 1 week Rita [name], Rita [name], Rita [name] Rita [name], Rita [name]

## 2022-12-28 ENCOUNTER — Other Ambulatory Visit: Payer: Self-pay | Admitting: Nurse Practitioner

## 2022-12-28 DIAGNOSIS — I10 Essential (primary) hypertension: Secondary | ICD-10-CM

## 2022-12-29 NOTE — Patient Instructions (Signed)

## 2022-12-30 NOTE — Telephone Encounter (Signed)
Requested Prescriptions  Pending Prescriptions Disp Refills   magnesium oxide (MAG-OX) 400 (240 Mg) MG tablet [Pharmacy Med Name: magnesium oxide 400 mg (241.3 mg magnesium) tablet] 30 tablet 0    Sig: TAKE ONE TABLET BY [place] DAY     Endocrinology:  Minerals - Magnesium Supplementation Failed - 12/28/2022 11:29 AM      Failed - Mg Level in normal range and within 360 days    Magnesium  Date Value Ref Range Status  01/01/2022 2.2 1.6 - 2.3 mg/dL Final  12/28/2013 1.7 (L) mg/dL Final    Comment:    1.8-2.4 THERAPEUTIC RANGE: 4-7 mg/dL TOXIC: > 10 mg/dL  -----------------------          Failed - Cr in normal range and within 360 days    Creatinine  Date Value Ref Range Status  12/28/2013 1.36 (H) 0.60 - 1.30 mg/dL Final   Creatinine, Ser  Date Value Ref Range Status  09/13/2020 1.52 (H) 0.57 - 1.00 mg/dL Final         Passed - Valid encounter within last 12 months    Recent Outpatient Visits           7 months ago Stage 3b chronic kidney disease ([place])   [place], Henrine Screws T, NP   12 months ago Schizoaffective disorder, bipolar type ([place])   [place], Henrine Screws T, NP   1 year ago [place], Santiago Glad, NP   1 year ago Schizoaffective disorder, bipolar type ([place])   [place], Henrine Screws T, NP   2 years ago Schizoaffective disorder, bipolar type ([place])   [place], Henrine Screws T, NP       Future Appointments             In 3 days Cannady, Barbaraann Faster, NP [place], [facility]             rosuvastatin (CRESTOR) 40 MG tablet [Pharmacy Med Name: rosuvastatin 40 mg tablet] 30 tablet 0    Sig: TAKE ONE TABLET BY MOUTH EVERY DAY FOR CHOLESTEROL     Cardiovascular:  Antilipid - Statins 2 Failed - 12/28/2022 11:29 AM      Failed - Cr in normal range and within 360 days     Creatinine  Date Value Ref Range Status  12/28/2013 1.36 (H) 0.60 - 1.30 mg/dL Final   Creatinine, Ser  Date Value Ref Range Status  09/13/2020 1.52 (H) 0.57 - 1.00 mg/dL Final         Failed - Lipid Panel in normal range within the last 12 months    Cholesterol, Total  Date Value Ref Range Status  05/28/2022 158 100 - 199 mg/dL Final   Cholesterol  Date Value Ref Range Status  09/30/2012 189 0 - 200 mg/dL Final   Cholesterol Piccolo, Waived  Date Value Ref Range Status  04/25/2020 189 <200 mg/dL Final    Comment:                            Desirable                <200                         Borderline High      200- 239  High                     >239    Ldl Cholesterol, Calc  Date Value Ref Range Status  09/30/2012 101 (H) 0 - 100 mg/dL Final   LDL Chol Calc (NIH)  Date Value Ref Range Status  05/28/2022 71 0 - 99 mg/dL Final   LDL Direct  Date Value Ref Range Status  07/14/2018 129 (H) 0 - 99 mg/dL Final   HDL Cholesterol  Date Value Ref Range Status  09/30/2012 61 (H) 40 - 60 mg/dL Final   HDL  Date Value Ref Range Status  05/28/2022 47 >39 mg/dL Final   Triglycerides  Date Value Ref Range Status  05/28/2022 250 (H) 0 - 149 mg/dL Final  09/30/2012 136 0 - 200 mg/dL Final   Triglycerides Piccolo,Waived  Date Value Ref Range Status  04/25/2020 310 (H) <150 mg/dL Final    Comment:                            Normal                   <150                         Borderline High     150 - 199                         High                200 - 499                         Very High                >499          Passed - Patient is not pregnant      Passed - Valid encounter within last 12 months    Recent Outpatient Visits           7 months ago Stage 3b chronic kidney disease ([place])   [place], [place] T, NP   12 months ago Schizoaffective disorder, bipolar type ([place])   [place], Henrine Screws T, NP   1 year ago [place] Jon Billings, NP   1 year ago Schizoaffective disorder, bipolar type ([place])   [place], Henrine Screws T, NP   2 years ago Schizoaffective disorder, bipolar type ([place])   [place], Barbaraann Faster, NP       Future Appointments             In 3 days Cannady, Henrine Screws T, NP [place], [facility]             lisinopril (ZESTRIL) 5 MG tablet [Pharmacy Med Name: lisinopril 5 mg tablet] 30 tablet 0    Sig: TAKE ONE TABLET BY MOUTH EVERY DAY     Cardiovascular:  ACE Inhibitors Failed - 12/28/2022 11:29 AM      Failed - Cr in normal range and within 180 days    Creatinine  Date Value Ref Range Status  12/28/2013 1.36 (H) 0.60 - 1.30 mg/dL Final   Creatinine, Ser  Date Value Ref Range Status  09/13/2020 1.52 (H) 0.57 - 1.00 mg/dL Final         Failed - K in normal range and within 180 days    Potassium  Date Value Ref Range Status  09/13/2020 4.7 3.5 - 5.2 mmol/L Final  12/28/2013 3.8 3.5 - 5.1 mmol/L Final         Passed - Patient is not pregnant      Passed - Last BP in normal range    BP Readings from Last 1 Encounters:  07/23/22 124/70         Passed - Valid encounter within last 6 months    Recent Outpatient Visits           7 months ago Stage 3b chronic kidney disease ([place])   [place], Henrine Screws T, NP   12 months ago Schizoaffective disorder, bipolar type ([place])   [place], Henrine Screws T, NP   1 year ago [place] Jon Billings, NP   1 year ago Schizoaffective disorder, bipolar type [facility])   [place], Henrine Screws T, NP   2 years ago Schizoaffective disorder, bipolar type ([place])   [place], Barbaraann Faster, NP       Future  Appointments             In 3 days Cannady, Barbaraann Faster, NP [place], [facility]

## 2022-12-30 NOTE — Telephone Encounter (Signed)
Requested medication (s) are due for refill today - expired   Requested medication (s) are on the active medication list - yes  Future visit scheduled -yes  Last refill: Rx 11/02/21 #[id number]  Notes to clinic: expired Rx  Requested Prescriptions  Pending Prescriptions Disp Refills   magnesium oxide (MAG-OX) 400 (240 Mg) MG tablet [Pharmacy Med Name: magnesium oxide 400 mg (241.3 mg magnesium) tablet] 30 tablet 0    Sig: [place] DAY     Endocrinology:  Minerals - Magnesium Supplementation Failed - 12/28/2022 11:29 AM      Failed - Mg Level in normal range and within 360 days    Magnesium  Date Value Ref Range Status  01/01/2022 2.2 1.6 - 2.3 mg/dL Final  12/28/2013 1.7 (L) mg/dL Final    Comment:    1.8-2.4 THERAPEUTIC RANGE: 4-7 mg/dL TOXIC: > 10 mg/dL  -----------------------          Failed - Cr in normal range and within 360 days    Creatinine  Date Value Ref Range Status  12/28/2013 1.36 (H) 0.60 - 1.30 mg/dL Final   Creatinine, Ser  Date Value Ref Range Status  09/13/2020 1.52 (H) 0.57 - 1.00 mg/dL Final         Passed - Valid encounter within last 12 months    Recent Outpatient Visits           7 months ago Stage 3b chronic kidney disease ([place])   Rita [name], Rita [name], Rita [name]   12 months ago Schizoaffective disorder, bipolar type ([place])   [place], Henrine Screws [name], Rita [name]   1 year ago [place], Santiago Glad, Rita [name]   1 year ago Schizoaffective disorder, bipolar type ([place])   [place], Henrine Screws [name], Rita [name]   2 years ago Schizoaffective disorder, bipolar type ([place])   [place], Henrine Screws [name], Rita [name]       Future Appointments             In 3 days [place], Rita [name] [place], [facility]            Signed Prescriptions Disp Refills   rosuvastatin (CRESTOR) 40 MG  tablet 30 tablet 0    Sig: TAKE ONE TABLET BY MOUTH EVERY DAY FOR CHOLESTEROL     Cardiovascular:  Antilipid - Statins 2 Failed - 12/28/2022 11:29 AM      Failed - Cr in normal range and within 360 days    Creatinine  Date Value Ref Range Status  12/28/2013 1.36 (H) 0.60 - 1.30 mg/dL Final   Creatinine, Ser  Date Value Ref Range Status  09/13/2020 1.52 (H) 0.57 - 1.00 mg/dL Final         Failed - Lipid Panel in normal range within the last 12 months    Cholesterol, Total  Date Value Ref Range Status  05/28/2022 158 100 - 199 mg/dL Final   Cholesterol  Date Value Ref Range Status  09/30/2012 189 0 - 200 mg/dL Final   Cholesterol Piccolo, Waived  Date Value Ref Range Status  04/25/2020 189 <200 mg/dL Final    Comment:                            Desirable                <  200                         Borderline High      200- 239                         High                     >239    Ldl Cholesterol, Calc  Date Value Ref Range Status  09/30/2012 101 (H) 0 - 100 mg/dL Final   LDL Chol Calc (NIH)  Date Value Ref Range Status  05/28/2022 71 0 - 99 mg/dL Final   LDL Direct  Date Value Ref Range Status  07/14/2018 129 (H) 0 - 99 mg/dL Final   HDL Cholesterol  Date Value Ref Range Status  09/30/2012 61 (H) 40 - 60 mg/dL Final   HDL  Date Value Ref Range Status  05/28/2022 47 >39 mg/dL Final   Triglycerides  Date Value Ref Range Status  05/28/2022 250 (H) 0 - 149 mg/dL Final  09/30/2012 136 0 - 200 mg/dL Final   Triglycerides Piccolo,Waived  Date Value Ref Range Status  04/25/2020 310 (H) <150 mg/dL Final    Comment:                            Normal                   <150                         Borderline High     150 - 199                         High                200 - 499                         Very High                >499          Passed - Patient is not pregnant      Passed - Valid encounter within last 12 months    Recent Outpatient Visits            7 months ago Stage 3b chronic kidney disease ([place])   [place], [place] [name], Rita [name]   12 months ago Schizoaffective disorder, bipolar type ([place])   [place], Henrine Screws [name], Rita [name]   1 year ago [place] Jon Billings, Rita [name]   1 year ago Schizoaffective disorder, bipolar type ([place])   [place], Henrine Screws [name], Rita [name]   2 years ago Schizoaffective disorder, bipolar type ([place])   [place], Barbaraann Faster, Rita [name]       Future Appointments             In 3 days Cannady, Barbaraann Faster, Rita [name] [place], [facility]             lisinopril (ZESTRIL) 5 MG tablet 30 tablet 0    Sig: TAKE ONE TABLET BY MOUTH EVERY DAY     Cardiovascular:  ACE Inhibitors Failed - 12/28/2022 11:29 AM      Failed - Cr in normal range and within 180 days    Creatinine  Date Value Ref Range Status  12/28/2013 1.36 (H) 0.60 - 1.30 mg/dL Final   Creatinine, Ser  Date Value Ref Range Status  09/13/2020 1.52 (H) 0.57 - 1.00 mg/dL Final         Failed - K in normal range and within 180 days    Potassium  Date Value Ref Range Status  09/13/2020 4.7 3.5 - 5.2 mmol/L Final  12/28/2013 3.8 3.5 - 5.1 mmol/L Final         Passed - Patient is not pregnant      Passed - Last BP in normal range    BP Readings from Last 1 Encounters:  07/23/22 124/70         Passed - Valid encounter within last 6 months    Recent Outpatient Visits           7 months ago Stage 3b chronic kidney disease ([place])   [place], Henrine Screws [name], Rita [name]   12 months ago Schizoaffective disorder, bipolar type ([place])   [place], [place] [name], Rita [name]   1 year ago [place], Santiago Glad, Rita [name]   1 year ago Schizoaffective disorder, bipolar type ([place])   [place], Henrine Screws [name], Rita [name]   2 years ago Schizoaffective disorder, bipolar type ([place])   [place], Henrine Screws [name], Rita [name]       Future Appointments             In 3 days Cannady, Barbaraann Faster, Rita [name] [place], [facility]               Requested Prescriptions  Pending Prescriptions Disp Refills   magnesium oxide (MAG-OX) 400 (240 Mg) MG tablet [Pharmacy Med Name: magnesium oxide 400 mg (241.3 mg magnesium) tablet] 30 tablet 0    Sig: [place] DAY     Endocrinology:  Minerals - Magnesium Supplementation Failed - 12/28/2022 11:29 AM      Failed - Mg Level in normal range and within 360 days    Magnesium  Date Value Ref Range Status  01/01/2022 2.2 1.6 - 2.3 mg/dL Final  12/28/2013 1.7 (L) mg/dL Final    Comment:    1.8-2.4 THERAPEUTIC RANGE: 4-7 mg/dL TOXIC: > 10 mg/dL  -----------------------          Failed - Cr in normal range and within 360 days    Creatinine  Date Value Ref Range Status  12/28/2013 1.36 (H) 0.60 - 1.30 mg/dL Final   Creatinine, Ser  Date Value Ref Range Status  09/13/2020 1.52 (H) 0.57 - 1.00 mg/dL Final         Passed - Valid encounter within last 12 months    Recent Outpatient Visits           7 months ago Stage 3b chronic kidney disease ([place])   [place], Henrine Screws [name], Rita [name]   12 months ago Schizoaffective disorder, bipolar type ([place])   [place] Venita Lick, Rita [name]   1 year ago [place] Jon Billings, Rita [name]   1 year ago Schizoaffective disorder, bipolar type [facility])   [place],  Barbaraann Faster, Rita [name]   2 years ago Schizoaffective disorder, bipolar type ([place])   [place], Barbaraann Faster, Rita [name]       Future Appointments             In 3 days Cannady, Barbaraann Faster, Rita [name] [place], [facility]             Signed Prescriptions Disp Refills   rosuvastatin (CRESTOR) 40 MG tablet 30 tablet 0    Sig: TAKE ONE TABLET BY MOUTH EVERY DAY FOR CHOLESTEROL     Cardiovascular:  Antilipid - Statins 2 Failed - 12/28/2022 11:29 AM      Failed - Cr in normal range and within 360 days    Creatinine  Date Value Ref Range Status  12/28/2013 1.36 (H) 0.60 - 1.30 mg/dL Final   Creatinine, Ser  Date Value Ref Range Status  09/13/2020 1.52 (H) 0.57 - 1.00 mg/dL Final         Failed - Lipid Panel in normal range within the last 12 months    Cholesterol, Total  Date Value Ref Range Status  05/28/2022 158 100 - 199 mg/dL Final   Cholesterol  Date Value Ref Range Status  09/30/2012 189 0 - 200 mg/dL Final   Cholesterol Piccolo, Waived  Date Value Ref Range Status  04/25/2020 189 <200 mg/dL Final    Comment:                            Desirable                <200                         Borderline High      200- 239                         High                     >239    Ldl Cholesterol, Calc  Date Value Ref Range Status  09/30/2012 101 (H) 0 - 100 mg/dL Final   LDL Chol Calc (NIH)  Date Value Ref Range Status  05/28/2022 71 0 - 99 mg/dL Final   LDL Direct  Date Value Ref Range Status  07/14/2018 129 (H) 0 - 99 mg/dL Final   HDL Cholesterol  Date Value Ref Range Status  09/30/2012 61 (H) 40 - 60 mg/dL Final   HDL  Date Value Ref Range Status  05/28/2022 47 >39 mg/dL Final   Triglycerides  Date Value Ref Range Status  05/28/2022 250 (H) 0 - 149 mg/dL Final  09/30/2012 136 0 - 200 mg/dL Final   Triglycerides Piccolo,Waived  Date Value Ref Range Status  04/25/2020 310 (H) <150 mg/dL Final    Comment:                            Normal                   <150                         Borderline High     150 - 199  High                200 - 499                         Very High                >499          Passed - Patient is not pregnant      Passed -  Valid encounter within last 12 months    Recent Outpatient Visits           7 months ago Stage 3b chronic kidney disease ([place])   [place], Rita [name], Rita [name]   12 months ago Schizoaffective disorder, bipolar type ([place])   [place], Henrine Screws [name], Rita [name]   1 year ago [place], Santiago Glad, Rita [name]   1 year ago Schizoaffective disorder, bipolar type ([place])   [place], Henrine Screws [name], Rita [name]   2 years ago Schizoaffective disorder, bipolar type ([place])   [place], Henrine Screws [name], Rita [name]       Future Appointments             In 3 days Cannady, Barbaraann Faster, Rita [name] [place], [facility]             lisinopril (ZESTRIL) 5 MG tablet 30 tablet 0    Sig: TAKE ONE TABLET BY MOUTH EVERY DAY     Cardiovascular:  ACE Inhibitors Failed - 12/28/2022 11:29 AM      Failed - Cr in normal range and within 180 days    Creatinine  Date Value Ref Range Status  12/28/2013 1.36 (H) 0.60 - 1.30 mg/dL Final   Creatinine, Ser  Date Value Ref Range Status  09/13/2020 1.52 (H) 0.57 - 1.00 mg/dL Final         Failed - K in normal range and within 180 days    Potassium  Date Value Ref Range Status  09/13/2020 4.7 3.5 - 5.2 mmol/L Final  12/28/2013 3.8 3.5 - 5.1 mmol/L Final         Passed - Patient is not pregnant      Passed - Last BP in normal range    BP Readings from Last 1 Encounters:  07/23/22 124/70         Passed - Valid encounter within last 6 months    Recent Outpatient Visits           7 months ago Stage 3b chronic kidney disease ([place])   [place], Henrine Screws [name], Rita [name]   12 months ago Schizoaffective disorder, bipolar type ([place])   [place], Barbaraann Faster, Rita [name]   1 year ago [place], Karen, Rita [name]   1 year ago  Schizoaffective disorder, bipolar type [facility])   [place], Henrine Screws [name], Rita [name]   2 years ago Schizoaffective disorder, bipolar type ([place])   [place], Barbaraann Faster, Rita [name]       Future Appointments             In 3 days Cannady, Barbaraann Faster, Rita [name] [place], [facility]

## 2023-01-02 ENCOUNTER — Ambulatory Visit (INDEPENDENT_AMBULATORY_CARE_PROVIDER_SITE_OTHER): Payer: 59 | Admitting: Nurse Practitioner

## 2023-01-02 ENCOUNTER — Encounter: Payer: Self-pay | Admitting: Nurse Practitioner

## 2023-01-02 VITALS — BP 106/72 | HR 74 | Temp 97.9°F | Ht 59.49 in | Wt 229.7 lb

## 2023-01-02 DIAGNOSIS — E782 Mixed hyperlipidemia: Secondary | ICD-10-CM

## 2023-01-02 DIAGNOSIS — Z0000 Encounter for general adult medical examination without abnormal findings: Secondary | ICD-10-CM | POA: Diagnosis not present

## 2023-01-02 DIAGNOSIS — R7301 Impaired fasting glucose: Secondary | ICD-10-CM

## 2023-01-02 DIAGNOSIS — G43009 Migraine without aura, not intractable, without status migrainosus: Secondary | ICD-10-CM

## 2023-01-02 DIAGNOSIS — N1832 Chronic kidney disease, stage 3b: Secondary | ICD-10-CM | POA: Diagnosis not present

## 2023-01-02 DIAGNOSIS — K2100 Gastro-esophageal reflux disease with esophagitis, without bleeding: Secondary | ICD-10-CM

## 2023-01-02 DIAGNOSIS — F250 Schizoaffective disorder, bipolar type: Secondary | ICD-10-CM | POA: Diagnosis not present

## 2023-01-02 DIAGNOSIS — Z23 Encounter for immunization: Secondary | ICD-10-CM

## 2023-01-02 DIAGNOSIS — E210 Primary hyperparathyroidism: Secondary | ICD-10-CM

## 2023-01-02 DIAGNOSIS — K5909 Other constipation: Secondary | ICD-10-CM

## 2023-01-02 DIAGNOSIS — I10 Essential (primary) hypertension: Secondary | ICD-10-CM

## 2023-01-02 DIAGNOSIS — E559 Vitamin D deficiency, unspecified: Secondary | ICD-10-CM

## 2023-01-02 DIAGNOSIS — F819 Developmental disorder of scholastic skills, unspecified: Secondary | ICD-10-CM

## 2023-01-02 LAB — BAYER DCA HB A1C WAIVED: HB A1C (BAYER DCA - WAIVED): 6.1 % — ABNORMAL HIGH (ref 4.8–5.6)

## 2023-01-02 LAB — MICROALBUMIN, URINE WAIVED
Creatinine, Urine Waived: 300 mg/dL (ref 10–300)
Microalb, Ur Waived: 80 mg/L — ABNORMAL HIGH (ref 0–19)
Microalb/Creat Ratio: <30 mg/g (ref <30)

## 2023-01-02 MED ORDER — ACETAMINOPHEN EXTRA STRENGTH 500 MG PO TABS: ORAL_TABLET | ORAL | 4 refills | Status: AC

## 2023-01-02 MED ORDER — LISINOPRIL 5 MG PO TABS: 5.0000 mg | ORAL_TABLET | Freq: Every day | ORAL | 4 refills | Status: DC

## 2023-01-02 MED ORDER — ASPIRIN 81 MG PO CHEW: 81.0000 mg | CHEWABLE_TABLET | Freq: Every day | ORAL | 12 refills | Status: DC

## 2023-01-02 MED ORDER — ROSUVASTATIN CALCIUM 40 MG PO TABS: 40.0000 mg | ORAL_TABLET | Freq: Every day | ORAL | 4 refills | Status: DC

## 2023-01-02 MED ORDER — DICLOFENAC SODIUM 1 % EX GEL: 2.0000 g | Freq: Three times a day (TID) | CUTANEOUS | 4 refills | Status: DC

## 2023-01-02 MED ORDER — MAGNESIUM OXIDE -MG SUPPLEMENT 400 (240 MG) MG PO TABS: 1.0000 | ORAL_TABLET | Freq: Every day | ORAL | 12 refills | Status: DC

## 2023-01-02 MED ORDER — OMEPRAZOLE 20 MG PO CPDR: DELAYED_RELEASE_CAPSULE | ORAL | 4 refills | Status: DC

## 2023-01-02 MED ORDER — DOCUSATE SODIUM 100 MG PO CAPS: ORAL_CAPSULE | ORAL | 4 refills | Status: DC

## 2023-01-02 MED ORDER — VITAMIN D3 25 MCG (1000 UNIT) PO TABS: ORAL_TABLET | ORAL | 4 refills | Status: DC

## 2023-01-02 NOTE — Assessment & Plan Note (Signed)
Chronic, ongoing.  Continue current medication regimen and adjust as needed.  Lipid panel today with LFTs.

## 2023-01-02 NOTE — Assessment & Plan Note (Signed)
Chronic, stable, followed by neurology. Continue this collaboration.  Recent notes reviewed -- continue medication as ordered by them.

## 2023-01-02 NOTE — Assessment & Plan Note (Signed)
Baseline, continue to monitor.  Lives in stable group home setting.  Will write script for adult pull-up briefs for patient when needed.

## 2023-01-02 NOTE — Assessment & Plan Note (Signed)
BMI 45.64 with HTN, CKD.  Recommended eating smaller high protein, low fat meals more frequently and exercising 30 mins a day 5 times a week with a goal of 10-15lb weight loss in the next 3 months. Patient voiced their understanding and motivation to adhere to these recommendations.

## 2023-01-02 NOTE — Assessment & Plan Note (Signed)
Chronic, stable, followed by psychiatry.  Continue current medication regimen as prescribed by them, recent note reviewed.  Denies SI/HI.

## 2023-01-02 NOTE — Progress Notes (Signed)
BP 106/72   Pulse 74   Temp 97.9 F (36.6 C) (Oral)   Ht 4' 11.49" (1.511 m)   Wt 229 lb 11.2 oz (104.2 kg)   LMP  (LMP Unknown)   SpO2 97%   BMI 45.64 kg/m    Subjective:    Patient ID: Rita [name], female    DOB: 09-24-1961, 62 y.o.   MRN: [id number]  HPI: Rita [name] is a 62 y.o. female presenting on 01/02/2023 for comprehensive medical examination. Current medical complaints include:none  She currently lives with: group home  Menopausal Symptoms: no  HYPERTENSION / HYPERLIPIDEMIA WITH CKD Visits with nephrology, Dr. Holley Raring, last 09/19/22, labs GFR 36, CRT 1.63, PTH 38.  She is cutting back on sodas.  Continues on Crestor, Lisinopril and Carvedilol + ASA.  Last saw neurology for migraine follow-up on 11/12/22. Continues on Imitrex -- she reports good control with this. Satisfied with current treatment? yes Duration of hypertension: chronic BP monitoring frequency: not checking BP range:  BP medication side effects: no Duration of hyperlipidemia: chronic Cholesterol medication side effects: no Cholesterol supplements: none Medication compliance: good compliance Aspirin: yes Recent stressors: no Recurrent headaches: no Visual changes: no Palpitations: no Dyspnea: no Chest pain: no Lower extremity edema: no Dizzy/lightheaded: no   GERD Continues on Omeprazole  daily. GERD control status: stable Satisfied with current treatment? yes Heartburn frequency: none Medication side effects: no  Medication compliance: stable Previous GERD medications: Dysphagia: no Odynophagia:  no Hematemesis: no Blood in stool: no EGD: no    IFG: No current medications, diet focused.  A1c 5.6% June. Taking Vitamin D daily for deficiency.  Does endorse enjoying sweets. Hypoglycemic episodes:no Polydipsia/polyuria: no Visual disturbance: no Chest pain: no Paresthesias: no    SCHIZOAFFECTIVE DISORDER Follows with psychiatry and last seen by Dr. Weber Cooks on 08/08/22.  She does  have learning disabilities and lives in stable group home.  Does attend therapy once a month, Tina. Mood status: stable Satisfied with current treatment?: yes Symptom severity: mild  Duration of current treatment : chronic Side effects: no Medication compliance: good compliance Psychotherapy/counseling: none Depressed mood: no Anxious mood: no Anhedonia: no Significant weight loss or gain: no Insomnia: none Fatigue: no Feelings of worthlessness or guilt: no Impaired concentration/indecisiveness: no Suicidal ideations: no Hopelessness: no Crying spells: no    01/02/2023    9:58 AM 11/12/2022    8:42 AM 05/28/2022   11:05 AM 01/01/2022   11:07 AM 10/24/2021   10:01 AM  Depression screen PHQ 2/9  Decreased Interest 0 0 0 0 0  Down, Depressed, Hopeless 0 0 0 0 0  PHQ - 2 Score 0 0 0 0 0  Altered sleeping 0 0 0 1   Tired, decreased energy 0 0 1 1   Change in appetite 0 0 0 0   Feeling bad or failure about yourself  0 0 0 0   Trouble concentrating 0 0 1 0   Moving slowly or fidgety/restless 0 0 0 0   Suicidal thoughts 0 0 0 0   PHQ-9 Score 0 0 2 2   Difficult doing work/chores Not difficult at all Not difficult at all Somewhat difficult Not difficult at all        01/02/2023    9:58 AM 05/28/2022   11:05 AM 01/01/2022   11:07 AM 07/14/2018    9:33 AM  GAD 7 : Generalized Anxiety Score  Nervous, Anxious, on Edge 0 0 1 0  Control/stop worrying 0  $'1 1 1  'X$ Worry too much - different things 0 0 1 1  Trouble relaxing 0 0 2 1  Restless 0 0 0 0  Easily annoyed or irritable 0 0 0 0  Afraid - awful might happen 0 0 0 0  Total GAD 7 Score 0 '1 5 3  '$ Anxiety Difficulty Not difficult at all Not difficult at all Not difficult at all       01/01/2022   10:49 AM 05/28/2022   11:05 AM 07/23/2022    9:25 AM 11/12/2022    8:38 AM 01/02/2023    9:59 AM  Fall Risk  Falls in the past year? 0 0  0 0  Was there an injury with Fall? 0 0  0 0  Fall Risk Category Calculator 0 0  0 0  Fall Risk  Category (Retired) Low Low  Low   (RETIRED) Patient Fall Risk Level Low fall risk Low fall risk Low fall risk Low fall risk   Patient at Risk for Falls Due to No Fall Risks    No Fall Risks  Fall risk Follow up Falls evaluation completed   Falls evaluation completed;Education provided;Falls prevention discussed Falls prevention discussed    Functional Status Survey: Is the patient deaf or have difficulty hearing?: No Does the patient have difficulty seeing, even when wearing glasses/contacts?: No Does the patient have difficulty concentrating, remembering, or making decisions?: No Does the patient have difficulty walking or climbing stairs?: No Does the patient have difficulty dressing or bathing?: No Does the patient have difficulty doing errands alone such as visiting a doctor's office or shopping?: No   Past Medical History:  Past Medical History:  Diagnosis Date   Adenomatous polyps    Anxiety    Chronic constipation    Chronic tension headaches    CKD (chronic kidney disease) stage 3, GFR 30-59 ml/min (HCC)    Depression    Factitious disorder    GERD (gastroesophageal reflux disease)    Hyperlipidemia    Hypertension    Intellectual disability due to developmental disorder, unspecified    Mental retardation    Microscopic hematuria    Migraine    Obesity     Surgical History:  Past Surgical History:  Procedure Laterality Date   CARPAL TUNNEL RELEASE     COLONOSCOPY WITH PROPOFOL N/A 06/27/2017   Procedure: COLONOSCOPY WITH PROPOFOL;  Surgeon: Manya Silvas, MD;  Location: [facility] ENDOSCOPY;  Service: Endoscopy;  Laterality: N/A;   COLONOSCOPY WITH PROPOFOL N/A 07/23/2022   Procedure: COLONOSCOPY WITH PROPOFOL;  Surgeon: Jonathon Bellows, MD;  Location: [facility] ENDOSCOPY;  Service: Gastroenterology;  Laterality: N/A;   CYSTOSCOPY W/ RETROGRADES Bilateral 11/15/2015   Procedure: CYSTOSCOPY WITH RETROGRADE PYELOGRAM;  Surgeon: Hollice Espy, MD;  Location: [facility] ORS;  Service:  Urology;  Laterality: Bilateral;   US ECHOCARDIOGRAPHY  2012   showed mild heart failure (LVEF 52%); small pericardial effusion    Medications:  Current Outpatient Medications on File Prior to Visit  Medication Sig   carvedilol (COREG) 6.25 MG tablet Take 6.25 mg by mouth 2 (two) times daily.   clonazePAM (KLONOPIN) 0.5 MG tablet Take 1 tablet (0.5 mg total) by mouth 2 (two) times daily.   DULoxetine (CYMBALTA) 30 MG capsule Take 1 capsule (30 mg total) by mouth at bedtime.   DULoxetine (CYMBALTA) 60 MG capsule TAKE (1) CAPSULE BY MOUTH ONCE DAILY.   GNP COUGH DM ER 30 MG/5ML liquid GIVE 2.5MLS BY MOUTH AS NEEDED FOR COUGH.  Multiple Vitamin (TAB-A-VITE) TABS Take 1 tablet by mouth daily.   QUEtiapine (SEROQUEL) 100 MG tablet Take 1 tablet (100 mg total) by mouth at bedtime.   SUMAtriptan (IMITREX) 100 MG tablet Take by mouth.   traZODone (DESYREL) 150 MG tablet TAKE (1) TABLET BY MOUTH DAILY AT BEDTIME.   No current facility-administered medications on file prior to visit.    Allergies:  Allergies  Allergen Reactions   Advil [Ibuprofen] Other (See Comments)    Reaction:  Gives her kidney problems     Social History:  Social History   Socioeconomic History   Marital status: Single    Spouse name: Not on file   Number of children: Not on file   Years of education: Not on file   Highest education level: High school graduate  Occupational History   Occupation: student  Tobacco Use   Smoking status: Never   Smokeless tobacco: Never  Vaping Use   Vaping Use: Never used  Substance and Sexual Activity   Alcohol use: No   Drug use: No   Sexual activity: Never  Other Topics Concern   Not on file  Social History Narrative   Goes to [employer]   Social Determinants of Health   Financial Resource Strain: Low Risk  (11/12/2022)   Overall Financial Resource Strain (CARDIA)    Difficulty of Paying Living Expenses: Not hard at all  Food Insecurity: No Food Insecurity (11/12/2022)    Hunger Vital Sign    Worried About Running Out of Food in the Last Year: Never true    [place] in the Last Year: Never true  Transportation Needs: No Transportation Needs (11/12/2022)   PRAPARE - Hydrologist (Medical): No    Lack of Transportation (Non-Medical): No  Physical Activity: Inactive (11/12/2022)   Exercise Vital Sign    Days of Exercise per Week: 0 days    Minutes of Exercise per Session: 0 min  Stress: No Stress Concern Present (11/12/2022)   [place]    Feeling of Stress : Not at all  Social Connections: Moderately Integrated (11/12/2022)   Social Connection and Isolation Panel [NHANES]    Frequency of Communication with Friends and Family: More than three times a week    Frequency of Social Gatherings with Friends and Family: More than three times a week    Attends Religious Services: More than 4 times per year    Active Member of [employer] or Organizations: Yes    Attends Archivist Meetings: More than 4 times per year    Marital Status: Never married  Intimate Partner Violence: Not At Risk (11/12/2022)   Humiliation, Afraid, Rape, and Kick questionnaire    Fear of Current or Ex-Partner: No    Emotionally Abused: No    Physically Abused: No    Sexually Abused: No   Social History   Tobacco Use  Smoking Status Never  Smokeless Tobacco Never   Social History   Substance and Sexual Activity  Alcohol Use No    Family History:  Family History  Problem Relation Age of Onset   Heart disease Father    Hyperlipidemia Father    Kidney disease Father    Breast cancer Maternal Aunt    Prostate cancer Neg Hx     Past medical history, surgical history, medications, allergies, family history and social history reviewed with patient today and changes made to appropriate areas of  the chart.   ROS All other ROS negative except what is listed above and in  the HPI.      Objective:    BP 106/72   Pulse 74   Temp 97.9 F (36.6 C) (Oral)   Ht 4' 11.49" (1.511 m)   Wt 229 lb 11.2 oz (104.2 kg)   LMP  (LMP Unknown)   SpO2 97%   BMI 45.64 kg/m   Wt Readings from Last 3 Encounters:  01/02/23 229 lb 11.2 oz (104.2 kg)  07/23/22 229 lb (103.9 kg)  05/28/22 231 lb (104.8 kg)    Physical Exam Vitals and nursing note reviewed. Exam conducted with a chaperone present.  Constitutional:      General: She is awake. She is not in acute distress.    Appearance: She is well-developed. She is not ill-appearing.  HENT:     Head: Normocephalic and atraumatic.     Right Ear: Hearing, tympanic membrane, ear canal and external ear normal. No drainage.     Left Ear: Hearing, tympanic membrane, ear canal and external ear normal. No drainage.     Nose: Nose normal.     Right Sinus: No maxillary sinus tenderness or frontal sinus tenderness.     Left Sinus: No maxillary sinus tenderness or frontal sinus tenderness.     Mouth/Throat:     Mouth: Mucous membranes are moist.     Pharynx: Oropharynx is clear. Uvula midline. No pharyngeal swelling, oropharyngeal exudate or posterior oropharyngeal erythema.  Eyes:     General: Lids are normal.        Right eye: No discharge.        Left eye: No discharge.     Extraocular Movements: Extraocular movements intact.     Conjunctiva/sclera: Conjunctivae normal.     Pupils: Pupils are equal, round, and reactive to light.     Visual Fields: Right eye visual fields normal and left eye visual fields normal.  Neck:     Thyroid: No thyromegaly.     Vascular: No carotid bruit.     Trachea: Trachea normal.  Cardiovascular:     Rate and Rhythm: Normal rate and regular rhythm.     Heart sounds: Normal heart sounds. No murmur heard.    No gallop.  Pulmonary:     Effort: Pulmonary effort is normal. No accessory muscle usage or respiratory distress.     Breath sounds: Normal breath sounds.  Chest:     Comments:  Deferred per patient request. Abdominal:     General: Bowel sounds are normal.     Palpations: Abdomen is soft. There is no hepatomegaly or splenomegaly.     Tenderness: There is no abdominal tenderness.  Musculoskeletal:        General: Normal range of motion.     Cervical back: Normal range of motion and neck supple.     Right lower leg: No edema.     Left lower leg: No edema.  Lymphadenopathy:     Head:     Right side of head: No submental, submandibular, tonsillar, preauricular or posterior auricular adenopathy.     Left side of head: No submental, submandibular, tonsillar, preauricular or posterior auricular adenopathy.     Cervical: No cervical adenopathy.  Skin:    General: Skin is warm and dry.     Capillary Refill: Capillary refill takes less than 2 seconds.     Findings: No rash.  Neurological:     Mental Status: She is alert and  oriented to person, place, and time.     Gait: Gait is intact.     Deep Tendon Reflexes: Reflexes are normal and symmetric.     Reflex Scores:      Brachioradialis reflexes are 2+ on the right side and 2+ on the left side.      Patellar reflexes are 2+ on the right side and 2+ on the left side. Psychiatric:        Attention and Perception: Attention normal.        Mood and Affect: Mood normal.        Speech: Speech normal.        Behavior: Behavior normal. Behavior is cooperative.        Thought Content: Thought content normal.        Judgment: Judgment normal.    Results for orders placed or performed during the hospital encounter of 07/23/22  Surgical pathology  Result Value Ref Range   SURGICAL PATHOLOGY      SURGICAL PATHOLOGY CASE: [id number] PATIENT: Jaydynn Gildner Surgical Pathology Report     Specimen Submitted: A. Colon polyp, ascending; cold snare  Clinical History: History of rectal polyps.  Colon polyp    DIAGNOSIS: A. COLON POLYP, ASCENDING; COLD SNARE: - TUBULAR ADENOMA. - NEGATIVE FOR HIGH-GRADE DYSPLASIA AND  MALIGNANCY.  GROSS DESCRIPTION: A. Labeled: Ascending colon polyp cold snare Received: Formalin Collection time: 10:02 AM on 07/23/2022 Placed into formalin time: 10:02 AM on 07/23/2022 Tissue fragment(s): 2 Size: Ranges from 0.1-0.3 cm Description: Tan soft tissue fragments Entirely submitted in 1 cassette.  CM 07/23/2022  Final Diagnosis performed by Allena Napoleon, MD.   Electronically signed 07/24/2022 10:32:22AM The electronic signature indicates that the named Attending Pathologist has evaluated the specimen Technical component performed at [facility], [address], [place], [place] [address] Lab: [phone] Dir: Rush Farmer, MD, MMM  Pr ofessional component performed at [facility], [facility], [place], [place], [place] [address] Lab: [phone] Dir: Kathi Simpers, MD       Assessment & Plan:   Problem List Items Addressed This Visit       Cardiovascular and Mediastinum   Essential hypertension    Chronic, stable. BP at goal in office today.  Continue current medication regimen and collaboration with nephrology.  Lisinopril for kidney protection, refills up to date.  Recommend checking BP at home on occasion and documenting.  Labs today: CBC, CMP, TSH, urine ALB.   Return in 6 months.        Relevant Medications   lisinopril (ZESTRIL) 5 MG tablet   aspirin 81 MG chewable tablet   rosuvastatin (CRESTOR) 40 MG tablet   Other Relevant Orders   Microalbumin, Urine Waived   CBC with Differential/Platelet   TSH   Headache, migraine    Chronic, stable, followed by neurology. Continue this collaboration.  Recent notes reviewed -- continue medication as ordered by them.      Relevant Medications   lisinopril (ZESTRIL) 5 MG tablet   aspirin 81 MG chewable tablet   Acetaminophen Extra Strength 500 MG TABS   rosuvastatin (CRESTOR) 40 MG tablet     Digestive   Chronic constipation    Chronic, ongoing, refills sent in on Colace.      Relevant  Medications   docusate sodium (COLACE) 100 MG capsule   GERD (gastroesophageal reflux disease)    Chronic, ongoing.  Stable with Omeprazole, has tried reductions without success.  Continue current medication regimen and adjust as needed.  Mag level today.      Relevant Medications   docusate sodium (COLACE) 100 MG capsule   omeprazole (PRILOSEC) 20 MG capsule   Other Relevant Orders   Magnesium     Endocrine   IFG (impaired fasting glucose)    Recommend continued diet focus, cutting back on soda and drinking more water + weight loss.  A1c trend up to 6.1% today.  No current medications.  Urine ALB 80 February 2024.      Relevant Orders   Bayer DCA Hb A1c Waived   Microalbumin, Urine Waived   Primary hyperparathyroidism ([place])    Ongoing per nephrology notes.  Continue collaboration with nephrology, recent note reviewed.  CMP on labs today.      Relevant Orders   Microalbumin, Urine Waived   Comprehensive metabolic panel     Genitourinary   Chronic kidney disease, stage 3 (HCC) - Primary    Chronic, stable.  Continue collaboration with nephrology and Lisinopril for kidney protection, refills sent and recent noted reviewed.  LABS: CMP and CBC + urine ALB today.      Relevant Orders   Microalbumin, Urine Waived   CBC with Differential/Platelet   Comprehensive metabolic panel   TSH     Other   Hyperlipidemia    Chronic, ongoing.  Continue current medication regimen and adjust as needed.  Lipid panel today with LFTs.      Relevant Medications   lisinopril (ZESTRIL) 5 MG tablet   aspirin 81 MG chewable tablet   rosuvastatin (CRESTOR) 40 MG tablet   Other Relevant Orders   Comprehensive metabolic panel   Lipid Panel w/o Chol/HDL Ratio   Learning disability    Baseline, continue to monitor.  Lives in stable group home setting.  Will write script for adult pull-up briefs for patient when needed.      Morbid obesity (HCC)    BMI 45.64 with HTN, CKD.  Recommended eating  smaller high protein, low fat meals more frequently and exercising 30 mins a day 5 times a week with a goal of 10-15lb weight loss in the next 3 months. Patient voiced their understanding and motivation to adhere to these recommendations.       Schizoaffective disorder, bipolar type (HCC)    Chronic, stable, followed by psychiatry.  Continue current medication regimen as prescribed by them, recent note reviewed.  Denies SI/HI.      Vitamin D deficiency    Ongoing, continue supplement and check level today.      Relevant Orders   VITAMIN D 25 Hydroxy (Vit-D Deficiency, Fractures)   Other Visit Diagnoses     Encounter for annual physical exam       Annual physical today with labs and health maintenance reviewed, discussed with patient.        Follow up plan: Return in about 6 months (around 07/03/2023) for CKD, MOOD, HTN/HLD, GERD.   LABORATORY TESTING:  - Pap smear: not applicable  IMMUNIZATIONS:   - Tdap: Tetanus vaccination status reviewed: last tetanus booster within 10 years. - Influenza: Up to date - Pneumovax: Not applicable - Prevnar: Not applicable - COVID: Up to date - HPV: Not applicable - Shingrix vaccine: Up to date  SCREENING: -Mammogram: Up to date in September 2023 - Colonoscopy: Up to date in August 2023 - Bone Density: Not applicable  -Hearing Test: Not applicable  -Spirometry: Not applicable   PATIENT COUNSELING:   Advised to take 1 mg of folate supplement per day if capable of  pregnancy.   Sexuality: Discussed sexually transmitted diseases, partner selection, use of condoms, avoidance of unintended pregnancy  and contraceptive alternatives.   Advised to avoid cigarette smoking.  I discussed with the patient that most people either abstain from alcohol or drink within safe limits (<=14/week and <=4 drinks/occasion for males, <=7/weeks and <= 3 drinks/occasion for females) and that the risk for alcohol disorders and other health effects rises  proportionally with the number of drinks per week and how often a drinker exceeds daily limits.  Discussed cessation/primary prevention of drug use and availability of treatment for abuse.   Diet: Encouraged to adjust caloric intake to maintain  or achieve ideal body weight, to reduce intake of dietary saturated fat and total fat, to limit sodium intake by avoiding high sodium foods and not adding table salt, and to maintain adequate dietary potassium and calcium preferably from fresh fruits, vegetables, and low-fat dairy products.    Stressed the importance of regular exercise  Injury prevention: Discussed safety belts, safety helmets, smoke detector, smoking near bedding or upholstery.   Dental health: Discussed importance of regular tooth brushing, flossing, and dental visits.    NEXT PREVENTATIVE PHYSICAL DUE IN 1 YEAR. Return in about 6 months (around 07/03/2023) for CKD, MOOD, HTN/HLD, GERD.

## 2023-01-02 NOTE — Assessment & Plan Note (Signed)
Ongoing, continue supplement and check level today.

## 2023-01-02 NOTE — Assessment & Plan Note (Signed)
Chronic, stable.  Continue collaboration with nephrology and Lisinopril for kidney protection, refills sent and recent noted reviewed.  LABS: CMP and CBC + urine ALB today.

## 2023-01-02 NOTE — Assessment & Plan Note (Signed)
Chronic, ongoing, refills sent in on Colace.

## 2023-01-02 NOTE — Assessment & Plan Note (Signed)
Chronic, ongoing.  Stable with Omeprazole, has tried reductions without success.  Continue current medication regimen and adjust as needed.  Mag level today.

## 2023-01-02 NOTE — Assessment & Plan Note (Addendum)
Recommend continued diet focus, cutting back on soda and drinking more water + weight loss.  A1c trend up to 6.1% today.  No current medications.  Urine ALB 80 February 2024.

## 2023-01-02 NOTE — Assessment & Plan Note (Signed)
Ongoing per nephrology notes.  Continue collaboration with nephrology, recent note reviewed.  CMP on labs today.

## 2023-01-02 NOTE — Assessment & Plan Note (Signed)
Chronic, stable. BP at goal in office today.  Continue current medication regimen and collaboration with nephrology.  Lisinopril for kidney protection, refills up to date.  Recommend checking BP at home on occasion and documenting.  Labs today: CBC, CMP, TSH, urine ALB.   Return in 6 months.

## 2023-01-03 LAB — CBC WITH DIFFERENTIAL/PLATELET
Basophils Absolute: 0 10*3/uL (ref 0.0–0.2)
Basos: 1 %
EOS (ABSOLUTE): 0.3 10*3/uL (ref 0.0–0.4)
Eos: 3 %
Hematocrit: 37.9 % (ref 34.0–46.6)
Hemoglobin: 12.1 g/dL (ref 11.1–15.9)
Immature Grans (Abs): 0 10*3/uL (ref 0.0–0.1)
Immature Granulocytes: 0 %
Lymphocytes Absolute: 2.2 10*3/uL (ref 0.7–3.1)
Lymphs: 27 %
MCH: 28.5 pg (ref 26.6–33.0)
MCHC: 31.9 g/dL (ref 31.5–35.7)
MCV: 89 fL (ref 79–97)
Monocytes Absolute: 0.4 10*3/uL (ref 0.1–0.9)
Monocytes: 5 %
Neutrophils Absolute: 5.2 10*3/uL (ref 1.4–7.0)
Neutrophils: 64 %
Platelets: 269 10*3/uL (ref 150–450)
RBC: 4.25 x10E6/uL (ref 3.77–5.28)
RDW: 14 % (ref 11.7–15.4)
WBC: 8.1 10*3/uL (ref 3.4–10.8)

## 2023-01-03 LAB — LIPID PANEL W/O CHOL/HDL RATIO
Cholesterol, Total: 155 mg/dL (ref 100–199)
HDL: 50 mg/dL (ref >39)
LDL Chol Calc (NIH): 73 mg/dL (ref 0–99)
Triglycerides: 194 mg/dL — ABNORMAL HIGH (ref 0–149)
VLDL Cholesterol Cal: 32 mg/dL (ref 5–40)

## 2023-01-03 LAB — COMPREHENSIVE METABOLIC PANEL
ALT: 20 IU/L (ref 0–32)
AST: 21 IU/L (ref 0–40)
Albumin/Globulin Ratio: 1.3 (ref 1.2–2.2)
Albumin: 3.9 g/dL (ref 3.9–4.9)
Alkaline Phosphatase: 131 IU/L — ABNORMAL HIGH (ref 44–121)
BUN/Creatinine Ratio: 9 — ABNORMAL LOW (ref 12–28)
BUN: 13 mg/dL (ref 8–27)
Bilirubin Total: <0.2 mg/dL (ref 0.0–1.2)
CO2: 21 mmol/L (ref 20–29)
Calcium: 9.3 mg/dL (ref 8.7–10.3)
Chloride: 102 mmol/L (ref 96–106)
Creatinine, Ser: 1.43 mg/dL — ABNORMAL HIGH (ref 0.57–1.00)
Globulin, Total: 3 g/dL (ref 1.5–4.5)
Glucose: 133 mg/dL — ABNORMAL HIGH (ref 70–99)
Potassium: 4.3 mmol/L (ref 3.5–5.2)
Sodium: 140 mmol/L (ref 134–144)
Total Protein: 6.9 g/dL (ref 6.0–8.5)
eGFR: 42 mL/min/{1.73_m2} — ABNORMAL LOW (ref >59)

## 2023-01-03 LAB — VITAMIN D 25 HYDROXY (VIT D DEFICIENCY, FRACTURES): Vit D, 25-Hydroxy: 38.3 ng/mL (ref 30.0–100.0)

## 2023-01-03 LAB — MAGNESIUM: Magnesium: 2 mg/dL (ref 1.6–2.3)

## 2023-01-03 LAB — TSH: TSH: 1.65 u[IU]/mL (ref 0.450–4.500)

## 2023-01-03 NOTE — Progress Notes (Signed)
Contacted via [place] morning Rita [name], your labs have returned: - Kidney function, creatinine and eGFR, continues to show stage 3b kidney disease which we will continue to monitor. There was some improvement this check compared to last, so ensure good water intake during daytime.  Liver function, AST and ALT, is normal. - Cholesterol levels stable with exception of triglycerides which are a little elevated.  Continue Rosuvastatin and making healthy diet changes. - Remainder of labs are all normal.  Great job!!   Keep being awesome!!  Thank you for allowing me to participate in your care.  I appreciate you. Kindest regards, Pollyanna Levay

## 2023-01-16 ENCOUNTER — Other Ambulatory Visit: Payer: Self-pay | Admitting: Nurse Practitioner

## 2023-01-16 NOTE — Telephone Encounter (Signed)
Requested Prescriptions  Pending Prescriptions Disp Refills   Multiple Vitamin (TAB-A-VITE) TABS [Pharmacy Med Name: Tab-A-Vite 400 mcg tablet] 30 tablet 5    Sig: TAKE ONE TABLET BY MOUTH EVERY DAY     There is no refill protocol information for this order    Refused Prescriptions Disp Refills   omeprazole (PRILOSEC) 20 MG capsule [Pharmacy Med Name: omeprazole 20 mg capsule,delayed release] 30 capsule 5    Sig: TAKE ONE CAPSULE BY MOUTH EVERY DAY     Gastroenterology: Proton Pump Inhibitors Passed - 01/16/2023  8:01 AM      Passed - Valid encounter within last 12 months    Recent Outpatient Visits           2 weeks ago Stage 3b chronic kidney disease ([place])   [place], Jolene T, NP   7 months ago Stage 3b chronic kidney disease ([place])   [place], Henrine Screws T, NP   1 year ago Schizoaffective disorder, bipolar type ([place])   [place], Barbaraann Faster, NP   1 year ago [place], NP   1 year ago Schizoaffective disorder, bipolar type [facility])   [place], Barbaraann Faster, NP       Future Appointments             In 5 months [place], Barbaraann Faster, NP [place], [facility]

## 2023-01-16 NOTE — Telephone Encounter (Signed)
Requested medications are due for refill today.  yes  Requested medications are on the active medications list.  yes  Last refill. 08/06/2022 #90 1 rf  Future visit scheduled.   yes  Notes to clinic.  No protocol.    Requested Prescriptions  Pending Prescriptions Disp Refills   Multiple Vitamin (TAB-A-VITE) TABS [Pharmacy Med Name: Tab-A-Vite 400 mcg tablet] 30 tablet 5    Sig: TAKE ONE TABLET BY MOUTH EVERY DAY     There is no refill protocol information for this order    Refused Prescriptions Disp Refills   omeprazole (PRILOSEC) 20 MG capsule [Pharmacy Med Name: omeprazole 20 mg capsule,delayed release] 30 capsule 5    Sig: TAKE ONE CAPSULE BY MOUTH EVERY DAY     Gastroenterology: Proton Pump Inhibitors Passed - 01/16/2023  8:01 AM      Passed - Valid encounter within last 12 months    Recent Outpatient Visits           2 weeks ago Stage 3b chronic kidney disease ([place])   [place], Jolene T, NP   7 months ago Stage 3b chronic kidney disease ([place])   [place], Henrine Screws T, NP   1 year ago Schizoaffective disorder, bipolar type ([place])   [place], Barbaraann Faster, NP   1 year ago [place], NP   1 year ago Schizoaffective disorder, bipolar type [facility])   [place], Barbaraann Faster, NP       Future Appointments             In 5 months [place], Barbaraann Faster, NP [place], [facility]

## 2023-02-17 ENCOUNTER — Other Ambulatory Visit: Payer: Self-pay | Admitting: Psychiatry

## 2023-02-17 MED ORDER — DULOXETINE HCL 60 MG PO CPEP: ORAL_CAPSULE | ORAL | 6 refills | Status: DC

## 2023-02-17 MED ORDER — CLONAZEPAM 0.5 MG PO TABS: 0.5000 mg | ORAL_TABLET | Freq: Two times a day (BID) | ORAL | 5 refills | Status: DC

## 2023-02-17 MED ORDER — DULOXETINE HCL 30 MG PO CPEP: 30.0000 mg | ORAL_CAPSULE | Freq: Every day | ORAL | 5 refills | Status: DC

## 2023-02-17 MED ORDER — TRAZODONE HCL 150 MG PO TABS: ORAL_TABLET | ORAL | 5 refills | Status: DC

## 2023-02-17 MED ORDER — QUETIAPINE FUMARATE 100 MG PO TABS: 100.0000 mg | ORAL_TABLET | Freq: Every day | ORAL | 5 refills | Status: DC

## 2023-04-07 ENCOUNTER — Encounter (HOSPITAL_COMMUNITY): Payer: Self-pay

## 2023-06-29 NOTE — Patient Instructions (Signed)
Be Involved in Caring For Your Health:  Taking Medications When medications are taken as directed, they can greatly improve your health. But if they are not taken as prescribed, they may not work. In some cases, not taking them correctly can be harmful. To help ensure your treatment remains effective and safe, understand your medications and how to take them. Bring your medications to each visit for review by your provider.  Your lab results, notes, and after visit summary will be available on My Chart. We strongly encourage you to use this feature. If lab results are abnormal the clinic will contact you with the appropriate steps. If the clinic does not contact you assume the results are satisfactory. You can always view your results on My Chart. If you have questions regarding your health or results, please contact the clinic during office hours. You can also ask questions on My Chart.  We at [facility] are grateful that you chose [place] to provide your care. We strive to provide evidence-based and compassionate care and are always looking for feedback. If you get a survey from the clinic please complete this so we can hear your opinions.  Food Basics for Chronic Kidney Disease Chronic kidney disease (CKD) is when your kidneys are not working well. They cannot remove waste, fluids, and other substances from your blood the way they should. These substances can build up, which can worsen kidney damage and affect how your body works. Eating certain foods can lead to a buildup of these substances. Changing your diet can help prevent more kidney damage. Diet changes may also delay dialysis or even keep you from needing it. What nutrients should I limit? Work with your treatment team and a food expert (dietitian) to make a meal plan that's right for you. Foods you can eat and foods you should limit or avoid will depend on the stage of your kidney disease and any other health conditions you have.  The items listed below are not a complete list. Talk with your dietitian to learn what is best for you. Potassium Potassium affects how steadily your heart beats. Too much potassium in your blood can cause an irregular heartbeat or even a heart attack. You may need to limit foods that are high in potassium, such as: Liquid milk and soy milk. Salt substitutes that contain potassium. Fruits like bananas, apricots, nectarines, melon, prunes, raisins, kiwi, and oranges. Vegetables, such as potatoes, sweet potatoes, yams, tomatoes, leafy greens, beets, avocado, pumpkin, and winter squash. Beans, like lima beans. Nuts. Phosphorus Phosphorus is a mineral found in your bones. You need a balance between calcium and phosphorus to build and maintain healthy bones. Too much added phosphorus from the foods you eat can pull calcium from your bones. Losing calcium can make your bones weak and more likely to break. Too much phosphorus can also make your skin itch. You may need to limit foods that are high in phosphorus or that have added phosphorus, such as: Liquid milk and dairy products. Dark-colored sodas or soft drinks. Bran cereals and oatmeal. Protein  Protein helps you make and keep muscle. Protein also helps to repair your body's cells and tissues. One of the natural breakdown products of protein is a waste product called urea. When your kidneys are not working well, they cannot remove types of waste like urea. Reducing protein in your diet can help keep urea from building up in your blood. Depending on your stage of kidney disease, you may need to  eat smaller portions of foods that are high in protein. Sources of animal protein include: Meat (all types). Fish and seafood. Poultry. Eggs. Dairy. Other protein foods include: Beans and legumes. Nuts and nut butter. Soy, like tofu.  Sodium Salt (sodium) helps to keep a healthy balance of fluids in your body. Too much salt can increase your blood  pressure, which can harm your heart and lungs. Extra salt can also cause your body to keep too much fluid, making your kidneys work harder. You may need to limit or avoid foods that are high in salt, such as: Salt seasonings. Soy and teriyaki sauce. Packaged, precooked, cured, or processed meats, such as sausages or meat loaves. Sardines. Salted crackers and snack foods. Fast food. Canned soups and most canned foods. Pickled foods. Vegetable juice. Boxed mixes or ready-to-eat boxed meals and side dishes. Bottled dressings, sauces, and marinades. Talk with your dietitian about how much potassium, phosphorus, protein, and salt you may have each day. Helpful tips Read food labels  Check the amount of salt in foods. Limit foods that have salt or sodium listed among the first five ingredients. Try to eat low-salt foods. Check the ingredient list for added phosphorus or potassium. "Phos" in an ingredient is a sign that phosphorus has been added. Do not buy foods that are calcium-enriched or that have calcium added to them (are fortified). Buy canned vegetables and beans that say "no salt added" and rinse them before eating. Lifestyle Limit the amount of protein you eat from animal sources each day. Focus on protein from plant sources, like tofu and dried beans, peas, and lentils. Do not add salt to food when cooking or before eating. Do not eat star fruit. It can be toxic for people with kidney problems. Talk with your health care provider before taking any vitamin or mineral supplements. If told by your health care provider, track how much liquid you drink so you can avoid drinking too much. You may need to include foods you eat that are made mostly from water, like gelatin, ice cream, soups, and juicy fruits and vegetables. If you have diabetes: If you have diabetes (diabetes mellitus) and CKD, you need to keep your blood sugar (glucose) in the target range recommended by your health care  provider. Follow your diabetes management plan. This may include: Checking your blood glucose regularly. Taking medicines by mouth, or taking insulin, or both. Exercising for at least 30 minutes on 5 or more days each week, or as told by your health care provider. Tracking how many servings of carbohydrates you eat at each meal. Not using orange juice to treat low blood sugars. Instead, use apple juice, cranberry juice, or clear soda. You may be given guidelines on what foods and nutrients you may eat, and how much you can have each day. This depends on your stage of kidney disease and whether you have high blood pressure (hypertension). Follow the meal plan your dietitian gives you. To learn more: [employer] of Diabetes and Digestive and Kidney Diseases: [employer]: kidney.org Summary Chronic kidney disease (CKD) is when your kidneys are not working well. They cannot remove waste, fluids, and other substances from your blood the way they should. These substances can build up, which can worsen kidney damage and affect how your body works. Changing your diet can help prevent more kidney damage. Diet changes may also delay dialysis or even keep you from needing it. Diet changes are different for each person with CKD.  Work with a dietitian to set up a meal plan that is right for you. This information is not intended to replace advice given to you by your health care provider. Make sure you discuss any questions you have with your health care provider. Document Revised: 03/07/2022 Document Reviewed: 03/13/2020 Elsevier Patient Education  2024 [employer].

## 2023-06-30 ENCOUNTER — Ambulatory Visit (INDEPENDENT_AMBULATORY_CARE_PROVIDER_SITE_OTHER): Payer: 59 | Admitting: Psychiatry

## 2023-06-30 VITALS — BP 114/76 | HR 76 | Temp 96.7°F | Ht 59.0 in | Wt 229.8 lb

## 2023-06-30 DIAGNOSIS — Z9189 Other specified personal risk factors, not elsewhere classified: Secondary | ICD-10-CM

## 2023-06-30 DIAGNOSIS — F79 Unspecified intellectual disabilities: Secondary | ICD-10-CM | POA: Diagnosis not present

## 2023-06-30 DIAGNOSIS — F70 Mild intellectual disabilities: Secondary | ICD-10-CM | POA: Insufficient documentation

## 2023-06-30 DIAGNOSIS — G4700 Insomnia, unspecified: Secondary | ICD-10-CM | POA: Diagnosis not present

## 2023-06-30 DIAGNOSIS — Z79899 Other long term (current) drug therapy: Secondary | ICD-10-CM

## 2023-06-30 DIAGNOSIS — F250 Schizoaffective disorder, bipolar type: Secondary | ICD-10-CM | POA: Diagnosis not present

## 2023-06-30 NOTE — Patient Instructions (Signed)
Please call for [id number] 

## 2023-06-30 NOTE — Progress Notes (Unsigned)
BH MD OP Progress Note  06/30/2023 2:04 PM Rita [name]  MRN:  [id number]  Chief Complaint:  Chief Complaint  Patient presents with   Follow-up   schizoaffective disorder   Medication Refill   HPI: Rita [name] is a 62 year old Caucasian female, single, on SSD, has a history of schizoaffective disorder bipolar type, intellectual disability mild to moderate, chronic kidney disease stage IIIb, hypertension, secondary hyperparathyroidism, migraine headaches, lives in [place] in an adult family living home was evaluated in office today.  Patient presented with caretaker [name]. Patient used to be under the care of Dr. [name] Amend who left the practice and presented to establish care with this provider.  Patient today appeared to be alert, oriented to person place time and situation.  Patient was able to answer questions appropriately with some support from caretaker.  Collateral information was obtained from caretaker as well as reviewed medical records from previous providers-Dr. Clapacs 08/08/2015 - 08/08/2022.  As per Dr. [name] Amend, at her last appointment in September 2023, patient was doing well on the current medication regimen.  Patient continues to report mood wise she is doing well.  She is currently compliant on medications.  Her caretaker provides medications to her on a daily basis.  She denies side effects.  Patient does not believe she is depressed or anxious.  She does worry about her sleep though.  Reports she is keeping track of her sleep on her phone and some days she sleeps more and some nights she has difficulty falling asleep.  According to caretaker this likely could be because of not having a sleep hygiene as well as spending too much time on her phone.  Patient currently denies any suicidality, homicidality or perceptual disturbances.  Patient is currently attending the [employer] program 4 days a week.  She enjoys it.  She also participates in several hobbies including doing puzzles, watching  YouTube videos.  Denies any other concerns today.  Visit Diagnosis:    ICD-10-CM   1. Schizoaffective disorder, bipolar type (HCC)  F25.0 Prolactin    EKG 12-Lead    Urine drugs of abuse scrn w alc, routine (Ref Lab)    2. Insomnia, unspecified type  G47.00     3. Intellectual disability  F79    mild to moderate    4. High risk medication use  Z79.899 Prolactin    Urine drugs of abuse scrn w alc, routine (Ref Lab)    5. At risk for prolonged QT interval syndrome  Z91.89 EKG 12-Lead      Past Psychiatric History: Patient used to be under the care of Dr. [name] Amend at [facility] psychiatric [facility] previously.  Patient had multiple inpatient behavioral health admissions-02/28/2012, 09/29/2012 at [facility]. As per review of records patient does have a history of psychosis-auditory hallucinations. As per review of medical records patient does have a history of suicidality-although I could not find any record of actual suicide attempts. Multiple also medications in the past including Abilify, Seroquel, trazodone, Cymbalta, Wellbutrin, Topamax.  Past Medical History:  Past Medical History:  Diagnosis Date   Adenomatous polyps    Anxiety    Chronic constipation    Chronic tension headaches    CKD (chronic kidney disease) stage 3, GFR 30-59 ml/min (HCC)    Depression    Factitious disorder    GERD (gastroesophageal reflux disease)    Hyperlipidemia    Hypertension    Intellectual disability due to developmental disorder, unspecified    Mental  retardation    Microscopic hematuria    Migraine    Obesity     Past Surgical History:  Procedure Laterality Date   CARPAL TUNNEL RELEASE     COLONOSCOPY WITH PROPOFOL N/A 06/27/2017   Procedure: COLONOSCOPY WITH PROPOFOL;  Surgeon: Scot Jun, MD;  Location: [facility] ENDOSCOPY;  Service: Endoscopy;  Laterality: N/A;   COLONOSCOPY WITH PROPOFOL N/A 07/23/2022   Procedure: COLONOSCOPY WITH PROPOFOL;   Surgeon: Wyline Mood, MD;  Location: [facility] ENDOSCOPY;  Service: Gastroenterology;  Laterality: N/A;   CYSTOSCOPY W/ RETROGRADES Bilateral 11/15/2015   Procedure: CYSTOSCOPY WITH RETROGRADE PYELOGRAM;  Surgeon: Vanna Scotland, MD;  Location: [facility] ORS;  Service: Urology;  Laterality: Bilateral;   US ECHOCARDIOGRAPHY  2012   showed mild heart failure (LVEF 52%); small pericardial effusion    Family Psychiatric History: Patient could not give any details about family mental health history, will need to explore this in future sessions.  Family History:  Family History  Problem Relation Age of Onset   Heart disease Father    Hyperlipidemia Father    Kidney disease Father    Breast cancer Maternal Aunt    Prostate cancer Neg Hx     Social History: Patient was born and raised in [place], [place].  She was raised by both parents.  Had a good childhood.  Reports she has 2 older sisters and 1 brother.  She went up to 12th grade, special classes.  She moved to [place] since her dad had to move here due to job change when she was around 62 years old.  Patient is single.  She currently lives in an adult family living Home, has good support system.  Patient reports her sister is her legal guardian, caregiver advised to have this documentation available for [place] to scanned into the system if not already done.  Patient goes to [facility] program 4 days a week.  She also enjoys watching [employer], working on [employer].  Patient currently lives in [place]. Social History   Socioeconomic History   Marital status: Single    Spouse name: Not on file   Number of children: Not on file   Years of education: Not on file   Highest education level: 12th grade  Occupational History   Occupation: student  Tobacco Use   Smoking status: Never   Smokeless tobacco: Never  Vaping Use   Vaping status: Never Used  Substance and Sexual Activity   Alcohol use: No   Drug use: No   Sexual activity: Never   Other Topics Concern   Not on file  Social History Narrative   Goes to [employer]   Social Determinants of Health   Financial Resource Strain: Low Risk  (06/30/2023)   Overall Financial Resource Strain (CARDIA)    Difficulty of Paying Living Expenses: Not hard at all  Food Insecurity: No Food Insecurity (06/30/2023)   Hunger Vital Sign    Worried About Running Out of Food in the Last Year: Never true    Ran Out of Food in the Last Year: Never true  Transportation Needs: No Transportation Needs (06/30/2023)   PRAPARE - Administrator, Civil Service (Medical): No    Lack of Transportation (Non-Medical): No  Physical Activity: Insufficiently Active (06/30/2023)   Exercise Vital Sign    Days of Exercise per Week: 1 day    Minutes of Exercise per Session: 20 min  Stress: No Stress Concern Present (06/30/2023)   [employer] of  Occupational Health - Occupational Stress Questionnaire    Feeling of Stress : Only a little  Social Connections: Moderately Integrated (06/30/2023)   Social Connection and Isolation Panel [NHANES]    Frequency of Communication with Friends and Family: More than three times a week    Frequency of Social Gatherings with Friends and Family: More than three times a week    Attends Religious Services: More than 4 times per year    Active Member of [employer] or Organizations: No    Attends Engineer, structural: More than 4 times per year    Marital Status: Never married    Allergies:  Allergies  Allergen Reactions   Advil [Ibuprofen] Other (See Comments)    Reaction:  Gives her kidney problems     Metabolic Disorder Labs: Lab Results  Component Value Date   HGBA1C 6.1 (H) 01/02/2023   No results found for: "PROLACTIN" Lab Results  Component Value Date   CHOL 155 01/02/2023   TRIG 194 (H) 01/02/2023   HDL 50 01/02/2023   CHOLHDL 4.5 (H) 07/14/2018   VLDL 62 (H) 04/25/2020   LDLCALC 73 01/02/2023   LDLCALC 71 05/28/2022   Lab Results   Component Value Date   TSH 1.650 01/02/2023   TSH 1.520 01/01/2022    Therapeutic Level Labs: No results found for: "LITHIUM" No results found for: "VALPROATE" No results found for: "CBMZ"  Current Medications: Current Outpatient Medications  Medication Sig Dispense Refill   Acetaminophen Extra Strength 500 MG TABS TAKE (2) TABLETS BY MOUTH EVERY SIX HOURS AS NEEDED. 240 tablet 4   aspirin 81 MG chewable tablet Chew 1 tablet (81 mg total) by mouth daily. 30 tablet 12   carvedilol (COREG) 6.25 MG tablet Take 6.25 mg by mouth 2 (two) times daily.     cholecalciferol (VITAMIN D3) 25 MCG (1000 UNIT) tablet TAKE (2) TABLETS BY MOUTH ONCE DAILY. 180 tablet 4   clonazePAM (KLONOPIN) 0.5 MG tablet Take 1 tablet (0.5 mg total) by mouth 2 (two) times daily. 60 tablet 5   diclofenac Sodium (VOLTAREN) 1 % GEL Apply 2 g topically 3 (three) times daily. 100 g 4   docusate sodium (COLACE) 100 MG capsule TAKE (1) CAPSULE BY MOUTH TWICE DAILY AS NEEDED. 180 capsule 4   DULoxetine (CYMBALTA) 30 MG capsule Take 1 capsule (30 mg total) by mouth at bedtime. 30 capsule 5   DULoxetine (CYMBALTA) 60 MG capsule TAKE (1) CAPSULE BY MOUTH ONCE DAILY. 30 capsule 6   GNP COUGH DM ER 30 MG/5ML liquid GIVE 2.5MLS BY MOUTH AS NEEDED FOR COUGH. 30 mL 0   lisinopril (ZESTRIL) 5 MG tablet Take 1 tablet (5 mg total) by mouth daily. 90 tablet 4   magnesium oxide (MAG-OX) 400 (240 Mg) MG tablet Take 1 tablet (400 mg total) by mouth daily. 30 tablet 12   Multiple Vitamin (TAB-A-VITE) TABS TAKE ONE TABLET BY MOUTH EVERY DAY 30 tablet 12   omeprazole (PRILOSEC) 20 MG capsule TAKE (1) CAPSULE BY MOUTH ONCE DAILY. 90 capsule 4   QUEtiapine (SEROQUEL) 100 MG tablet Take 1 tablet (100 mg total) by mouth at bedtime. 30 tablet 5   rosuvastatin (CRESTOR) 40 MG tablet Take 1 tablet (40 mg total) by mouth daily. for cholesterol 90 tablet 4   SUMAtriptan (IMITREX) 100 MG tablet Take by mouth.     traZODone (DESYREL) 150 MG tablet TAKE  (1) TABLET BY MOUTH DAILY AT BEDTIME. 30 tablet 5   No current facility-administered medications  for this visit.     Musculoskeletal: Strength & Muscle Tone: within normal limits Gait & Station: normal Patient leans: N/A  Psychiatric Specialty Exam: Review of Systems  Psychiatric/Behavioral: Negative.      Blood pressure 114/76, pulse 76, temperature (!) 96.7 F (35.9 C), temperature source Skin, height 4\' 11"  (1.499 m), weight 229 lb 12.8 oz (104.2 kg).Body mass index is 46.41 kg/m.  General Appearance: Fairly Groomed  Eye Contact:  Minimal  Speech:  Clear and Coherent  Volume:  Normal  Mood:  Euthymic  Affect:  Full Range  Thought Process:  Goal Directed and Descriptions of Associations: Intact  Orientation:  Full (Time, Place, and Person)  Thought Content: Logical   Suicidal Thoughts:  No  Homicidal Thoughts:  No  Memory:  Immediate;   Fair Recent;   Fair Remote;   Fair  Judgement:  Fair  Insight:  Shallow  Psychomotor Activity:  Normal  Concentration:  Concentration: Fair and Attention Span: Fair  Recall:  [employer] of Knowledge: Fair  Language: Fair  Akathisia:  No  Handed:  Right  AIMS (if indicated): done  Assets:  Communication Skills Desire for Improvement Housing Social Support  ADL's:  Intact  Cognition: WNL  Sleep:  Fair   Screenings: GAD-7    Garment/textile technologist Visit from 06/30/2023 in [facility] Office Visit from 01/02/2023 in [facility] Office Visit from 05/28/2022 in [facility] Office Visit from 01/01/2022 in [facility] Office Visit from 07/14/2018 in [facility]  Total GAD-7 Score 0 0 1 5 3       PHQ2-9    Flowsheet Row Office Visit from 06/30/2023 in [facility] Office Visit from 01/02/2023 in [facility] Clinical Support from 11/12/2022  in [facility] Office Visit from 05/28/2022 in [facility] Office Visit from 01/01/2022 in [facility]  PHQ-2 Total Score 0 0 0 0 0  PHQ-9 Total Score 0 0 0 2 2      Flowsheet Row Office Visit from 06/30/2023 in [facility] Admission (Discharged) from 07/23/2022 in [facility]  C-SSRS RISK CATEGORY No Risk No Risk        Assessment and Plan: Rita [name] is a 63 year old Caucasian female, single, on SSD, has a history of schizoaffective disorder bipolar type, intellectual disability mild to moderate, chronic kidney disease stage IIIb, hypertension, secondary hyperparathyroidism, migraine headaches, lives in [place] in an adult family living home was evaluated in office today.  Patient presented with caretaker [name].  Patient today based on evaluation appears to be stable with regards to her mood symptoms on the current medication regimen although does report sleep issues likely due to lack of sleep hygiene.  Plan as noted below.  Plan Schizoaffective disorder bipolar type-stable Continue Seroquel 100 mg p.o. nightly. Duloxetine 90 mg p.o. daily at bedtime Klonopin 0.5 mg p.o. twice daily.  Insomnia-unstable  Likely due to sleep hygiene.  She is logging her sleep on her for phone. Patient provided education about sleep hygiene techniques. Continue trazodone 150 mg at bedtime.  Intellectual disability likely mild to moderate-chronic Patient currently has a caretaker.  Patient lives in an adult family living. She also goes to [employer].  High risk medication use-patient on long-term use of benzodiazepine therapy, patient with comorbid mood  symptoms sleep problems will benefit from getting a urine drug screen since I do not see any in the system.  Patient to go to [facility] lab. Will also order prolactin level-patient provided lab slip. Reviewed and  discussed TSH-01/02/2023-within normal limits. Lipid panel triglycerides elevated at 194-otherwise within normal limits-01/02/2023. Hemoglobin A1c-6.1-01/02/2023. CMP-glucose elevated at 133, creatinine 1.43, GFR-42-01/02/2023-patient to follow up with primary care provider. CBC with differential-within normal limits.  At risk for prolonged QT syndrome-we will order EKG since patient is on medications like Seroquel.  Patient to go to [facility]-call [phone].  Collateral information obtained from caretaker-[name] who presented in session as well as review of medical records from Dr. [name] Amend as noted above.  Follow-up in clinic in 3 months or sooner if needed.  Collaboration of Care: Collaboration of Care: Other patient to establish care with a therapist, provided resources.  Patient/Guardian was advised Release of Information must be obtained prior to any record release in order to collaborate their care with an outside provider. Patient/Guardian was advised if they have not already done so to contact the registration department to sign all necessary forms in order for [place] to release information regarding their care.   Consent: Patient/Guardian gives verbal consent for treatment and assignment of benefits for services provided during this visit. Patient/Guardian expressed understanding and agreed to proceed.   I have spent atleast 60 minutes face to face with patient today which includes the time spent for preparing to see the patient ( e.g., review of test, records ), obtaining and to review and separately obtained history , ordering medications and test ,psychoeducation and supportive psychotherapy and care coordination,as well as documenting clinical information in electronic health record,interpreting and communication of test results.  This note was generated in part or whole with voice recognition software. Voice recognition is usually quite accurate but there are transcription errors that can and very  often do occur. I apologize for any typographical errors that were not detected and corrected.     Jomarie Longs, MD 06/30/2023, 2:04 PM

## 2023-07-02 ENCOUNTER — Ambulatory Visit (INDEPENDENT_AMBULATORY_CARE_PROVIDER_SITE_OTHER): Payer: 59 | Admitting: Nurse Practitioner

## 2023-07-02 ENCOUNTER — Encounter: Payer: Self-pay | Admitting: Nurse Practitioner

## 2023-07-02 VITALS — BP 117/72 | HR 72 | Temp 98.2°F | Ht 59.0 in | Wt 228.2 lb

## 2023-07-02 DIAGNOSIS — N1832 Chronic kidney disease, stage 3b: Secondary | ICD-10-CM

## 2023-07-02 DIAGNOSIS — R7301 Impaired fasting glucose: Secondary | ICD-10-CM

## 2023-07-02 DIAGNOSIS — I10 Essential (primary) hypertension: Secondary | ICD-10-CM

## 2023-07-02 DIAGNOSIS — E210 Primary hyperparathyroidism: Secondary | ICD-10-CM | POA: Diagnosis not present

## 2023-07-02 DIAGNOSIS — F250 Schizoaffective disorder, bipolar type: Secondary | ICD-10-CM

## 2023-07-02 DIAGNOSIS — E782 Mixed hyperlipidemia: Secondary | ICD-10-CM

## 2023-07-02 DIAGNOSIS — Z79899 Other long term (current) drug therapy: Secondary | ICD-10-CM

## 2023-07-02 LAB — BAYER DCA HB A1C WAIVED: HB A1C (BAYER DCA - WAIVED): 6 % — ABNORMAL HIGH (ref 4.8–5.6)

## 2023-07-02 NOTE — Assessment & Plan Note (Signed)
Chronic, ongoing.  Continue current medication regimen and adjust as needed. Lipid panel today. 

## 2023-07-02 NOTE — Assessment & Plan Note (Signed)
BMI 46.09 with HTN, CKD.  Recommended eating smaller high protein, low fat meals more frequently and exercising 30 mins a day 5 times a week with a goal of 10-15lb weight loss in the next 3 months. Patient voiced their understanding and motivation to adhere to these recommendations.

## 2023-07-02 NOTE — Assessment & Plan Note (Signed)
UDS, Prolactin, and EKG obtained for psychiatry today.

## 2023-07-02 NOTE — Assessment & Plan Note (Addendum)
Recommend continued diet focus, cutting back on soda and drinking more water + weight loss.  A1c trend up to 6.1% last visit, recheck today.  No current medications, start as needed.  Urine ALB 80 February 2024.

## 2023-07-02 NOTE — Assessment & Plan Note (Signed)
Chronic, stable, followed by psychiatry.  Continue current medication regimen as prescribed by them, recent note reviewed.  Denies SI/HI.  Will obtain EKG and Prolactin level + UDS for psychiatry today -- will send results to them.

## 2023-07-02 NOTE — Progress Notes (Signed)
BP 117/72   Pulse 72   Temp 98.2 F (36.8 C) (Oral)   Ht 4\' 11"  (1.499 m)   Wt 228 lb 3.2 oz (103.5 kg)   LMP  (LMP Unknown)   SpO2 96%   BMI 46.09 kg/m    Subjective:    Patient ID: Rita [name], female    DOB: 1961-03-04, 62 y.o.   MRN: [id number]  HPI: SALY HOSKIN is a 62 y.o. female  Chief Complaint  Patient presents with   Chronic Kidney Disease   Hypertension   Hyperlipidemia   Depression   HYPERTENSION / HYPERLIPIDEMIA Continues on Crestor, Lisinopril and Carvedilol + ASA. Satisfied with current treatment? no Duration of hypertension: chronic BP monitoring frequency: not checking BP range:  BP medication side effects: no Duration of hyperlipidemia: chronic Cholesterol medication side effects: no Cholesterol supplements: none Medication compliance: excellent compliance Aspirin: yes Recent stressors: no Recurrent headaches: no Visual changes: no Palpitations: no Dyspnea: no Chest pain: no Lower extremity edema: no Dizzy/lightheaded: no   CHRONIC KIDNEY DISEASE Last saw nephrology 05/29/23 with labs noting CRT 1.58, eGFR 37, and PTH 73.   CKD status: stable Medications renally dose: no Previous renal evaluation: yes Pneumovax:  Up to Date Influenza Vaccine:  Up to Date   IFG A1c 6.1% at last visit, no medications. Continues to drink sodas when on campus (she is transitioning to sugar free) -- has been instructed to drink more water by PCP and nephrology -- continues to struggle with this. Polydipsia/polyuria: no Visual disturbance: no Chest pain: no Paresthesias: no  SCHIZOAFFECTIVE DISORDER Followed by psychiatry and last seen by psychiatry on 06/30/23 -- no changes made.  She does have learning disabilities and lives in stable group home.  Does attend therapy once a month with Inetta Fermo. Takes Klonopin, Seroquel, + Cymbalta.  Needs EKG in office today for psychiatry and Prolactin + UDS required by psychiatry. Mood status: stable Satisfied with  current treatment?: yes Symptom severity: mild  Duration of current treatment : chronic Side effects: no Medication compliance: excellent compliance Psychotherapy/counseling: yes current Depressed mood: no Anxious mood: no Anhedonia: no Significant weight loss or gain: no Insomnia: no  Fatigue: no Feelings of worthlessness or guilt: no Impaired concentration/indecisiveness: yes Suicidal ideations: no Hopelessness: no Crying spells: no    07/02/2023    8:45 AM 06/30/2023    9:52 AM 01/02/2023    9:58 AM 11/12/2022    8:42 AM 05/28/2022   11:05 AM  Depression screen PHQ 2/9  Decreased Interest 0  0 0 0  Down, Depressed, Hopeless 0  0 0 0  PHQ - 2 Score 0  0 0 0  Altered sleeping 0  0 0 0  Tired, decreased energy 0  0 0 1  Change in appetite 0  0 0 0  Feeling bad or failure about yourself  0  0 0 0  Trouble concentrating 0  0 0 1  Moving slowly or fidgety/restless 0  0 0 0  Suicidal thoughts 0  0 0 0  PHQ-9 Score 0  0 0 2  Difficult doing work/chores Not difficult at all  Not difficult at all Not difficult at all Somewhat difficult     Information is confidential and restricted. Go to Review Flowsheets to unlock data.       07/02/2023    8:45 AM 06/30/2023    9:52 AM 01/02/2023    9:58 AM 05/28/2022   11:05 AM  GAD 7 : Generalized Anxiety  Score  Nervous, Anxious, on Edge 0  0 0  Control/stop worrying 0  0 1  Worry too much - different things 0  0 0  Trouble relaxing 0  0 0  Restless 0  0 0  Easily annoyed or irritable 0  0 0  Afraid - awful might happen 0  0 0  Total GAD 7 Score 0  0 1  Anxiety Difficulty Not difficult at all  Not difficult at all Not difficult at all     Information is confidential and restricted. Go to Review Flowsheets to unlock data.    Relevant past medical, surgical, family and social history reviewed and updated as indicated. Interim medical history since our last visit reviewed. Allergies and medications reviewed and updated.  Review of  Systems  Constitutional:  Negative for activity change, appetite change, diaphoresis, fatigue and fever.  Respiratory:  Negative for cough, chest tightness and shortness of breath.   Cardiovascular:  Negative for chest pain, palpitations and leg swelling.  Gastrointestinal: Negative.   Endocrine: Negative for polydipsia, polyphagia and polyuria.  Neurological: Negative.  Negative for dizziness and headaches.  Psychiatric/Behavioral: Negative.      Per HPI unless specifically indicated above     Objective:    BP 117/72   Pulse 72   Temp 98.2 F (36.8 C) (Oral)   Ht 4\' 11"  (1.499 m)   Wt 228 lb 3.2 oz (103.5 kg)   LMP  (LMP Unknown)   SpO2 96%   BMI 46.09 kg/m   Wt Readings from Last 3 Encounters:  07/02/23 228 lb 3.2 oz (103.5 kg)  01/02/23 229 lb 11.2 oz (104.2 kg)  07/23/22 229 lb (103.9 kg)    Physical Exam Vitals and nursing note reviewed.  Constitutional:      General: She is awake. She is not in acute distress.    Appearance: Normal appearance. She is well-developed. She is obese. She is not ill-appearing or toxic-appearing.  HENT:     Head: Normocephalic and atraumatic.     Right Ear: Hearing normal.     Left Ear: Hearing normal.     Mouth/Throat:     Mouth: Mucous membranes are moist.  Eyes:     General: Lids are normal.        Right eye: No discharge.        Left eye: No discharge.     Extraocular Movements: Extraocular movements intact.     Conjunctiva/sclera: Conjunctivae normal.     Visual Fields: Right eye visual fields normal and left eye visual fields normal.  Neck:     Thyroid: No thyromegaly.     Vascular: No carotid bruit.     Trachea: Trachea normal.  Cardiovascular:     Rate and Rhythm: Normal rate and regular rhythm.     Pulses: Normal pulses.     Heart sounds: Normal heart sounds. No murmur heard.    No gallop.  Pulmonary:     Effort: Pulmonary effort is normal. No accessory muscle usage or respiratory distress.     Breath sounds: Normal  breath sounds.  Abdominal:     General: Bowel sounds are normal.     Palpations: Abdomen is soft.     Tenderness: There is no abdominal tenderness.  Musculoskeletal:     Cervical back: Normal range of motion and neck supple.     Right lower leg: No edema.     Left lower leg: No edema.  Lymphadenopathy:     Head:  Right side of head: No submental, submandibular, tonsillar, preauricular or posterior auricular adenopathy.     Left side of head: No submental, submandibular, tonsillar, preauricular or posterior auricular adenopathy.     Cervical: No cervical adenopathy.  Skin:    General: Skin is warm and dry.     Capillary Refill: Capillary refill takes less than 2 seconds.  Neurological:     Mental Status: She is alert and oriented to person, place, and time.     Gait: Gait is intact.     Deep Tendon Reflexes: Reflexes are normal and symmetric.     Reflex Scores:      Brachioradialis reflexes are 2+ on the right side and 2+ on the left side.      Patellar reflexes are 2+ on the right side and 2+ on the left side. Psychiatric:        Attention and Perception: Attention normal.        Mood and Affect: Mood normal.        Speech: Speech normal.        Behavior: Behavior normal. Behavior is cooperative.        Thought Content: Thought content normal.        Judgment: Judgment normal.   EKG My review and personal interpretation at Time: 1000  Indication: high risk psychiatry medication Rate: 72  Rhythm: sinus Axis: left Other: No nonspecific st abn, no stemi, no lvh -- RBBB and QT 416  Results for orders placed or performed in visit on 01/02/23  Bayer DCA Hb A1c Waived  Result Value Ref Range   HB A1C (BAYER DCA - WAIVED) 6.1 (H) 4.8 - 5.6 %  Microalbumin, Urine Waived  Result Value Ref Range   Microalb, Ur Waived 80 (H) 0 - 19 mg/L   Creatinine, Urine Waived 300 10 - 300 mg/dL   Microalb/Creat Ratio <30 <30 mg/g  CBC with Differential/Platelet  Result Value Ref Range   WBC  8.1 3.4 - 10.8 x10E3/uL   RBC 4.25 3.77 - 5.28 x10E6/uL   Hemoglobin 12.1 11.1 - 15.9 g/dL   Hematocrit 96.0 45.4 - 46.6 %   MCV 89 79 - 97 fL   MCH 28.5 26.6 - 33.0 pg   MCHC 31.9 31.5 - 35.7 g/dL   RDW 09.8 11.9 - 14.7 %   Platelets 269 150 - 450 x10E3/uL   Neutrophils 64 Not Estab. %   Lymphs 27 Not Estab. %   Monocytes 5 Not Estab. %   Eos 3 Not Estab. %   Basos 1 Not Estab. %   Neutrophils Absolute 5.2 1.4 - 7.0 x10E3/uL   Lymphocytes Absolute 2.2 0.7 - 3.1 x10E3/uL   Monocytes Absolute 0.4 0.1 - 0.9 x10E3/uL   EOS (ABSOLUTE) 0.3 0.0 - 0.4 x10E3/uL   Basophils Absolute 0.0 0.0 - 0.2 x10E3/uL   Immature Granulocytes 0 Not Estab. %   Immature Grans (Abs) 0.0 0.0 - 0.1 x10E3/uL  Comprehensive metabolic panel  Result Value Ref Range   Glucose 133 (H) 70 - 99 mg/dL   BUN 13 8 - 27 mg/dL   Creatinine, Ser 8.29 (H) 0.57 - 1.00 mg/dL   eGFR 42 (L) >56 OZ/HYQ/6.57   BUN/Creatinine Ratio 9 (L) 12 - 28   Sodium 140 134 - 144 mmol/L   Potassium 4.3 3.5 - 5.2 mmol/L   Chloride 102 96 - 106 mmol/L   CO2 21 20 - 29 mmol/L   Calcium 9.3 8.7 - 10.3 mg/dL   Total  Protein 6.9 6.0 - 8.5 g/dL   Albumin 3.9 3.9 - 4.9 g/dL   Globulin, Total 3.0 1.5 - 4.5 g/dL   Albumin/Globulin Ratio 1.3 1.2 - 2.2   Bilirubin Total <0.2 0.0 - 1.2 mg/dL   Alkaline Phosphatase 131 (H) 44 - 121 IU/L   AST 21 0 - 40 IU/L   ALT 20 0 - 32 IU/L  Lipid Panel w/o Chol/HDL Ratio  Result Value Ref Range   Cholesterol, Total 155 100 - 199 mg/dL   Triglycerides 782 (H) 0 - 149 mg/dL   HDL 50 >95 mg/dL   VLDL Cholesterol Cal 32 5 - 40 mg/dL   LDL Chol Calc (NIH) 73 0 - 99 mg/dL  TSH  Result Value Ref Range   TSH 1.650 0.450 - 4.500 uIU/mL  VITAMIN D 25 Hydroxy (Vit-D Deficiency, Fractures)  Result Value Ref Range   Vit D, 25-Hydroxy 38.3 30.0 - 100.0 ng/mL  Magnesium  Result Value Ref Range   Magnesium 2.0 1.6 - 2.3 mg/dL      Assessment & Plan:   Problem List Items Addressed This Visit        Cardiovascular and Mediastinum   Essential hypertension    Chronic, stable. BP at goal in office today.  Continue current medication regimen and collaboration with nephrology.  Lisinopril for kidney protection, refills up to date.  Recommend checking BP at home on occasion and documenting.  Labs today: A1c.   Return in 6 months.          Endocrine   IFG (impaired fasting glucose)    Recommend continued diet focus, cutting back on soda and drinking more water + weight loss.  A1c trend up to 6.1% last visit, recheck today.  No current medications, start as needed.  Urine ALB 80 February 2024.      Relevant Orders   Bayer DCA Hb A1c Waived   Primary hyperparathyroidism (HCC)    Ongoing per nephrology notes.  Continue collaboration with nephrology, recent note reviewed.  Labs up to date with nephrology.        Genitourinary   Chronic kidney disease, stage 3 (HCC)    Chronic, stable.  Continue collaboration with nephrology and Lisinopril for kidney protection, recent notes reviewed.  Labs up to date with nephrology.        Other   High risk medication use    UDS, Prolactin, and EKG obtained for psychiatry today.       Relevant Orders   EKG 12-Lead   Drugs of abuse scrn w alc, routine urine   Prolactin   Hyperlipidemia    Chronic, ongoing.  Continue current medication regimen and adjust as needed.  Lipid panel today.      Relevant Orders   Lipid Panel w/o Chol/HDL Ratio   Morbid obesity (HCC)    BMI 46.09 with HTN, CKD.  Recommended eating smaller high protein, low fat meals more frequently and exercising 30 mins a day 5 times a week with a goal of 10-15lb weight loss in the next 3 months. Patient voiced their understanding and motivation to adhere to these recommendations.       Schizoaffective disorder, bipolar type (HCC) - Primary    Chronic, stable, followed by psychiatry.  Continue current medication regimen as prescribed by them, recent note reviewed.  Denies SI/HI.  Will  obtain EKG and Prolactin level + UDS for psychiatry today -- will send results to them.      Relevant Orders   EKG  12-Lead   Drugs of abuse scrn w alc, routine urine   Prolactin     Follow up plan: Return in about 6 months (around 01/05/2024) for Annual Physical after 01/03/24.

## 2023-07-02 NOTE — Assessment & Plan Note (Signed)
Chronic, stable. BP at goal in office today.  Continue current medication regimen and collaboration with nephrology.  Lisinopril for kidney protection, refills up to date.  Recommend checking BP at home on occasion and documenting.  Labs today: A1c.   Return in 6 months.

## 2023-07-02 NOTE — Progress Notes (Signed)
Contacted via MyChart   Good afternoon [employer].  A1c returned.  This has trended down from 6.1% to 6%.  The A1C is the diabetes testing we talked about, this looks at your blood sugars over the past 3 months and turns the average into a number.  Any number 5.7 to 6.4 is considered prediabetes and any number 6.5 or greater is considered diabetes.   I would recommend heavy focus on decreasing foods high in sugar and your intake of things like bread products, pasta, and rice.  The American Diabetes Association online has a large amount of information on diet changes to make.  We will recheck this number in 6 months to ensure you are not continuing to trend upwards and move into diabetes.  Have a good day.:)

## 2023-07-02 NOTE — Assessment & Plan Note (Addendum)
Chronic, stable.  Continue collaboration with nephrology and Lisinopril for kidney protection, recent notes reviewed.  Labs up to date with nephrology.

## 2023-07-02 NOTE — Assessment & Plan Note (Signed)
Ongoing per nephrology notes.  Continue collaboration with nephrology, recent note reviewed.  Labs up to date with nephrology.

## 2023-07-03 ENCOUNTER — Ambulatory Visit: Payer: 59 | Admitting: Nurse Practitioner

## 2023-07-03 ENCOUNTER — Other Ambulatory Visit: Payer: Self-pay | Admitting: Nurse Practitioner

## 2023-07-03 MED ORDER — ICOSAPENT ETHYL 1 G PO CAPS: 2.0000 g | ORAL_CAPSULE | Freq: Two times a day (BID) | ORAL | 4 refills | Status: DC

## 2023-07-03 NOTE — Progress Notes (Signed)
Contacted via MyChart   Good morning Rita [name], your labs have returned.  Cholesterol labs show ongoing elevation in triglycerides, but remainder are stable levels. Continue Rosuvastatin and I am sending in some medication to help lower triglycerides as well.  Prolactin is normal, I will send to psychiatry.  Any questions? Keep being amazing!!  Thank you for allowing me to participate in your care.  I appreciate you. Kindest regards, Emile Kyllo

## 2023-07-03 NOTE — Progress Notes (Signed)
Good morning -- Prolactin level is available in chart as well as EKG which showed no QT prolongation.  UDS is pending:)  Have a great day!!

## 2023-07-23 ENCOUNTER — Telehealth: Payer: Self-pay | Admitting: Nurse Practitioner

## 2023-07-23 NOTE — Telephone Encounter (Signed)
Copied from CRM [id number]. Topic: General - Other >> Jul 23, 2023 12:05 PM Phill Myron wrote: [name] the Caregiver called and stated patient can not have weight loss medication due to her medical condition

## 2023-07-23 NOTE — Telephone Encounter (Signed)
Patient will need an appointment to start any new medications.

## 2023-07-23 NOTE — Telephone Encounter (Signed)
Called to schedule appt. Call was disconnected. Put in CRM to have patient call and schedule an appointment for weight loss medication

## 2023-07-23 NOTE — Telephone Encounter (Signed)
Copied from CRM [id number]. Topic: General - Other >> Jul 23, 2023 10:44 AM Franchot Heidelberg wrote: Reason for CRM: Pt called requesting to see PCP, she wants to start medication to help her lose weight. She wants [place] to contact her caregiver and state that she needs an appt. She does not want anyone to know that she called.   Best contact: [phone] [name]   Pt says "please dont tell anyone I called you, because I am not supposed to call"

## 2023-07-28 NOTE — Telephone Encounter (Signed)
Copied from CRM [id number]. Topic: General - Other >> Jul 25, 2023  4:12 PM Turkey B wrote: Reason for CRM: caller , guardian of pt called in ,states was told that her pt insurance wouldn't be accepted anymore in May for [facility], but that doesn't seem to be true. Please confirm that her insurance of [facility] medicare will still be accepted

## 2023-07-28 NOTE — Telephone Encounter (Signed)
Called it inform patients guardian to disregard the letter.[employer] is accepted.

## 2023-08-12 ENCOUNTER — Telehealth: Payer: Self-pay

## 2023-08-12 DIAGNOSIS — G4700 Insomnia, unspecified: Secondary | ICD-10-CM

## 2023-08-12 DIAGNOSIS — F250 Schizoaffective disorder, bipolar type: Secondary | ICD-10-CM

## 2023-08-12 DIAGNOSIS — F79 Unspecified intellectual disabilities: Secondary | ICD-10-CM

## 2023-08-12 MED ORDER — DULOXETINE HCL 60 MG PO CPEP
ORAL_CAPSULE | ORAL | 5 refills | Status: DC
Start: 2023-08-12 — End: 2024-01-26

## 2023-08-12 MED ORDER — CLONAZEPAM 0.5 MG PO TABS
0.5000 mg | ORAL_TABLET | Freq: Two times a day (BID) | ORAL | 5 refills | Status: DC
Start: 2023-08-12 — End: 2024-01-05

## 2023-08-12 MED ORDER — TRAZODONE HCL 150 MG PO TABS
ORAL_TABLET | ORAL | 5 refills | Status: DC
Start: 2023-08-12 — End: 2024-01-05

## 2023-08-12 MED ORDER — QUETIAPINE FUMARATE 100 MG PO TABS
100.0000 mg | ORAL_TABLET | Freq: Every day | ORAL | 5 refills | Status: DC
Start: 2023-08-12 — End: 2024-01-26

## 2023-08-12 MED ORDER — DULOXETINE HCL 30 MG PO CPEP: 30.0000 mg | ORAL_CAPSULE | Freq: Every day | ORAL | 5 refills | Status: DC

## 2023-08-12 NOTE — Telephone Encounter (Signed)
pt caregiver notified

## 2023-08-12 NOTE — Telephone Encounter (Signed)
pt called states she needs medication refills on clonazepam, duloxetine 30mg  and the 60mg  and quetiapine and the trazodone. please send to [facility]. pt was last seen on 7-29 next appt 10-28

## 2023-08-12 NOTE — Telephone Encounter (Signed)
I have sent all requested medications to [facility].

## 2023-09-21 ENCOUNTER — Encounter: Payer: Self-pay | Admitting: Nurse Practitioner

## 2023-09-22 ENCOUNTER — Other Ambulatory Visit: Payer: Self-pay | Admitting: Nurse Practitioner

## 2023-09-22 DIAGNOSIS — Z1231 Encounter for screening mammogram for malignant neoplasm of breast: Secondary | ICD-10-CM

## 2023-09-29 ENCOUNTER — Ambulatory Visit (INDEPENDENT_AMBULATORY_CARE_PROVIDER_SITE_OTHER): Payer: 59 | Admitting: Psychiatry

## 2023-09-29 ENCOUNTER — Encounter: Payer: Self-pay | Admitting: Psychiatry

## 2023-09-29 VITALS — BP 134/80 | HR 80 | Temp 96.4°F | Ht 59.0 in | Wt 229.4 lb

## 2023-09-29 DIAGNOSIS — G4709 Other insomnia: Secondary | ICD-10-CM

## 2023-09-29 DIAGNOSIS — F70 Mild intellectual disabilities: Secondary | ICD-10-CM

## 2023-09-29 DIAGNOSIS — F250 Schizoaffective disorder, bipolar type: Secondary | ICD-10-CM

## 2023-09-29 NOTE — Progress Notes (Unsigned)
BH MD OP Progress Note  09/29/2023 4:56 PM Rita [name]  MRN:  [id number]  Chief Complaint:  Chief Complaint  Patient presents with   Follow-up   Depression   Anxiety   Medication Refill   HPI: Rita [name] is a 62 year old Caucasian female, single, on SSD, has a history of schizoaffective disorder bipolar type, intellectual disability mild, chronic kidney disease stage IIIb, hypertension, secondary hyperparathyroidism, migraine headaches, lives in [place], in an adult family living Home was evaluated in office today.  Patient presented with caretaker [name].  Collateral information was obtained from information provided by legal guardian and sister Ms. Lurlean Nanny.  Patient today appeared to be calm, cooperative.  Was able to answer questions appropriately.  Patient appeared to be alert, oriented to person place time and situation.  3 word memory immediate 3 out of 3, after 5 minutes 3 out of 3.  Patient was able to say the date, the place correctly.  Patient was able to give her caretakers phone number correctly.  Patient reports she currently does not have her phone or her tablet which she left when she went on vacation to her dad's place.  Patient also with recent trouble with following parental restrictions on her phone and tablet.  According to caretaker patient is currently working on finding activities that she can do by not focusing too much on not having her phone with her anymore.  She has been managing okay overall.  She does seem to be following redirection.  She enjoyed her vacation with her family which she took during her fall break.  This was 3 weeks ago.  She is currently compliant on medications.  Denies side effects.  Patient reports sleep and appetite is fair.  Patient denies any suicidality, homicidality or perceptual disturbances.  Patient denies any other concerns today.    Visit Diagnosis:    ICD-10-CM   1. Schizoaffective disorder, bipolar type (HCC)  F25.0      2. Other insomnia  G47.09    Likely due to lack of sleep hygiene    3. Mild intellectual disability  F70    IQ 5      Past Psychiatric History: I have reviewed past psychiatric history from progress note on 06/30/2023.  Past trials of medications like multiple including Abilify, Seroquel, trazodone, Cymbalta, Wellbutrin, Topamax.  Past Medical History:  Past Medical History:  Diagnosis Date   Adenomatous polyps    Anxiety    Chronic constipation    Chronic tension headaches    CKD (chronic kidney disease) stage 3, GFR 30-59 ml/min (HCC)    Depression    Factitious disorder    GERD (gastroesophageal reflux disease)    Hyperlipidemia    Hypertension    Intellectual disability due to developmental disorder, unspecified    Mental retardation    Microscopic hematuria    Migraine    Obesity     Past Surgical History:  Procedure Laterality Date   CARPAL TUNNEL RELEASE     COLONOSCOPY WITH PROPOFOL N/A 06/27/2017   Procedure: COLONOSCOPY WITH PROPOFOL;  Surgeon: Scot Jun, MD;  Location: [facility] ENDOSCOPY;  Service: Endoscopy;  Laterality: N/A;   COLONOSCOPY WITH PROPOFOL N/A 07/23/2022   Procedure: COLONOSCOPY WITH PROPOFOL;  Surgeon: Wyline Mood, MD;  Location: [facility] ENDOSCOPY;  Service: Gastroenterology;  Laterality: N/A;   CYSTOSCOPY W/ RETROGRADES Bilateral 11/15/2015   Procedure: CYSTOSCOPY WITH RETROGRADE PYELOGRAM;  Surgeon: Vanna Scotland, MD;  Location: [facility] ORS;  Service: Urology;  Laterality: Bilateral;  US ECHOCARDIOGRAPHY  2012   showed mild heart failure (LVEF 52%); small pericardial effusion    Family Psychiatric History: I have reviewed family psychiatric history from progress note on 06/30/2023.  Family History:  Family History  Problem Relation Age of Onset   Heart disease Father    Hyperlipidemia Father    Kidney disease Father    Breast cancer Maternal Aunt    Prostate cancer Neg Hx     Social History: I have reviewed social history from progress  note on 06/30/2023. Social History   Socioeconomic History   Marital status: Single    Spouse name: Not on file   Number of children: Not on file   Years of education: Not on file   Highest education level: 12th grade  Occupational History   Occupation: student  Tobacco Use   Smoking status: Never   Smokeless tobacco: Never  Vaping Use   Vaping status: Never Used  Substance and Sexual Activity   Alcohol use: No   Drug use: No   Sexual activity: Never  Other Topics Concern   Not on file  Social History Narrative   Goes to [employer]   Social Determinants of Health   Financial Resource Strain: Low Risk  (06/30/2023)   Overall Financial Resource Strain (CARDIA)    Difficulty of Paying Living Expenses: Not hard at all  Food Insecurity: No Food Insecurity (06/30/2023)   Hunger Vital Sign    Worried About Running Out of Food in the Last Year: Never true    Ran Out of Food in the Last Year: Never true  Transportation Needs: No Transportation Needs (06/30/2023)   PRAPARE - Administrator, Civil Service (Medical): No    Lack of Transportation (Non-Medical): No  Physical Activity: Insufficiently Active (06/30/2023)   Exercise Vital Sign    Days of Exercise per Week: 1 day    Minutes of Exercise per Session: 20 min  Stress: No Stress Concern Present (06/30/2023)   [employer] of Occupational Health - Occupational Stress Questionnaire    Feeling of Stress : Only a little  Social Connections: Moderately Integrated (06/30/2023)   Social Connection and Isolation Panel [NHANES]    Frequency of Communication with Friends and Family: More than three times a week    Frequency of Social Gatherings with Friends and Family: More than three times a week    Attends Religious Services: More than 4 times per year    Active Member of [employer] or Organizations: No    Attends Engineer, structural: More than 4 times per year    Marital Status: Never married    Allergies:   Allergies  Allergen Reactions   Advil [Ibuprofen] Other (See Comments)    Reaction:  Gives her kidney problems     Metabolic Disorder Labs: Lab Results  Component Value Date   HGBA1C 6.0 (H) 07/02/2023   Lab Results  Component Value Date   PROLACTIN 12.0 07/02/2023   Lab Results  Component Value Date   CHOL 166 07/02/2023   TRIG 242 (H) 07/02/2023   HDL 55 07/02/2023   CHOLHDL 4.5 (H) 07/14/2018   VLDL 62 (H) 04/25/2020   LDLCALC 72 07/02/2023   LDLCALC 73 01/02/2023   Lab Results  Component Value Date   TSH 1.650 01/02/2023   TSH 1.520 01/01/2022    Therapeutic Level Labs: No results found for: "LITHIUM" No results found for: "VALPROATE" No results found for: "CBMZ"  Current Medications: Current Outpatient Medications  Medication Sig Dispense Refill   Acetaminophen Extra Strength 500 MG TABS TAKE (2) TABLETS BY MOUTH EVERY SIX HOURS AS NEEDED. 240 tablet 4   aspirin 81 MG chewable tablet Chew 1 tablet (81 mg total) by mouth daily. 30 tablet 12   carvedilol (COREG) 6.25 MG tablet Take 6.25 mg by mouth 2 (two) times daily.     cholecalciferol (VITAMIN D3) 25 MCG (1000 UNIT) tablet TAKE (2) TABLETS BY MOUTH ONCE DAILY. 180 tablet 4   clonazePAM (KLONOPIN) 0.5 MG tablet Take 1 tablet (0.5 mg total) by mouth 2 (two) times daily. 60 tablet 5   diclofenac Sodium (VOLTAREN) 1 % GEL Apply 2 g topically 3 (three) times daily. 100 g 4   docusate sodium (COLACE) 100 MG capsule TAKE (1) CAPSULE BY MOUTH TWICE DAILY AS NEEDED. 180 capsule 4   DULoxetine (CYMBALTA) 30 MG capsule Take 1 capsule (30 mg total) by mouth at bedtime. Take along with 60 mg daily , total of 90 mg 30 capsule 5   DULoxetine (CYMBALTA) 60 MG capsule TAKE (1) CAPSULE BY MOUTH ONCE DAILY.TAKE ALONG WITH 30 MG, TOTAL OF 90 MG DAILY 30 capsule 5   icosapent Ethyl (VASCEPA) 1 g capsule Take 2 capsules (2 g total) by mouth 2 (two) times daily. 360 capsule 4   lisinopril (ZESTRIL) 5 MG tablet Take 1 tablet (5 mg  total) by mouth daily. 90 tablet 4   magnesium oxide (MAG-OX) 400 (240 Mg) MG tablet Take 1 tablet (400 mg total) by mouth daily. 30 tablet 12   Multiple Vitamin (TAB-A-VITE) TABS TAKE ONE TABLET BY MOUTH EVERY DAY 30 tablet 12   omeprazole (PRILOSEC) 20 MG capsule TAKE (1) CAPSULE BY MOUTH ONCE DAILY. 90 capsule 4   QUEtiapine (SEROQUEL) 100 MG tablet Take 1 tablet (100 mg total) by mouth at bedtime. 30 tablet 5   rosuvastatin (CRESTOR) 40 MG tablet Take 1 tablet (40 mg total) by mouth daily. for cholesterol 90 tablet 4   sodium fluoride (FLUORISHIELD) 1.1 % GEL dental gel Take by mouth.     SUMAtriptan (IMITREX) 100 MG tablet Take by mouth.     traZODone (DESYREL) 150 MG tablet TAKE (1) TABLET BY MOUTH DAILY AT BEDTIME. 30 tablet 5   GNP COUGH DM ER 30 MG/5ML liquid GIVE 2.5MLS BY MOUTH AS NEEDED FOR COUGH. (Patient not taking: Reported on 09/29/2023) 30 mL 0   No current facility-administered medications for this visit.     Musculoskeletal: Strength & Muscle Tone: within normal limits Gait & Station: normal Patient leans: N/A  Psychiatric Specialty Exam: Review of Systems  Psychiatric/Behavioral: Negative.      Blood pressure 134/80, pulse 80, temperature (!) 96.4 F (35.8 C), temperature source Skin, height 4\' 11"  (1.499 m), weight 229 lb 6.4 oz (104.1 kg).Body mass index is 46.33 kg/m.  General Appearance: Fairly Groomed  Eye Contact:  Fair  Speech:  Clear and Coherent  Volume:  Normal  Mood:  Euthymic  Affect:  Full Range  Thought Process:  Goal Directed and Descriptions of Associations: Intact  Orientation:  Full (Time, Place, and Person)  Thought Content: Logical   Suicidal Thoughts:  No  Homicidal Thoughts:  No  Memory:  Immediate;   Fair Recent;   Fair Remote;   Fair  Judgement:  Fair  Insight:  Fair  Psychomotor Activity:  Normal  Concentration:  Concentration: Fair and Attention Span: Fair  Recall:  [employer] of Knowledge: Fair  Language: Fair  Akathisia:  No  Handed:  Right  AIMS (if indicated): done  Assets:  Communication Skills Desire for Improvement Housing Social Support Transportation  ADL's:  Intact  Cognition: WNL  Sleep:  Fair   Screenings: AIMS    Flowsheet Row Office Visit from 06/30/2023 in [facility]  AIMS Total Score 0      GAD-7    Flowsheet Row Office Visit from 09/29/2023 in [facility] Office Visit from 07/02/2023 in [facility] Office Visit from 06/30/2023 in [facility] Office Visit from 01/02/2023 in [facility] Office Visit from 05/28/2022 in [facility]  Total GAD-7 Score 1 0 0 0 1      PHQ2-9    Flowsheet Row Office Visit from 09/29/2023 in [facility] Office Visit from 07/02/2023 in [facility] Office Visit from 06/30/2023 in [facility] Office Visit from 01/02/2023 in [facility] Clinical Support from 11/12/2022 in [facility]  PHQ-2 Total Score 0 0 0 0 0  PHQ-9 Total Score -- 0 0 0 0      Flowsheet Row Office Visit from 09/29/2023 in [facility] Office Visit from 06/30/2023 in [facility] Admission (Discharged) from 07/23/2022 in [facility]  C-SSRS RISK CATEGORY No Risk No Risk No Risk        Assessment and Plan: ESHAAL SKILLINGS is a 62 year old Caucasian female, single, on SSD, has a history of schizoaffective disorder, intellectual disability mild, multiple medical problems including chronic kidney disease, lives in [place] in an adult family living Home was evaluated in office today for a follow-up appointment.  Patient currently stable on current  medication regimen.  Plan as noted below.  Plan  Schizoaffective disorder bipolar type-stable Seroquel 100 mg p.o. nightly Duloxetine 90 mg p.o. daily at bedtime Klonopin 0.5 mg p.o. twice daily Reviewed [employer] PMP AWARxE  Insomnia-improving Trazodone 150 mg at bedtime Patient to work on sleep hygiene  Intellectual disability mild-chronic-currently has good social support system.  Patient is also in the able program at [facility].  At risk for prolonged QT syndrome-reviewed EKG dated 07/02/2023 and discussed with patient ,sinus rhythm, [id number]   Consent: Patient/Guardian gives verbal consent for treatment and assignment of benefits for services provided during this visit. Patient/Guardian expressed understanding and agreed to proceed.   Collateral information obtained from caretaker [name] as well as reviewed letter sent in by legal guardian Ms. Lurlean Nanny -sister, who provided brief summary of patient's presentation in the past including psychological testing report-per Dr. Maisie Fus Thompson-dated December 15, 2007-patient with IQ-60-diagnosed with intellectual disability mild.  Patient also was diagnosed with factitious disorder for seizure-like behaviors which led to emergency department visits in the past.  I have spent atleast 40 minutes face to face with patient today which includes the time spent for preparing to see the patient ( e.g., review of test, records ), obtaining and to review and separately obtained history , ordering medications ,psychoeducation and supportive psychotherapy and care coordination,as well as documenting clinical information in electronic health record,interpreting and communication of test results.  This note was generated in part or whole with voice recognition software. Voice recognition is usually quite accurate but there are transcription errors that can and very often do occur. I apologize for any typographical  errors that were not detected and corrected.     Jomarie Longs, MD 09/29/2023, 4:56 PM

## 2023-10-13 ENCOUNTER — Ambulatory Visit
Admission: RE | Admit: 2023-10-13 | Discharge: 2023-10-13 | Disposition: A | Discharge status: Home / Self Care | Payer: 59 | Source: Ambulatory Visit | Attending: Nurse Practitioner | Admitting: Nurse Practitioner

## 2023-10-13 DIAGNOSIS — Z1231 Encounter for screening mammogram for malignant neoplasm of breast: Secondary | ICD-10-CM | POA: Insufficient documentation

## 2023-12-03 ENCOUNTER — Encounter: Payer: Self-pay | Admitting: Nurse Practitioner

## 2023-12-09 ENCOUNTER — Ambulatory Visit: Payer: 59 | Admitting: Emergency Medicine

## 2023-12-09 VITALS — Ht <= 58 in | Wt 230.0 lb

## 2023-12-09 DIAGNOSIS — Z0000 Encounter for general adult medical examination without abnormal findings: Secondary | ICD-10-CM | POA: Diagnosis not present

## 2023-12-09 NOTE — Progress Notes (Signed)
 Subjective:   Rita [name] is a 63 y.o. female who presents for Medicare Annual (Subsequent) preventive examination.  Visit Complete: Virtual I connected with  Rita [name] on 12/09/23 by a video and audio enabled telemedicine application and verified that I am speaking with the correct person using two identifiers.  Patient Location: Home  Provider Location: Office/Clinic  I discussed the limitations of evaluation and management by telemedicine. The patient expressed understanding and agreed to proceed.  Vital Signs: Because this visit was a virtual/telehealth visit, some criteria may be missing or patient reported. Any vitals not documented were not able to be obtained and vitals that have been documented are patient reported.   Cardiac Risk Factors include: dyslipidemia;hypertension;obesity (BMI >30kg/m2)     Objective:    Today's Vitals   12/09/23 0922  Weight: 230 lb (104.3 kg)  Height: 4' 9 (1.448 m)   Body mass index is 49.77 kg/m.     12/09/2023    9:38 AM 11/12/2022    8:50 AM 10/24/2021   10:03 AM 10/23/2020    9:54 AM 10/20/2019    9:38 AM 12/17/2018    1:14 PM 07/02/2018    8:30 AM  Advanced Directives  Does Patient Have a Medical Advance Directive? Yes Yes Yes Yes Yes  Yes  Type of Estate Agent of [place];Living will Healthcare Power of [employer] Power of [employer] Power of [place];Living will Living will;Healthcare Power of [employer] Power of [place];Living will  Does patient want to make changes to medical advance directive? No - Patient declined        Copy of Healthcare Power of Attorney in Chart? No - copy requested Yes - validated most recent copy scanned in chart (See row information) Yes - validated most recent copy scanned in chart (See row information) No - copy requested Yes - validated most recent copy scanned in chart (See row information)  Yes     Information is confidential and restricted.  Go to Review Flowsheets to unlock data.     Current Medications (verified) Outpatient Encounter Medications as of 12/09/2023  Medication Sig   Acetaminophen  Extra Strength 500 MG TABS TAKE (2) TABLETS BY MOUTH EVERY SIX HOURS AS NEEDED.   aspirin  81 MG chewable tablet Chew 1 tablet (81 mg total) by mouth daily.   carvedilol (COREG) 6.25 MG tablet Take 6.25 mg by mouth 2 (two) times daily.   cholecalciferol  (VITAMIN D3) 25 MCG (1000 UNIT) tablet TAKE (2) TABLETS BY MOUTH ONCE DAILY.   clonazePAM  (KLONOPIN ) 0.5 MG tablet Take 1 tablet (0.5 mg total) by mouth 2 (two) times daily.   diclofenac  Sodium (VOLTAREN ) 1 % GEL Apply 2 g topically 3 (three) times daily.   docusate sodium  (COLACE) 100 MG capsule TAKE (1) CAPSULE BY MOUTH TWICE DAILY AS NEEDED.   DULoxetine  (CYMBALTA ) 30 MG capsule Take 1 capsule (30 mg total) by mouth at bedtime. Take along with 60 mg daily , total of 90 mg   DULoxetine  (CYMBALTA ) 60 MG capsule TAKE (1) CAPSULE BY MOUTH ONCE DAILY.TAKE ALONG WITH 30 MG, TOTAL OF 90 MG DAILY   GNP COUGH DM ER 30 MG/5ML liquid GIVE 2.5MLS BY MOUTH AS NEEDED FOR COUGH.   icosapent  Ethyl (VASCEPA ) 1 g capsule Take 2 capsules (2 g total) by mouth 2 (two) times daily.   lisinopril  (ZESTRIL ) 5 MG tablet Take 1 tablet (5 mg total) by mouth daily.   magnesium  oxide (MAG-OX) 400 (240 Mg) MG tablet  Take 1 tablet (400 mg total) by mouth daily.   Multiple Vitamin (TAB-A-VITE) TABS TAKE ONE TABLET BY MOUTH EVERY DAY   omeprazole  (PRILOSEC) 20 MG capsule TAKE (1) CAPSULE BY MOUTH ONCE DAILY.   QUEtiapine  (SEROQUEL ) 100 MG tablet Take 1 tablet (100 mg total) by mouth at bedtime.   rosuvastatin  (CRESTOR ) 40 MG tablet Take 1 tablet (40 mg total) by mouth daily. for cholesterol   sodium fluoride (FLUORISHIELD) 1.1 % GEL dental gel Take by mouth.   SUMAtriptan  (IMITREX ) 100 MG tablet Take by mouth.   traZODone  (DESYREL ) 150 MG tablet TAKE (1) TABLET BY MOUTH DAILY AT BEDTIME.   No facility-administered  encounter medications on file as of 12/09/2023.    Allergies (verified) Advil [ibuprofen]   History: Past Medical History:  Diagnosis Date   Adenomatous polyps    Anxiety    Chronic constipation    Chronic tension headaches    CKD (chronic kidney disease) stage 3, GFR 30-59 ml/min (HCC)    Depression    Factitious disorder    GERD (gastroesophageal reflux disease)    Hyperlipidemia    Hypertension    Intellectual disability due to developmental disorder, unspecified    Mental retardation    Microscopic hematuria    Migraine    Obesity    Past Surgical History:  Procedure Laterality Date   CARPAL TUNNEL RELEASE     COLONOSCOPY WITH PROPOFOL  N/A 06/27/2017   Procedure: COLONOSCOPY WITH PROPOFOL ;  Surgeon: Viktoria Lamar DASEN, MD;  Location: [facility] ENDOSCOPY;  Service: Endoscopy;  Laterality: N/A;   COLONOSCOPY WITH PROPOFOL  N/A 07/23/2022   Procedure: COLONOSCOPY WITH PROPOFOL ;  Surgeon: Therisa Bi, MD;  Location: [facility] ENDOSCOPY;  Service: Gastroenterology;  Laterality: N/A;   CYSTOSCOPY W/ RETROGRADES Bilateral 11/15/2015   Procedure: CYSTOSCOPY WITH RETROGRADE PYELOGRAM;  Surgeon: Rosina Riis, MD;  Location: [facility] ORS;  Service: Urology;  Laterality: Bilateral;   US  ECHOCARDIOGRAPHY  2012   showed mild heart failure (LVEF 52%); small pericardial effusion   Family History  Problem Relation Age of Onset   COPD Mother    Other Mother 61       old age   Dementia Mother    Heart disease Father    Hyperlipidemia Father    Kidney disease Father    Breast cancer Maternal Aunt    Prostate cancer Neg Hx    Social History   Socioeconomic History   Marital status: Single    Spouse name: Not on file   Number of children: Not on file   Years of education: Not on file   Highest education level: 12th grade  Occupational History   Occupation: consulting civil engineer   Occupation: disability  Tobacco Use   Smoking status: Never   Smokeless tobacco: Never  Vaping Use   Vaping status: Never  Used  Substance and Sexual Activity   Alcohol use: No   Drug use: No   Sexual activity: Never  Other Topics Concern   Not on file  Social History Narrative   Goes to [employer]   Social Drivers of Health   Financial Resource Strain: Low Risk  (12/09/2023)   Overall Financial Resource Strain (CARDIA)    Difficulty of Paying Living Expenses: Not hard at all  Food Insecurity: No Food Insecurity (12/09/2023)   Hunger Vital Sign    Worried About Running Out of Food in the Last Year: Never true    Ran Out of Food in the Last Year: Never true  Transportation Needs: No Transportation  Needs (12/09/2023)   PRAPARE - Administrator, Civil Service (Medical): No    Lack of Transportation (Non-Medical): No  Physical Activity: Inactive (12/09/2023)   Exercise Vital Sign    Days of Exercise per Week: 0 days    Minutes of Exercise per Session: 0 min  Stress: No Stress Concern Present (12/09/2023)   [employer] of Occupational Health - Occupational Stress Questionnaire    Feeling of Stress : Only a little  Social Connections: Moderately Integrated (12/09/2023)   Social Connection and Isolation Panel [NHANES]    Frequency of Communication with Friends and Family: More than three times a week    Frequency of Social Gatherings with Friends and Family: More than three times a week    Attends Religious Services: More than 4 times per year    Active Member of [employer] or Organizations: Yes    Attends Banker Meetings: Never    Marital Status: Never married    Tobacco Counseling Counseling given: Not Answered   Clinical Intake:  Pre-visit preparation completed: Yes  Pain : No/denies pain     BMI - recorded: 49.77 Nutritional Status: BMI > 30  Obese Nutritional Risks: None Diabetes: No  How often do you need to have someone help you when you read instructions, pamphlets, or other written materials from your doctor or pharmacy?: 1 - Never  Interpreter Needed?:  No  Information entered by :: Vina Ned, CMA   Activities of Daily Living    12/09/2023    9:25 AM 01/02/2023    9:52 AM  In your present state of health, do you have any difficulty performing the following activities:  Hearing? 0 0  Vision? 0 0  Difficulty concentrating or making decisions? 1 0  Comment sister and caregivers make decisions for patient's safety   Walking or climbing stairs? 0 0  Dressing or bathing? 0 0  Doing errands, shopping? 1 0  Comment caregivers take to her appointments   Preparing Food and eating ? N   Using the Toilet? N   In the past six months, have you accidently leaked urine? Y   Comment wears pull-ups   Do you have problems with loss of bowel control? N   Managing your Medications? Y   Comment caregiver handles medications   Managing your Finances? Y   Comment sister Chief Strategy Officer or managing your Housekeeping? Y     Patient Care Team: Cannady, Jolene T, NP as PCP - General (Nurse Practitioner) Daphane Rosella, NP (Inactive) as PCP - Family Medicine (Nurse Practitioner) Lane Arthea BRAVO, MD as Referring Physician (Neurology) Clapacs, Norleen DASEN, MD (Inactive) (Psychiatry) Lateef, Munsoor, MD (Internal Medicine)  Indicate any recent Medical Services you may have received from other than [facility] providers in the past year (date may be approximate).     Assessment:   This is a routine wellness examination for Roca.  Hearing/Vision screen Hearing Screening - Comments:: Denies hearing loss Vision Screening - Comments:: Gets eye exams   Goals Addressed             This Visit's Progress    Exercise 3x per week (30 min per time)       [facility]      Depression Screen    12/09/2023    9:35 AM 09/29/2023    9:23 AM 07/02/2023    8:45 AM 06/30/2023    9:52 AM 01/02/2023    9:58 AM 11/12/2022  8:42 AM 05/28/2022   11:05 AM  PHQ 2/9 Scores  PHQ - 2 Score 0  0  0 0 0  PHQ- 9 Score   0  0 0 2     Information is  confidential and restricted. Go to Review Flowsheets to unlock data.     Fall Risk    12/09/2023    9:39 AM 07/02/2023    8:45 AM 01/02/2023    9:59 AM 11/12/2022    8:38 AM 05/28/2022   11:05 AM  Fall Risk   Falls in the past year? 0 0 0 0 0  Number falls in past yr: 0 0 0 0 0  Injury with Fall? 0 0 0 0 0  Risk for fall due to : No Fall Risks No Fall Risks No Fall Risks    Follow up Falls prevention discussed Falls evaluation completed Falls prevention discussed Falls evaluation completed;Education provided;Falls prevention discussed     MEDICARE RISK AT HOME: Medicare Risk at Home Any stairs in or around the home?: Yes If so, are there any without handrails?: No Home free of loose throw rugs in walkways, pet beds, electrical cords, etc?: Yes Adequate lighting in your home to reduce risk of falls?: Yes Life alert?: No Use of a cane, walker or w/c?: No Grab bars in the bathroom?: Yes Shower chair or bench in shower?: No Elevated toilet seat or a handicapped toilet?: Yes  TIMED UP AND GO:  Was the test performed?  No    Cognitive Function:        12/09/2023    9:40 AM 11/12/2022    8:43 AM 10/23/2020   10:01 AM 07/02/2018    8:31 AM 07/02/2017    8:24 AM  6CIT Screen  What Year? 0 points 0 points 0 points 0 points 0 points  What month? 0 points 0 points 0 points 0 points 0 points  What time? 0 points 0 points 3 points 0 points 0 points  Count back from 20 0 points 2 points 0 points 0 points 2 points  Months in reverse 0 points 4 points 0 points 0 points 0 points  Repeat phrase 0 points 4 points 4 points 0 points 2 points  Total Score 0 points 10 points 7 points 0 points 4 points    Immunizations Immunization History  Administered Date(s) Administered   Covaxin SARS-COV-2(Non US ) 11/26/2022   Influenza,inj,Quad PF,6+ Mos 09/20/2015, 10/14/2016, 09/26/2017, 10/02/2018, 08/10/2019, 09/13/2020   Influenza-Unspecified 08/26/2014, 10/01/2021, 09/12/2022   Moderna  Sars-Covid-2 Vaccination 12/17/2019, 01/14/2020, 10/02/2020, 10/01/2021   RSV,unspecified 10/28/2022   Td 10/20/2008, 03/26/2021   Zoster Recombinant(Shingrix) 02/02/2021, 05/04/2021    TDAP status: Up to date  Flu Vaccine status: Up to date  Pneumococcal vaccine status: Due, Education has been provided regarding the importance of this vaccine. Advised may receive this vaccine at local pharmacy or Health Dept. Aware to provide a copy of the vaccination record if obtained from local pharmacy or Health Dept. Verbalized acceptance and understanding.  Covid-19 vaccine status: Completed vaccines  Qualifies for Shingles Vaccine? Yes   Zostavax completed No   Shingrix Completed?: Yes  Screening Tests Health Maintenance  Topic Date Due   Cervical Cancer Screening (HPV/Pap Cotest)  Never done   Medicare Annual Wellness (AWV)  12/08/2024   MAMMOGRAM  10/12/2025   Colonoscopy  07/23/2029   DTaP/Tdap/Td (3 - Tdap) 03/27/2031   INFLUENZA VACCINE  Completed   COVID-19 Vaccine  Completed   Hepatitis C Screening  Completed  HIV Screening  Completed   Zoster Vaccines- Shingrix  Completed   HPV VACCINES  Aged Out    Health Maintenance  Health Maintenance Due  Topic Date Due   Cervical Cancer Screening (HPV/Pap Cotest)  Never done    Colorectal cancer screening: Type of screening: Colonoscopy. Completed 07/23/22. Repeat every 7 years  Mammogram status: Completed 10/13/23. Repeat every year   Lung Cancer Screening: (Low Dose CT Chest recommended if Age 57-80 years, 20 pack-year currently smoking OR have quit w/in 15years.) does not qualify.   Lung Cancer Screening Referral: n/a  Additional Screening:  Hepatitis C Screening: does not qualify; Completed 07/09/16  Vision Screening: Recommended annual ophthalmology exams for early detection of glaucoma and other disorders of the eye.  Dental Screening: Recommended annual dental exams for proper oral hygiene    Community Resource  Referral / Chronic Care Management: CRR required this visit?  No   CCM required this visit?  No     Plan:     I have personally reviewed and noted the following in the patient's chart:   Medical and social history Use of alcohol, tobacco or illicit drugs  Current medications and supplements including opioid prescriptions. Patient is not currently taking opioid prescriptions. Functional ability and status Nutritional status Physical activity Advanced directives List of other physicians Hospitalizations, surgeries, and ER visits in previous 12 months Vitals Screenings to include cognitive, depression, and falls Referrals and appointments  In addition, I have reviewed and discussed with patient certain preventive protocols, quality metrics, and best practice recommendations. A written personalized care plan for preventive services as well as general preventive health recommendations were provided to patient.     Vina Ned, CMA   12/09/2023   After Visit Summary: (MyChart) Due to this being a telephonic visit, the after visit summary with patients personalized plan was offered to patient via MyChart   Nurse Notes:  Shar[place], caregiver was with patient for today's visit.

## 2023-12-09 NOTE — Patient Instructions (Addendum)
 [name] , Thank you for taking time to come for your Medicare Wellness Visit. I appreciate your ongoing commitment to your health goals. Please review the following plan we discussed and let me know if I can assist you in the future.   Referrals/Orders/Follow-Ups/Clinician Recommendations: Try to exercise more! Keep up the good work!  This is a list of the screening recommended for you and due dates:  Health Maintenance  Topic Date Due   Pap with HPV screening  Never done   Mammogram  10/12/2024   Medicare Annual Wellness Visit  12/08/2024   Colon Cancer Screening  07/23/2029   DTaP/Tdap/Td vaccine (3 - Tdap) 03/27/2031   Flu Shot  Completed   COVID-19 Vaccine  Completed   Hepatitis C Screening  Completed   HIV Screening  Completed   Zoster (Shingles) Vaccine  Completed   HPV Vaccine  Aged Out    Advanced directives: (Copy Requested) Please bring a copy of your health care power of attorney and living will to the office to be added to your chart at your convenience.  Next Medicare Annual Wellness Visit scheduled for next year: Yes, 12/14/24 @ 10:00am (video visit)

## 2024-01-02 ENCOUNTER — Other Ambulatory Visit: Payer: Self-pay | Admitting: Psychiatry

## 2024-01-02 ENCOUNTER — Other Ambulatory Visit: Payer: Self-pay | Admitting: Nurse Practitioner

## 2024-01-02 DIAGNOSIS — F79 Unspecified intellectual disabilities: Secondary | ICD-10-CM

## 2024-01-02 DIAGNOSIS — F250 Schizoaffective disorder, bipolar type: Secondary | ICD-10-CM

## 2024-01-02 DIAGNOSIS — G4700 Insomnia, unspecified: Secondary | ICD-10-CM

## 2024-01-02 NOTE — Telephone Encounter (Signed)
Requested Prescriptions  Pending Prescriptions Disp Refills   Multiple Vitamin (TAB-A-VITE) TABS [Pharmacy Med Name: Tab-A-Vite 400 mcg tablet] 30 tablet 12    Sig: TAKE ONE TABLET BY MOUTH EVERY DAY     There is no refill protocol information for this order     aspirin 81 MG chewable tablet [Pharmacy Med Name: aspirin 81 mg chewable tablet] 30 tablet 2    Sig: TAKE ONE TABLET BY MOUTH ONCE DAILY     Analgesics:  NSAIDS - aspirin Failed - 01/02/2024  5:48 PM      Failed - Cr in normal range and within 360 days    Creatinine  Date Value Ref Range Status  12/28/2013 1.36 (H) 0.60 - 1.30 mg/dL Final   Creatinine, Ser  Date Value Ref Range Status  01/02/2023 1.43 (H) 0.57 - 1.00 mg/dL Final         Failed - eGFR is 10 or above and within 360 days    EGFR (African American)  Date Value Ref Range Status  12/28/2013 52 (L)  Final   GFR calc Af Amer  Date Value Ref Range Status  09/13/2020 43 (L) >59 mL/min/1.73 Final    Comment:    **In accordance with recommendations from the NKF-ASN Task force,**   [facility] is in the process of updating its eGFR calculation to the   2021 CKD-EPI creatinine equation that estimates kidney function   without a race variable.    EGFR (Non-African Amer.)  Date Value Ref Range Status  12/28/2013 45 (L)  Final    Comment:    eGFR values <39mL/min/1.73 m2 may be an indication of chronic kidney disease (CKD). Calculated eGFR is useful in patients with stable renal function. The eGFR calculation will not be reliable in acutely ill patients when serum creatinine is changing rapidly. It is not useful in  patients on dialysis. The eGFR calculation may not be applicable to patients at the low and high extremes of body sizes, pregnant women, and vegetarians.    GFR calc non Af Amer  Date Value Ref Range Status  09/13/2020 38 (L) >59 mL/min/1.73 Final   eGFR  Date Value Ref Range Status  01/02/2023 42 (L) >59 mL/min/1.73 Final         Passed -  Patient is not pregnant      Passed - Valid encounter within last 12 months    Recent Outpatient Visits           6 months ago Schizoaffective disorder, bipolar type (HCC)   [place] [facility] [place], Corrie Dandy T, NP   1 year ago Stage 3b chronic kidney disease (HCC)   [place] [facility] [place], Corrie Dandy T, NP   1 year ago Stage 3b chronic kidney disease (HCC)   [place] [facility] [place], Corrie Dandy T, NP   2 years ago Schizoaffective disorder, bipolar type (HCC)   [place] [facility] [place], Corrie Dandy T, NP   2 years ago COVID-19   [place] [facility] Larae Grooms, NP       Future Appointments             In 3 days Cannady, Corrie Dandy T, NP [place] [facility]             magnesium oxide (MAG-OX) 400 (240 Mg) MG tablet [Pharmacy Med Name: magnesium oxide 400 mg (241.3 mg magnesium) tablet] 30 tablet 2    Sig: TAKE ONE TABLET BY  MOUTH EVERY DAY     Endocrinology:  Minerals - Magnesium Supplementation Failed - 01/02/2024  5:48 PM      Failed - Mg Level in normal range and within 360 days    Magnesium  Date Value Ref Range Status  01/02/2023 2.0 1.6 - 2.3 mg/dL Final  16/09/9603 1.7 (L) mg/dL Final    Comment:    5.4-0.9 THERAPEUTIC RANGE: 4-7 mg/dL TOXIC: > 10 mg/dL  -----------------------          Failed - Cr in normal range and within 360 days    Creatinine  Date Value Ref Range Status  12/28/2013 1.36 (H) 0.60 - 1.30 mg/dL Final   Creatinine, Ser  Date Value Ref Range Status  01/02/2023 1.43 (H) 0.57 - 1.00 mg/dL Final         Passed - Valid encounter within last 12 months    Recent Outpatient Visits           6 months ago Schizoaffective disorder, bipolar type (HCC)   [place] [facility] [place], Corrie Dandy T, NP   1 year ago Stage 3b chronic kidney disease (HCC)   [place] [facility] [place], Corrie Dandy T, NP   1 year ago  Stage 3b chronic kidney disease (HCC)   [place] [facility] [place], Corrie Dandy T, NP   2 years ago Schizoaffective disorder, bipolar type (HCC)   [place] [facility] Marjie Skiff, NP   2 years ago COVID-19   [place] [facility] Larae Grooms, NP       Future Appointments             In 3 days Cannady, Dorie Rank, NP  [facility]

## 2024-01-02 NOTE — Telephone Encounter (Signed)
Requested medications are due for refill today.  yes  Requested medications are on the active medications list.  yes  Last refill. 01/02/2023 #30 12 rf  Future visit scheduled.   yes  Notes to clinic.  No protocol fro refill.    Requested Prescriptions  Pending Prescriptions Disp Refills   Multiple Vitamin (TAB-A-VITE) TABS [Pharmacy Med Name: Tab-A-Vite 400 mcg tablet] 30 tablet 12    Sig: TAKE ONE TABLET BY MOUTH EVERY DAY     There is no refill protocol information for this order    Signed Prescriptions Disp Refills   aspirin 81 MG chewable tablet 30 tablet 0    Sig: TAKE ONE TABLET BY MOUTH ONCE DAILY     Analgesics:  NSAIDS - aspirin Failed - 01/02/2024  5:49 PM      Failed - Cr in normal range and within 360 days    Creatinine  Date Value Ref Range Status  12/28/2013 1.36 (H) 0.60 - 1.30 mg/dL Final   Creatinine, Ser  Date Value Ref Range Status  01/02/2023 1.43 (H) 0.57 - 1.00 mg/dL Final         Failed - eGFR is 10 or above and within 360 days    EGFR (African American)  Date Value Ref Range Status  12/28/2013 52 (L)  Final   GFR calc Af Amer  Date Value Ref Range Status  09/13/2020 43 (L) >59 mL/min/1.73 Final    Comment:    **In accordance with recommendations from the NKF-ASN Task force,**   [facility] is in the process of updating its eGFR calculation to the   2021 CKD-EPI creatinine equation that estimates kidney function   without a race variable.    EGFR (Non-African Amer.)  Date Value Ref Range Status  12/28/2013 45 (L)  Final    Comment:    eGFR values <24mL/min/1.73 m2 may be an indication of chronic kidney disease (CKD). Calculated eGFR is useful in patients with stable renal function. The eGFR calculation will not be reliable in acutely ill patients when serum creatinine is changing rapidly. It is not useful in  patients on dialysis. The eGFR calculation may not be applicable to patients at the low and high extremes of body sizes,  pregnant women, and vegetarians.    GFR calc non Af Amer  Date Value Ref Range Status  09/13/2020 38 (L) >59 mL/min/1.73 Final   eGFR  Date Value Ref Range Status  01/02/2023 42 (L) >59 mL/min/1.73 Final         Passed - Patient is not pregnant      Passed - Valid encounter within last 12 months    Recent Outpatient Visits           6 months ago Schizoaffective disorder, bipolar type (HCC)   [place] [facility] [place], Corrie Dandy T, NP   1 year ago Stage 3b chronic kidney disease (HCC)   [place] [facility] [place], Corrie Dandy T, NP   1 year ago Stage 3b chronic kidney disease (HCC)   [place] [facility] [place], Corrie Dandy T, NP   2 years ago Schizoaffective disorder, bipolar type (HCC)   [place] [facility] [place], Corrie Dandy T, NP   2 years ago COVID-19   [place] [facility] Larae Grooms, NP       Future Appointments             In 3 days Cannady, Dorie Rank, NP [facility],  [facility]             magnesium oxide (MAG-OX) 400 (240 Mg) MG tablet 30 tablet 0    Sig: TAKE ONE TABLET BY MOUTH EVERY DAY     Endocrinology:  Minerals - Magnesium Supplementation Failed - 01/02/2024  5:49 PM      Failed - Mg Level in normal range and within 360 days    Magnesium  Date Value Ref Range Status  01/02/2023 2.0 1.6 - 2.3 mg/dL Final  16/09/9603 1.7 (L) mg/dL Final    Comment:    5.4-0.9 THERAPEUTIC RANGE: 4-7 mg/dL TOXIC: > 10 mg/dL  -----------------------          Failed - Cr in normal range and within 360 days    Creatinine  Date Value Ref Range Status  12/28/2013 1.36 (H) 0.60 - 1.30 mg/dL Final   Creatinine, Ser  Date Value Ref Range Status  01/02/2023 1.43 (H) 0.57 - 1.00 mg/dL Final         Passed - Valid encounter within last 12 months    Recent Outpatient Visits           6 months ago Schizoaffective disorder, bipolar type (HCC)   [place] [facility] [place], Corrie Dandy T, NP   1 year ago Stage 3b chronic kidney disease (HCC)   [place] [facility] [place], Corrie Dandy T, NP   1 year ago Stage 3b chronic kidney disease (HCC)   [place] [facility] [place], Corrie Dandy T, NP   2 years ago Schizoaffective disorder, bipolar type (HCC)   [place] [facility] Marjie Skiff, NP   2 years ago COVID-19    [facility] Larae Grooms, NP       Future Appointments             In 3 days Cannady, Dorie Rank, NP  [facility], [facility]

## 2024-01-03 NOTE — Patient Instructions (Signed)

## 2024-01-04 ENCOUNTER — Encounter: Payer: Self-pay | Admitting: Nurse Practitioner

## 2024-01-05 ENCOUNTER — Other Ambulatory Visit: Payer: Self-pay | Admitting: Nurse Practitioner

## 2024-01-05 ENCOUNTER — Encounter: Payer: Self-pay | Admitting: Nurse Practitioner

## 2024-01-05 ENCOUNTER — Ambulatory Visit (INDEPENDENT_AMBULATORY_CARE_PROVIDER_SITE_OTHER): Payer: 59 | Admitting: Nurse Practitioner

## 2024-01-05 VITALS — BP 121/77 | HR 73 | Temp 98.1°F | Ht 59.8 in | Wt 230.4 lb

## 2024-01-05 DIAGNOSIS — N1832 Chronic kidney disease, stage 3b: Secondary | ICD-10-CM | POA: Diagnosis not present

## 2024-01-05 DIAGNOSIS — I10 Essential (primary) hypertension: Secondary | ICD-10-CM

## 2024-01-05 DIAGNOSIS — F250 Schizoaffective disorder, bipolar type: Secondary | ICD-10-CM | POA: Diagnosis not present

## 2024-01-05 DIAGNOSIS — Z0000 Encounter for general adult medical examination without abnormal findings: Secondary | ICD-10-CM | POA: Diagnosis not present

## 2024-01-05 DIAGNOSIS — E559 Vitamin D deficiency, unspecified: Secondary | ICD-10-CM

## 2024-01-05 DIAGNOSIS — E210 Primary hyperparathyroidism: Secondary | ICD-10-CM

## 2024-01-05 DIAGNOSIS — K5909 Other constipation: Secondary | ICD-10-CM

## 2024-01-05 DIAGNOSIS — K219 Gastro-esophageal reflux disease without esophagitis: Secondary | ICD-10-CM

## 2024-01-05 DIAGNOSIS — G43009 Migraine without aura, not intractable, without status migrainosus: Secondary | ICD-10-CM

## 2024-01-05 DIAGNOSIS — E782 Mixed hyperlipidemia: Secondary | ICD-10-CM

## 2024-01-05 DIAGNOSIS — R7301 Impaired fasting glucose: Secondary | ICD-10-CM

## 2024-01-05 LAB — MICROALBUMIN, URINE WAIVED
Creatinine, Urine Waived: 300 mg/dL (ref 10–300)
Microalb, Ur Waived: 80 mg/L — ABNORMAL HIGH (ref 0–19)
Microalb/Creat Ratio: <30 mg/g (ref <30)

## 2024-01-05 LAB — BAYER DCA HB A1C WAIVED: HB A1C (BAYER DCA - WAIVED): 5.8 % — ABNORMAL HIGH (ref 4.8–5.6)

## 2024-01-05 MED ORDER — DOCUSATE SODIUM 100 MG PO CAPS: ORAL_CAPSULE | ORAL | 4 refills | Status: AC

## 2024-01-05 MED ORDER — ROSUVASTATIN CALCIUM 40 MG PO TABS: 40.0000 mg | ORAL_TABLET | Freq: Every day | ORAL | 4 refills | Status: DC

## 2024-01-05 MED ORDER — OMEPRAZOLE 20 MG PO CPDR: DELAYED_RELEASE_CAPSULE | ORAL | 4 refills | Status: DC

## 2024-01-05 MED ORDER — ICOSAPENT ETHYL 1 G PO CAPS: 2.0000 g | ORAL_CAPSULE | Freq: Two times a day (BID) | ORAL | 4 refills | Status: DC

## 2024-01-05 MED ORDER — MAGNESIUM OXIDE -MG SUPPLEMENT 400 (240 MG) MG PO TABS: 1.0000 | ORAL_TABLET | Freq: Every day | ORAL | 4 refills | Status: DC

## 2024-01-05 MED ORDER — SUMATRIPTAN SUCCINATE 100 MG PO TABS: 100.0000 mg | ORAL_TABLET | ORAL | 0 refills | Status: AC | PRN

## 2024-01-05 MED ORDER — ASPIRIN 81 MG PO CHEW: 81.0000 mg | CHEWABLE_TABLET | Freq: Every day | ORAL | 4 refills | Status: DC

## 2024-01-05 MED ORDER — LISINOPRIL 5 MG PO TABS: 5.0000 mg | ORAL_TABLET | Freq: Every day | ORAL | 4 refills | Status: DC

## 2024-01-05 NOTE — Assessment & Plan Note (Signed)
BMI 45.30 with HTN, CKD.  Recommended eating smaller high protein, low fat meals more frequently and exercising 30 mins a day 5 times a week with a goal of 10-15lb weight loss in the next 3 months. Patient voiced their understanding and motivation to adhere to these recommendations.

## 2024-01-05 NOTE — Assessment & Plan Note (Signed)
Chronic, ongoing, refills sent in on Colace. 

## 2024-01-05 NOTE — Assessment & Plan Note (Signed)
Ongoing, continue supplement and check level today. 

## 2024-01-05 NOTE — Assessment & Plan Note (Signed)
Chronic, stable, followed by neurology. Continue this collaboration.  Recent notes reviewed -- continue medication as ordered by them. 

## 2024-01-05 NOTE — Assessment & Plan Note (Signed)
Ongoing per nephrology notes.  Continue collaboration with nephrology, recent note reviewed.  Labs obtained today.

## 2024-01-05 NOTE — Assessment & Plan Note (Signed)
Chronic, stable, followed by psychiatry.  Continue current medication regimen as prescribed by them, recent note reviewed.  Denies SI/HI.  Obtain labs or EKG for psychiatry when needed.

## 2024-01-05 NOTE — Assessment & Plan Note (Addendum)
Chronic, stable.  Continue collaboration with nephrology and Lisinopril for kidney protection, recent notes reviewed.  Labs: CMP, CBC, urine ALB.  Urine ALB 80 February 2025.

## 2024-01-05 NOTE — Assessment & Plan Note (Signed)
Chronic, stable. BP at goal in office today.  Continue current medication regimen and collaboration with nephrology.  Lisinopril for kidney protection, refills sent.  Recommend checking BP at home on occasion and documenting.  Labs today: A1c, CBC, CMP, urine ALB.   Return in 6 months.

## 2024-01-05 NOTE — Assessment & Plan Note (Signed)
Chronic, ongoing.  Stable with Omeprazole, has tried reductions without success.  Continue current medication regimen and adjust as needed.  Mag level today. Risks of PPI use were discussed with patient including bone loss, C. Diff diarrhea, pneumonia, infections, CKD, electrolyte abnormalities.  Verbalizes understanding and chooses to continue the medication.

## 2024-01-05 NOTE — Progress Notes (Signed)
BP 121/77   Pulse 73   Temp 98.1 F (36.7 C) (Oral)   Ht 4' 11.8" (1.519 m)   Wt 230 lb 6.4 oz (104.5 kg)   LMP  (LMP Unknown)   SpO2 96%   BMI 45.30 kg/m    Subjective:    Patient ID: Rita [name], female    DOB: Jul 25, 1961, 63 y.o.   MRN: [id number]  HPI: Rita [name] is a 63 y.o. female presenting on 01/05/2024 for comprehensive medical examination. Current medical complaints include:none  She currently lives with: group home  Menopausal Symptoms: no  HYPERTENSION / HYPERLIPIDEMIA WITH CKD 3b Follows with nephrology, Dr. Cherylann Ratel, with last visit on 10/02/23.  Labs at time eGFR 35, CRT 1.64, PTH 73.  Continues on Crestor, Lisinopril and Carvedilol + ASA. Saw neurology for migraine follow-up on 11/12/22. Continues on Imitrex.  Has been drinking more water out of [employer]. Satisfied with current treatment? yes Duration of hypertension: chronic BP monitoring frequency: not checking BP range:  BP medication side effects: no Duration of hyperlipidemia: chronic Cholesterol medication side effects: no Cholesterol supplements: none Medication compliance: good compliance Aspirin: yes Recent stressors: no Recurrent headaches: no Visual changes: no Palpitations: no Dyspnea: no Chest pain: no Lower extremity edema: no Dizzy/lightheaded: no   GERD Taking Omeprazole  daily. GERD control status: stable Satisfied with current treatment? yes Heartburn frequency: none Medication side effects: no  Medication compliance: stable Previous GERD medications: Dysphagia: no Odynophagia:  no Hematemesis: no Blood in stool: no EGD: no    Impaired Fasting Glucose HbA1C:  Lab Results  Component Value Date   HGBA1C 6.0 (H) 07/02/2023  Duration of elevated blood sugar:  Polydipsia: no Polyuria: no Weight change: no Visual disturbance: no Glucose Monitoring: no    Accucheck frequency: TID    Fasting glucose:     Post prandial:  Diabetic Education: Not Completed Family  history of diabetes: no    SCHIZOAFFECTIVE DISORDER Psychiatry last saw on 09/29/23, Dr. Elna Breslow.  Has learning disabilities and lives in stable group home.  Does attend therapy once a month, Tina. Mood status: stable Satisfied with current treatment?: yes Symptom severity: mild  Duration of current treatment : chronic Side effects: no Medication compliance: good compliance Psychotherapy/counseling: none Depressed mood: no Anxious mood: no Anhedonia: no Significant weight loss or gain: no Insomnia: no Fatigue: no Feelings of worthlessness or guilt: no Impaired concentration/indecisiveness: no Suicidal ideations: no Hopelessness: no Crying spells: no    01/05/2024    9:02 AM 12/09/2023    9:35 AM 09/29/2023    9:23 AM 07/02/2023    8:45 AM 06/30/2023    9:52 AM  Depression screen PHQ 2/9  Decreased Interest 0 0  0   Down, Depressed, Hopeless 0 0  0   PHQ - 2 Score 0 0  0   Altered sleeping 0   0   Tired, decreased energy 0   0   Change in appetite 0   0   Feeling bad or failure about yourself  0   0   Trouble concentrating 0   0   Moving slowly or fidgety/restless 0   0   Suicidal thoughts 0   0   PHQ-9 Score 0   0   Difficult doing work/chores Not difficult at all   Not difficult at all      Information is confidential and restricted. Go to Review Flowsheets to unlock data.       01/05/2024  9:02 AM 09/29/2023    9:23 AM 07/02/2023    8:45 AM 06/30/2023    9:52 AM  GAD 7 : Generalized Anxiety Score  Nervous, Anxious, on Edge 0  0   Control/stop worrying 0  0   Worry too much - different things 1  0   Trouble relaxing 0  0   Restless 0  0   Easily annoyed or irritable 0  0   Afraid - awful might happen 0  0   Total GAD 7 Score 1  0   Anxiety Difficulty Not difficult at all  Not difficult at all      Information is confidential and restricted. Go to Review Flowsheets to unlock data.      11/12/2022    8:38 AM 01/02/2023    9:59 AM 07/02/2023    8:45 AM 12/09/2023     9:39 AM 01/05/2024    9:02 AM  Fall Risk  Falls in the past year? 0 0 0 0 0  Was there an injury with Fall? 0 0 0 0 0  Fall Risk Category Calculator 0 0 0 0 0  Fall Risk Category (Retired) Low      (RETIRED) Patient Fall Risk Level Low fall risk      Patient at Risk for Falls Due to  No Fall Risks No Fall Risks No Fall Risks No Fall Risks  Fall risk Follow up Falls evaluation completed;Education provided;Falls prevention discussed Falls prevention discussed Falls evaluation completed Falls prevention discussed Falls evaluation completed    Functional Status Survey:     Past Medical History:  Past Medical History:  Diagnosis Date   Adenomatous polyps    Allergy    Should not take Topamax or Ibuprofen because of  chronic kidney disease   Anxiety    Cataract    Cataract on one eye, not needing surgery yet   Chronic constipation    Chronic tension headaches    CKD (chronic kidney disease) stage 3, GFR 30-59 ml/min (HCC)    Depression    Factitious disorder    GERD (gastroesophageal reflux disease)    Hyperlipidemia    Hypertension    Intellectual disability due to developmental disorder, unspecified    Mental retardation    Microscopic hematuria    Migraine    Obesity     Surgical History:  Past Surgical History:  Procedure Laterality Date   CARPAL TUNNEL RELEASE     COLONOSCOPY WITH PROPOFOL N/A 06/27/2017   Procedure: COLONOSCOPY WITH PROPOFOL;  Surgeon: Scot Jun, MD;  Location: [facility] ENDOSCOPY;  Service: Endoscopy;  Laterality: N/A;   COLONOSCOPY WITH PROPOFOL N/A 07/23/2022   Procedure: COLONOSCOPY WITH PROPOFOL;  Surgeon: Wyline Mood, MD;  Location: [facility] ENDOSCOPY;  Service: Gastroenterology;  Laterality: N/A;   CYSTOSCOPY W/ RETROGRADES Bilateral 11/15/2015   Procedure: CYSTOSCOPY WITH RETROGRADE PYELOGRAM;  Surgeon: Vanna Scotland, MD;  Location: [facility] ORS;  Service: Urology;  Laterality: Bilateral;   US ECHOCARDIOGRAPHY  2012   showed mild heart failure  (LVEF 52%); small pericardial effusion    Medications:  Current Outpatient Medications on File Prior to Visit  Medication Sig   Acetaminophen Extra Strength 500 MG TABS TAKE (2) TABLETS BY MOUTH EVERY SIX HOURS AS NEEDED.   carvedilol (COREG) 6.25 MG tablet Take 6.25 mg by mouth 2 (two) times daily.   cholecalciferol (VITAMIN D3) 25 MCG (1000 UNIT) tablet TAKE (2) TABLETS BY MOUTH ONCE DAILY.   clonazePAM (KLONOPIN) 0.5 MG tablet Take 1  tablet (0.5 mg total) by mouth 2 (two) times daily.   diclofenac Sodium (VOLTAREN) 1 % GEL Apply 2 g topically 3 (three) times daily.   DULoxetine (CYMBALTA) 30 MG capsule Take 1 capsule (30 mg total) by mouth at bedtime. Take along with 60 mg daily , total of 90 mg   DULoxetine (CYMBALTA) 60 MG capsule TAKE (1) CAPSULE BY MOUTH ONCE DAILY.TAKE ALONG WITH 30 MG, TOTAL OF 90 MG DAILY   GNP COUGH DM ER 30 MG/5ML liquid GIVE 2.5MLS BY MOUTH AS NEEDED FOR COUGH.   Multiple Vitamin (TAB-A-VITE) TABS TAKE ONE TABLET BY MOUTH EVERY DAY   QUEtiapine (SEROQUEL) 100 MG tablet Take 1 tablet (100 mg total) by mouth at bedtime.   sodium fluoride (FLUORISHIELD) 1.1 % GEL dental gel Take by mouth.   traZODone (DESYREL) 150 MG tablet TAKE (1) TABLET BY MOUTH DAILY AT BEDTIME.   No current facility-administered medications on file prior to visit.    Allergies:  Allergies  Allergen Reactions   Advil [Ibuprofen] Other (See Comments)    Reaction:  Gives her kidney problems     Social History:  Social History   Socioeconomic History   Marital status: Single    Spouse name: Not on file   Number of children: Not on file   Years of education: Not on file   Highest education level: 12th grade  Occupational History   Occupation: Consulting civil engineer   Occupation: disability  Tobacco Use   Smoking status: Never   Smokeless tobacco: Never  Vaping Use   Vaping status: Never Used  Substance and Sexual Activity   Alcohol use: No   Drug use: No   Sexual activity: Never  Other  Topics Concern   Not on file  Social History Narrative   Goes to [employer]   Social Drivers of Health   Financial Resource Strain: Low Risk  (01/01/2024)   Overall Financial Resource Strain (CARDIA)    Difficulty of Paying Living Expenses: Not hard at all  Food Insecurity: No Food Insecurity (01/01/2024)   Hunger Vital Sign    Worried About Running Out of Food in the Last Year: Never true    Ran Out of Food in the Last Year: Never true  Transportation Needs: No Transportation Needs (01/01/2024)   PRAPARE - Administrator, Civil Service (Medical): No    Lack of Transportation (Non-Medical): No  Physical Activity: Insufficiently Active (01/01/2024)   Exercise Vital Sign    Days of Exercise per Week: 2 days    Minutes of Exercise per Session: 10 min  Stress: No Stress Concern Present (01/01/2024)   [employer] of Occupational Health - Occupational Stress Questionnaire    Feeling of Stress : Only a little  Social Connections: Moderately Isolated (01/01/2024)   Social Connection and Isolation Panel [NHANES]    Frequency of Communication with Friends and Family: More than three times a week    Frequency of Social Gatherings with Friends and Family: More than three times a week    Attends Religious Services: More than 4 times per year    Active Member of [employer] or Organizations: No    Attends Banker Meetings: Never    Marital Status: Never married  Intimate Partner Violence: Not At Risk (12/09/2023)   Humiliation, Afraid, Rape, and Kick questionnaire    Fear of Current or Ex-Partner: No    Emotionally Abused: No    Physically Abused: No    Sexually Abused: No  Social History   Tobacco Use  Smoking Status Never  Smokeless Tobacco Never   Social History   Substance and Sexual Activity  Alcohol Use No    Family History:  Family History  Problem Relation Age of Onset   COPD Mother    Other Mother 57       "old age"   Dementia Mother    Heart disease  Father    Hyperlipidemia Father    Kidney disease Father    Cancer Father    Hypertension Father    Obesity Father    Hyperlipidemia Sister    Hypertension Sister    Kidney disease Sister    Breast cancer Maternal Aunt    Stroke Maternal Grandmother    Heart disease Maternal Grandfather    Cancer Paternal Grandmother    Hyperlipidemia Paternal Grandmother    Hypertension Paternal Grandmother    Obesity Paternal Grandmother    Vision loss Paternal Grandmother    Alcohol abuse Paternal Grandfather    Intellectual disability Maternal Aunt    Learning disabilities Maternal Aunt    Prostate cancer Neg Hx     Past medical history, surgical history, medications, allergies, family history and social history reviewed with patient today and changes made to appropriate areas of the chart.   ROS All other ROS negative except what is listed above and in the HPI.      Objective:    BP 121/77   Pulse 73   Temp 98.1 F (36.7 C) (Oral)   Ht 4' 11.8" (1.519 m)   Wt 230 lb 6.4 oz (104.5 kg)   LMP  (LMP Unknown)   SpO2 96%   BMI 45.30 kg/m   Wt Readings from Last 3 Encounters:  01/05/24 230 lb 6.4 oz (104.5 kg)  12/09/23 230 lb (104.3 kg)  07/02/23 228 lb 3.2 oz (103.5 kg)    Physical Exam Vitals and nursing note reviewed. Exam conducted with a chaperone present.  Constitutional:      General: She is awake. She is not in acute distress.    Appearance: She is well-developed. She is not ill-appearing.  HENT:     Head: Normocephalic and atraumatic.     Right Ear: Hearing, tympanic membrane, ear canal and external ear normal. No drainage.     Left Ear: Hearing, tympanic membrane, ear canal and external ear normal. No drainage.     Nose: Nose normal.     Right Sinus: No maxillary sinus tenderness or frontal sinus tenderness.     Left Sinus: No maxillary sinus tenderness or frontal sinus tenderness.     Mouth/Throat:     Mouth: Mucous membranes are moist.     Pharynx: Oropharynx is  clear. Uvula midline. No pharyngeal swelling, oropharyngeal exudate or posterior oropharyngeal erythema.  Eyes:     General: Lids are normal.        Right eye: No discharge.        Left eye: No discharge.     Extraocular Movements: Extraocular movements intact.     Conjunctiva/sclera: Conjunctivae normal.     Pupils: Pupils are equal, round, and reactive to light.     Visual Fields: Right eye visual fields normal and left eye visual fields normal.  Neck:     Thyroid: No thyromegaly.     Vascular: No carotid bruit.     Trachea: Trachea normal.  Cardiovascular:     Rate and Rhythm: Normal rate and regular rhythm.     Heart sounds:  Normal heart sounds. No murmur heard.    No gallop.  Pulmonary:     Effort: Pulmonary effort is normal. No accessory muscle usage or respiratory distress.     Breath sounds: Normal breath sounds.  Chest:     Comments: Deferred per patient request. Abdominal:     General: Bowel sounds are normal.     Palpations: Abdomen is soft. There is no hepatomegaly or splenomegaly.     Tenderness: There is no abdominal tenderness.  Musculoskeletal:        General: Normal range of motion.     Cervical back: Normal range of motion and neck supple.     Right lower leg: No edema.     Left lower leg: No edema.  Lymphadenopathy:     Head:     Right side of head: No submental, submandibular, tonsillar, preauricular or posterior auricular adenopathy.     Left side of head: No submental, submandibular, tonsillar, preauricular or posterior auricular adenopathy.     Cervical: No cervical adenopathy.  Skin:    General: Skin is warm and dry.     Capillary Refill: Capillary refill takes less than 2 seconds.     Findings: Lesion present. No rash.     Comments: Two approx 2 cm round lesions to back.  Brown in color with scaly texture.  Stuck on appearance.  Neurological:     Mental Status: She is alert and oriented to person, place, and time.     Gait: Gait is intact.     Deep  Tendon Reflexes: Reflexes are normal and symmetric.     Reflex Scores:      Brachioradialis reflexes are 2+ on the right side and 2+ on the left side.      Patellar reflexes are 2+ on the right side and 2+ on the left side. Psychiatric:        Attention and Perception: Attention normal.        Mood and Affect: Mood normal.        Speech: Speech normal.        Behavior: Behavior normal. Behavior is cooperative.        Thought Content: Thought content normal.        Judgment: Judgment normal.    Results for orders placed or performed in visit on 07/02/23  Drugs of abuse scrn w alc, routine urine   Collection Time: 07/02/23 10:01 AM  Result Value Ref Range   Amphetamines, Urine Negative Cutoff=1000 ng/mL   Barbiturate Quant, Ur Negative Cutoff=300 ng/mL   Benzodiazepine Quant, Ur Negative Cutoff=300 ng/mL   Cannabinoid Quant, Ur Negative Cutoff=50 ng/mL   Cocaine (Metab.) Negative Cutoff=300 ng/mL   Opiate Quant, Ur Negative Cutoff=300 ng/mL   PCP Quant, Ur Negative Cutoff=25 ng/mL   Methadone Screen, Urine Negative Cutoff=300 ng/mL   Propoxyphene Negative Cutoff=300 ng/mL   Ethanol, Urine Negative Cutoff=0.020 %  Bayer DCA Hb A1c Waived   Collection Time: 07/02/23 10:02 AM  Result Value Ref Range   HB A1C (BAYER DCA - WAIVED) 6.0 (H) 4.8 - 5.6 %  Lipid Panel w/o Chol/HDL Ratio   Collection Time: 07/02/23 10:03 AM  Result Value Ref Range   Cholesterol, Total 166 100 - 199 mg/dL   Triglycerides 811 (H) 0 - 149 mg/dL   HDL 55 >91 mg/dL   VLDL Cholesterol Cal 39 5 - 40 mg/dL   LDL Chol Calc (NIH) 72 0 - 99 mg/dL  Prolactin   Collection Time: 07/02/23 10:03 AM  Result Value Ref Range   Prolactin 12.0 3.6 - 25.2 ng/mL      Assessment & Plan:   Problem List Items Addressed This Visit       Cardiovascular and Mediastinum   Essential hypertension   Chronic, stable. BP at goal in office today.  Continue current medication regimen and collaboration with nephrology.  Lisinopril  for kidney protection, refills sent.  Recommend checking BP at home on occasion and documenting.  Labs today: A1c, CBC, CMP, urine ALB.   Return in 6 months.        Relevant Medications   aspirin 81 MG chewable tablet   icosapent Ethyl (VASCEPA) 1 g capsule   lisinopril (ZESTRIL) 5 MG tablet   rosuvastatin (CRESTOR) 40 MG tablet   Other Relevant Orders   Microalbumin, Urine Waived   CBC with Differential/Platelet   TSH   Headache, migraine   Chronic, stable, followed by neurology. Continue this collaboration.  Recent notes reviewed -- continue medication as ordered by them.      Relevant Medications   aspirin 81 MG chewable tablet   icosapent Ethyl (VASCEPA) 1 g capsule   lisinopril (ZESTRIL) 5 MG tablet   rosuvastatin (CRESTOR) 40 MG tablet   SUMAtriptan (IMITREX) 100 MG tablet     Digestive   Chronic constipation   Chronic, ongoing, refills sent in on [facility].      Relevant Medications   docusate sodium (COLACE) 100 MG capsule   GERD (gastroesophageal reflux disease)   Chronic, ongoing.  Stable with Omeprazole, has tried reductions without success.  Continue current medication regimen and adjust as needed.  Mag level today. Risks of PPI use were discussed with patient including bone loss, C. Diff diarrhea, pneumonia, infections, CKD, electrolyte abnormalities.  Verbalizes understanding and chooses to continue the medication.       Relevant Medications   docusate sodium (COLACE) 100 MG capsule   omeprazole (PRILOSEC) 20 MG capsule   Other Relevant Orders   Magnesium     Endocrine   IFG (impaired fasting glucose)   Recommend continued diet focus, cutting back on soda and drinking more water + weight loss.  A1c trend down to 5.8% today.  No current medications, start as needed.  Urine ALB 80 February 2025.      Relevant Orders   Bayer DCA Hb A1c Waived   Microalbumin, Urine Waived   Primary hyperparathyroidism (HCC)   Ongoing per nephrology notes.  Continue  collaboration with nephrology, recent note reviewed.  Labs obtained today.        Genitourinary   Chronic kidney disease, stage 3 (HCC)   Chronic, stable.  Continue collaboration with nephrology and Lisinopril for kidney protection, recent notes reviewed.  Labs: CMP, CBC, urine ALB.  Urine ALB 80 February 2025.      Relevant Orders   Microalbumin, Urine Waived   Comprehensive metabolic panel     Other   Hyperlipidemia   Chronic, ongoing.  Continue current medication regimen and adjust as needed.  Lipid panel today.      Relevant Medications   aspirin 81 MG chewable tablet   icosapent Ethyl (VASCEPA) 1 g capsule   lisinopril (ZESTRIL) 5 MG tablet   rosuvastatin (CRESTOR) 40 MG tablet   Other Relevant Orders   Comprehensive metabolic panel   Lipid Panel w/o Chol/HDL Ratio   Morbid obesity (HCC)   BMI 45.30 with HTN, CKD.  Recommended eating smaller high protein, low fat meals more frequently and exercising 30 mins a day  5 times a week with a goal of 10-15lb weight loss in the next 3 months. Patient voiced their understanding and motivation to adhere to these recommendations.       Schizoaffective disorder, bipolar type (HCC) - Primary   Chronic, stable, followed by psychiatry.  Continue current medication regimen as prescribed by them, recent note reviewed.  Denies SI/HI.  Obtain labs or EKG for psychiatry when needed.      Vitamin D deficiency   Ongoing, continue supplement and check level today.      Relevant Orders   VITAMIN D 25 Hydroxy (Vit-D Deficiency, Fractures)   Other Visit Diagnoses       Encounter for annual physical exam       Annual physical today with labs and health maintenance reviewed, discussed with patient.        Follow up plan: Return in about 6 months (around 07/04/2024) for HTN/HLD, CKD, MOOD, IFG.   LABORATORY TESTING:  - Pap smear: not applicable  IMMUNIZATIONS:   - Tetanus vaccination status reviewed: last tetanus booster within 10  years. - Influenza: Up to date - Pneumovax: Not applicable - Prevnar: Not applicable - COVID: Up to date - HPV: Not applicable - Shingrix vaccine: Up to date  SCREENING: -Mammogram: Up to date last had 10/13/23 - Colonoscopy: Up to date 07/23/2029 next - Bone Density: Not applicable  -Hearing Test: Not applicable  -Spirometry: Not applicable   PATIENT COUNSELING:   Advised to take 1 mg of folate supplement per day if capable of pregnancy.   Sexuality: Discussed sexually transmitted diseases, partner selection, use of condoms, avoidance of unintended pregnancy  and contraceptive alternatives.   Advised to avoid cigarette smoking.  I discussed with the patient that most people either abstain from alcohol or drink within safe limits (<=14/week and <=4 drinks/occasion for males, <=7/weeks and <= 3 drinks/occasion for females) and that the risk for alcohol disorders and other health effects rises proportionally with the number of drinks per week and how often a drinker exceeds daily limits.  Discussed cessation/primary prevention of drug use and availability of treatment for abuse.   Diet: Encouraged to adjust caloric intake to maintain  or achieve ideal body weight, to reduce intake of dietary saturated fat and total fat, to limit sodium intake by avoiding high sodium foods and not adding table salt, and to maintain adequate dietary potassium and calcium preferably from fresh fruits, vegetables, and low-fat dairy products.    Stressed the importance of regular exercise  Injury prevention: Discussed safety belts, safety helmets, smoke detector, smoking near bedding or upholstery.   Dental health: Discussed importance of regular tooth brushing, flossing, and dental visits.    NEXT PREVENTATIVE PHYSICAL DUE IN 1 YEAR. Return in about 6 months (around 07/04/2024) for HTN/HLD, CKD, MOOD, IFG.

## 2024-01-05 NOTE — Assessment & Plan Note (Signed)
 Chronic, ongoing.  Continue current medication regimen and adjust as needed. Lipid panel today.

## 2024-01-05 NOTE — Assessment & Plan Note (Signed)
Recommend continued diet focus, cutting back on soda and drinking more water + weight loss.  A1c trend down to 5.8% today.  No current medications, start as needed.  Urine ALB 80 February 2025.

## 2024-01-06 ENCOUNTER — Encounter: Payer: Self-pay | Admitting: Nurse Practitioner

## 2024-01-06 LAB — CBC WITH DIFFERENTIAL/PLATELET
Basophils Absolute: 0 10*3/uL (ref 0.0–0.2)
Basos: 1 %
EOS (ABSOLUTE): 0.3 10*3/uL (ref 0.0–0.4)
Eos: 4 %
Hematocrit: 39 % (ref 34.0–46.6)
Hemoglobin: 12.3 g/dL (ref 11.1–15.9)
Immature Grans (Abs): 0 10*3/uL (ref 0.0–0.1)
Immature Granulocytes: 0 %
Lymphocytes Absolute: 2.4 10*3/uL (ref 0.7–3.1)
Lymphs: 29 %
MCH: 28.7 pg (ref 26.6–33.0)
MCHC: 31.5 g/dL (ref 31.5–35.7)
MCV: 91 fL (ref 79–97)
Monocytes Absolute: 0.4 10*3/uL (ref 0.1–0.9)
Monocytes: 5 %
Neutrophils Absolute: 5.2 10*3/uL (ref 1.4–7.0)
Neutrophils: 61 %
Platelets: 244 10*3/uL (ref 150–450)
RBC: 4.29 x10E6/uL (ref 3.77–5.28)
RDW: 13.8 % (ref 11.7–15.4)
WBC: 8.4 10*3/uL (ref 3.4–10.8)

## 2024-01-06 LAB — LIPID PANEL W/O CHOL/HDL RATIO
Cholesterol, Total: 147 mg/dL (ref 100–199)
HDL: 50 mg/dL (ref >39)
LDL Chol Calc (NIH): 64 mg/dL (ref 0–99)
Triglycerides: 204 mg/dL — ABNORMAL HIGH (ref 0–149)
VLDL Cholesterol Cal: 33 mg/dL (ref 5–40)

## 2024-01-06 LAB — VITAMIN D 25 HYDROXY (VIT D DEFICIENCY, FRACTURES): Vit D, 25-Hydroxy: 34.8 ng/mL (ref 30.0–100.0)

## 2024-01-06 LAB — COMPREHENSIVE METABOLIC PANEL
ALT: 23 [IU]/L (ref 0–32)
AST: 24 [IU]/L (ref 0–40)
Albumin: 4 g/dL (ref 3.9–4.9)
Alkaline Phosphatase: 118 [IU]/L (ref 44–121)
BUN/Creatinine Ratio: 9 — ABNORMAL LOW (ref 12–28)
BUN: 14 mg/dL (ref 8–27)
Bilirubin Total: <0.2 mg/dL (ref 0.0–1.2)
CO2: 22 mmol/L (ref 20–29)
Calcium: 9.2 mg/dL (ref 8.7–10.3)
Chloride: 104 mmol/L (ref 96–106)
Creatinine, Ser: 1.56 mg/dL — ABNORMAL HIGH (ref 0.57–1.00)
Globulin, Total: 2.7 g/dL (ref 1.5–4.5)
Glucose: 93 mg/dL (ref 70–99)
Potassium: 4.6 mmol/L (ref 3.5–5.2)
Sodium: 140 mmol/L (ref 134–144)
Total Protein: 6.7 g/dL (ref 6.0–8.5)
eGFR: 37 mL/min/{1.73_m2} — ABNORMAL LOW (ref >59)

## 2024-01-06 LAB — MAGNESIUM: Magnesium: 2 mg/dL (ref 1.6–2.3)

## 2024-01-06 LAB — TSH: TSH: 1.95 u[IU]/mL (ref 0.450–4.500)

## 2024-01-06 NOTE — Telephone Encounter (Signed)
 Requested Prescriptions  Pending Prescriptions Disp Refills   Cholecalciferol  (GNP VITAMIN D3 EXTRA STRENGTH) 25 MCG (1000 UT) tablet [Pharmacy Med Name: cholecalciferol  (vitamin D3) 25 mcg (1,000 unit) tablet] 180 tablet 0    Sig: TAKE TWO TABLETS BY MOUTH ONCE DAILY     Endocrinology:  Vitamins - Vitamin D  Supplementation 2 Failed - 01/06/2024  4:11 PM      Failed - Manual Review: Route requests for 50,000 IU strength to the provider      Passed - Ca in normal range and within 360 days    Calcium   Date Value Ref Range Status  01/05/2024 9.2 8.7 - 10.3 mg/dL Final   Calcium , Total  Date Value Ref Range Status  12/28/2013 8.6 8.5 - 10.1 mg/dL Final         Passed - Vitamin D  in normal range and within 360 days    Vit D, 25-Hydroxy  Date Value Ref Range Status  01/05/2024 34.8 30.0 - 100.0 ng/mL Final    Comment:    Vitamin D  deficiency has been defined by the Institute of Medicine and an Endocrine Society practice guideline as a level of serum 25-OH vitamin D  less than 20 ng/mL (1,2). The Endocrine Society went on to further define vitamin D  insufficiency as a level between 21 and 29 ng/mL (2). 1. IOM (Institute of Medicine). 2010. Dietary reference    intakes for calcium  and D. [place]: The    [employer]. 2. Holick MF, Binkley [place], Bischoff-Ferrari HA, et al.    Evaluation, treatment, and prevention of vitamin D     deficiency: an Endocrine Society clinical practice    guideline. JCEM. 2011 Jul; 96(7):1911-30.          Passed - Valid encounter within last 12 months    Recent Outpatient Visits           6 months ago Schizoaffective disorder, bipolar type (HCC)   [place] [facility] Ashk[place], Melanie T, NP   1 year ago Stage 3b chronic kidney disease (HCC)   [place] [facility] Sully, Jolene T, NP   1 year ago Stage 3b chronic kidney disease (HCC)   [place] [facility] [place], Jolene T, NP   2 years  ago Schizoaffective disorder, bipolar type [facility])   [place] [facility] Valerio Melanie T, NP   2 years ago COVID-19   [place] [facility] Melvin Pao, NP       Future Appointments             In 6 months Cannady, Jolene T, NP [place] [facility]

## 2024-01-06 NOTE — Progress Notes (Signed)
Contacted via MyChart   Good morning Rita [name], your labs have returned: - Kidney function, eGFR and creatinine, continues to show chronic kidney disease Stage 3b.  Ensure plenty of water intake daily and avoid Ibuprofen products.  Liver function, AST and ALT, are normal. - Remainder of labs are stable, with exception of mild elevation in triglycerides.  Continue Rosuvastatin as ordered and focus on healthy diet changes.  Any questions? Keep being stellar!!  Thank you for allowing me to participate in your care.  I appreciate you. Kindest regards, Ridgely Anastacio

## 2024-01-26 ENCOUNTER — Ambulatory Visit (INDEPENDENT_AMBULATORY_CARE_PROVIDER_SITE_OTHER): Payer: 59 | Admitting: Psychiatry

## 2024-01-26 ENCOUNTER — Encounter: Payer: Self-pay | Admitting: Psychiatry

## 2024-01-26 VITALS — BP 118/80 | HR 96 | Temp 97.8°F | Ht 59.8 in | Wt 227.8 lb

## 2024-01-26 DIAGNOSIS — G4709 Other insomnia: Secondary | ICD-10-CM | POA: Diagnosis not present

## 2024-01-26 DIAGNOSIS — F250 Schizoaffective disorder, bipolar type: Secondary | ICD-10-CM

## 2024-01-26 DIAGNOSIS — F70 Mild intellectual disabilities: Secondary | ICD-10-CM | POA: Diagnosis not present

## 2024-01-26 MED ORDER — DULOXETINE HCL 30 MG PO CPEP: 30.0000 mg | ORAL_CAPSULE | Freq: Two times a day (BID) | ORAL | 5 refills | Status: DC

## 2024-01-26 MED ORDER — QUETIAPINE FUMARATE 100 MG PO TABS: 100.0000 mg | ORAL_TABLET | Freq: Every day | ORAL | 5 refills | Status: DC

## 2024-01-26 NOTE — Progress Notes (Signed)
 BH MD OP Progress Note  01/26/2024 9:35 AM IA LEEB  MRN:  [id number]  Chief Complaint:  Chief Complaint  Patient presents with   Follow-up   Depression   Insomnia   Medication Refill   HPI: Rita [name] is a 63 year old Caucasian female, single, on SSD, has a history of schizoaffective disorder bipolar type, intellectual disability mild, chronic kidney disease stage IIIb, hypertension, secondary hyperparathyroidism, migraine headaches, lives in married been in an adult family living Home was evaluated in office today.  Patient presented along with caretaker [name] who provided collateral information.  Patient's sister Ms. Lurlean Nanny is her legal guardian.  She appeared to be cheerful pleasant and calm.  Reports mood symptoms are stable.  She is currently taking Duloxetine (Cymbalta) at a dose of 90 mg daily, divided into 60 mg in the morning and 30 mg at night. She is compliant on the Seroquel, Clonazepam, Trazodone.  Denies side effects.  Denies thoughts of self-harm or harm to others.  Denies any perceptual disturbances.  She has lost 3-4 pounds recently, attributed to increased water intake and possibly dietary changes. She is not actively watching her diet but acknowledges the challenge of avoiding sweets, especially with frequent celebrations at her workplace. She engages in physical activities such as walking at the [facility].  There are no reported issues with sleep, appetite is stable, and she denies any muscle spasms, rigidity, tremors, or stiffness. Her recent blood work shows improvement in A1c, cholesterol, and triglyceride levels.Chronic kidney disease is being monitored with a current glomerular filtration rate (GFR) of 37. Recent blood work shows a slight increase in creatinine levels, indicating a need for careful monitoring of her kidney function.  As per caretaker patient is currently doing fairly well.  Visit Diagnosis:    ICD-10-CM   1. Schizoaffective disorder, bipolar  type (HCC)  F25.0 DULoxetine (CYMBALTA) 30 MG capsule    QUEtiapine (SEROQUEL) 100 MG tablet    2. Other insomnia  G47.09     3. Mild intellectual disability  F70    IQ 109      Past Psychiatric History: I have reviewed past psychiatric history from progress note on 06/30/2023.  Past trials of medications like Abilify, Seroquel, trazodone, Cymbalta, Wellbutrin, Topamax, chlorpromazine and Klonopin.  Past Medical History: Patient with history of seizure-like episodes later diagnosed with factitious disorder. Past Medical History:  Diagnosis Date   Adenomatous polyps    Allergy    Should not take Topamax or Ibuprofen because of  chronic kidney disease   Anxiety    Cataract    Cataract on one eye, not needing surgery yet   Chronic constipation    Chronic tension headaches    CKD (chronic kidney disease) stage 3, GFR 30-59 ml/min (HCC)    Depression    Factitious disorder    GERD (gastroesophageal reflux disease)    Hyperlipidemia    Hypertension    Intellectual disability due to developmental disorder, unspecified    Mental retardation    Microscopic hematuria    Migraine    Obesity     Past Surgical History:  Procedure Laterality Date   CARPAL TUNNEL RELEASE     COLONOSCOPY WITH PROPOFOL N/A 06/27/2017   Procedure: COLONOSCOPY WITH PROPOFOL;  Surgeon: Scot Jun, MD;  Location: [facility] ENDOSCOPY;  Service: Endoscopy;  Laterality: N/A;   COLONOSCOPY WITH PROPOFOL N/A 07/23/2022   Procedure: COLONOSCOPY WITH PROPOFOL;  Surgeon: Wyline Mood, MD;  Location: [facility] ENDOSCOPY;  Service: Gastroenterology;  Laterality: N/A;   CYSTOSCOPY W/ RETROGRADES Bilateral 11/15/2015   Procedure: CYSTOSCOPY WITH RETROGRADE PYELOGRAM;  Surgeon: Vanna Scotland, MD;  Location: [facility] ORS;  Service: Urology;  Laterality: Bilateral;   US ECHOCARDIOGRAPHY  2012   showed mild heart failure (LVEF 52%); small pericardial effusion    Family Psychiatric History: I have reviewed family psychiatric history  from progress note on 06/30/2023.  Family History:  Family History  Problem Relation Age of Onset   COPD Mother    Other Mother 13       "old age"   Dementia Mother    Heart disease Father    Hyperlipidemia Father    Kidney disease Father    Cancer Father    Hypertension Father    Obesity Father    Hyperlipidemia Sister    Hypertension Sister    Kidney disease Sister    Breast cancer Maternal Aunt    Stroke Maternal Grandmother    Heart disease Maternal Grandfather    Cancer Paternal Grandmother    Hyperlipidemia Paternal Grandmother    Hypertension Paternal Grandmother    Obesity Paternal Grandmother    Vision loss Paternal Grandmother    Alcohol abuse Paternal Grandfather    Intellectual disability Maternal Aunt    Learning disabilities Maternal Aunt    Prostate cancer Neg Hx     Social History: I have reviewed social history from progress note on 06/30/2023. Social History   Socioeconomic History   Marital status: Single    Spouse name: Not on file   Number of children: Not on file   Years of education: Not on file   Highest education level: 12th grade  Occupational History   Occupation: Consulting civil engineer   Occupation: disability  Tobacco Use   Smoking status: Never   Smokeless tobacco: Never  Vaping Use   Vaping status: Never Used  Substance and Sexual Activity   Alcohol use: No   Drug use: No   Sexual activity: Never  Other Topics Concern   Not on file  Social History Narrative   Goes to [employer]   Social Drivers of Health   Financial Resource Strain: Low Risk  (01/01/2024)   Overall Financial Resource Strain (CARDIA)    Difficulty of Paying Living Expenses: Not hard at all  Food Insecurity: No Food Insecurity (01/01/2024)   Hunger Vital Sign    Worried About Running Out of Food in the Last Year: Never true    Ran Out of Food in the Last Year: Never true  Transportation Needs: No Transportation Needs (01/01/2024)   PRAPARE - Scientist, research (physical sciences) (Medical): No    Lack of Transportation (Non-Medical): No  Physical Activity: Insufficiently Active (01/01/2024)   Exercise Vital Sign    Days of Exercise per Week: 2 days    Minutes of Exercise per Session: 10 min  Stress: No Stress Concern Present (01/01/2024)   [employer] of Occupational Health - Occupational Stress Questionnaire    Feeling of Stress : Only a little  Social Connections: Moderately Isolated (01/01/2024)   Social Connection and Isolation Panel [NHANES]    Frequency of Communication with Friends and Family: More than three times a week    Frequency of Social Gatherings with Friends and Family: More than three times a week    Attends Religious Services: More than 4 times per year    Active Member of [employer] or Organizations: No    Attends Banker Meetings: Never    Marital  Status: Never married    Allergies:  Allergies  Allergen Reactions   Advil [Ibuprofen] Other (See Comments)    Reaction:  Gives her kidney problems     Metabolic Disorder Labs: Lab Results  Component Value Date   HGBA1C 5.8 (H) 01/05/2024   Lab Results  Component Value Date   PROLACTIN 12.0 07/02/2023   Lab Results  Component Value Date   CHOL 147 01/05/2024   TRIG 204 (H) 01/05/2024   HDL 50 01/05/2024   CHOLHDL 4.5 (H) 07/14/2018   VLDL 62 (H) 04/25/2020   LDLCALC 64 01/05/2024   LDLCALC 72 07/02/2023   Lab Results  Component Value Date   TSH 1.950 01/05/2024   TSH 1.650 01/02/2023    Therapeutic Level Labs: No results found for: "LITHIUM" No results found for: "VALPROATE" No results found for: "CBMZ"  Current Medications: Current Outpatient Medications  Medication Sig Dispense Refill   Acetaminophen Extra Strength 500 MG TABS TAKE (2) TABLETS BY MOUTH EVERY SIX HOURS AS NEEDED. 240 tablet 4   aspirin 81 MG chewable tablet Chew 1 tablet (81 mg total) by mouth daily. 90 tablet 4   carvedilol (COREG) 6.25 MG tablet Take 6.25 mg by mouth 2  (two) times daily.     Cholecalciferol (GNP VITAMIN D3 EXTRA STRENGTH) 25 MCG (1000 UT) tablet TAKE TWO TABLETS BY MOUTH ONCE DAILY 180 tablet 0   clonazePAM (KLONOPIN) 0.5 MG tablet Take 1 tablet (0.5 mg total) by mouth 2 (two) times daily. 60 tablet 5   diclofenac Sodium (VOLTAREN) 1 % GEL Apply 2 g topically 3 (three) times daily. 100 g 4   docusate sodium (COLACE) 100 MG capsule TAKE (1) CAPSULE BY MOUTH TWICE DAILY AS NEEDED. 180 capsule 4   GNP COUGH DM ER 30 MG/5ML liquid GIVE 2.5MLS BY MOUTH AS NEEDED FOR COUGH. 30 mL 0   icosapent Ethyl (VASCEPA) 1 g capsule Take 2 capsules (2 g total) by mouth 2 (two) times daily. 360 capsule 4   lisinopril (ZESTRIL) 5 MG tablet Take 1 tablet (5 mg total) by mouth daily. 90 tablet 4   magnesium oxide (MAG-OX) 400 (240 Mg) MG tablet Take 1 tablet (400 mg total) by mouth daily. 90 tablet 4   Multiple Vitamin (TAB-A-VITE) TABS TAKE ONE TABLET BY MOUTH EVERY DAY 30 tablet 12   omeprazole (PRILOSEC) 20 MG capsule TAKE (1) CAPSULE BY MOUTH ONCE DAILY. 90 capsule 4   rosuvastatin (CRESTOR) 40 MG tablet Take 1 tablet (40 mg total) by mouth daily. for cholesterol 90 tablet 4   sodium fluoride (FLUORISHIELD) 1.1 % GEL dental gel Take by mouth.     SUMAtriptan (IMITREX) 100 MG tablet Take 1 tablet (100 mg total) by mouth every 2 (two) hours as needed for migraine. 10 tablet 0   traZODone (DESYREL) 150 MG tablet TAKE ONE TABLET BY MOUTH AT BEDTIME 30 tablet 5   DULoxetine (CYMBALTA) 30 MG capsule Take 1 capsule (30 mg total) by mouth 2 (two) times daily. Dose reduction, stop 60 mg caps 60 capsule 5   QUEtiapine (SEROQUEL) 100 MG tablet Take 1 tablet (100 mg total) by mouth at bedtime. 30 tablet 5   No current facility-administered medications for this visit.     Musculoskeletal: Strength & Muscle Tone: within normal limits Gait & Station: normal Patient leans: N/A  Psychiatric Specialty Exam: Review of Systems  Psychiatric/Behavioral: Negative.       Blood pressure 118/80, pulse 96, temperature 97.8 F (36.6 C), temperature source Temporal,  height 4' 11.8" (1.519 m), weight 227 lb 12.8 oz (103.3 kg), SpO2 96%.Body mass index is 44.79 kg/m.  General Appearance: Casual  Eye Contact:  Poor  Speech:  Clear and Coherent  Volume:  Normal  Mood:  Euthymic  Affect:  Appropriate  Thought Process:  Goal Directed and Descriptions of Associations: Intact  Orientation:  Full (Time, Place, and Person)  Thought Content: Logical   Suicidal Thoughts:  No  Homicidal Thoughts:  No  Memory:  Immediate;   Fair Recent;   Fair Remote;   limited  Judgement:  Fair  Insight:  Shallow  Psychomotor Activity:  Normal  Concentration:  Concentration: Fair and Attention Span: Fair  Recall:  [employer] of Knowledge: Fair  Language: Fair  Akathisia:  No  Handed:  Right  AIMS (if indicated): done  Assets:  Desire for Improvement Housing Social Support Transportation  ADL's:  Intact  Cognition: WNL  Sleep:  Fair   Screenings: Midwife Visit from 01/26/2024 in [facility] Office Visit from 09/29/2023 in [facility] Office Visit from 06/30/2023 in [facility]  AIMS Total Score 0 0 0      GAD-7    Flowsheet Row Office Visit from 01/05/2024 in [facility] Office Visit from 09/29/2023 in [facility] Office Visit from 07/02/2023 in [facility] Office Visit from 06/30/2023 in [facility] Office Visit from 01/02/2023 in [facility]  Total GAD-7 Score 1 1 0 0 0      PHQ2-9    Flowsheet Row Office Visit from 01/26/2024 in [facility] Office Visit from 01/05/2024 in [facility] Clinical Support from  12/09/2023 in [facility] Office Visit from 09/29/2023 in [facility] Office Visit from 07/02/2023 in [facility]  PHQ-2 Total Score 0 0 0 0 0  PHQ-9 Total Score -- 0 -- -- 0      Flowsheet Row Office Visit from 01/26/2024 in [facility] Office Visit from 09/29/2023 in [facility] Office Visit from 06/30/2023 in [facility]  C-SSRS RISK CATEGORY No Risk No Risk No Risk        Assessment and Plan: FALICIA LIZOTTE is a 63 year old Caucasian female, single, on SSD, has a history of schizoaffective disorder, intellectual disability mild, multiple medical problems including chronic kidney disease was evaluated in office today.  Discussed assessment and plan as noted below.  Schizoaffective disorder bipolar type-stable Insomnia-stable Intellectual disability mild-chronic-stable Ongoing management with psychotropics.  Adjusted Duloxetine dosage due to CKD. No current thoughts of self-harm or harm to others. Discussed potential need for alternative medications if Duloxetine is discontinued due to worsening kidney function. - Continue Clonazepam 0.5 mg twice daily. - Continue Seroquel 100 mg at bedtime - Reduce Duloxetine to 60 mg daily in divided dosage due to renal impairment. - Continue Trazodone 150 mg at bedtime for now.  Consider dose reduction in the future due to renal problems. - Monitor mental health status - Consider alternative medications if Duloxetine needs to be discontinued. - Continue attending ABLE program at [facility].  Labs reviewed-CMP-creatinine 1.56, GFR 37 dated 01/05/2024, CBC with differential within normal limits, lipid panel-improved, triglycerides improved at 204  otherwise within normal limits, TSH-within normal limits.  Hemoglobin A1c improved at 5.8. - Monitor kidney  function closely - Follow up with nephrologist in March   Follow-up - Schedule follow-up appointment in two months - Send MyChart message with updated kidney function results after nephrologist visit.  Collaboration of Care: Collaboration of Care: Other patient advised to follow up with primary care provider and nephrologist.  Collateral information obtained from caregiver-[name]  Patient/Guardian was advised Release of Information must be obtained prior to any record release in order to collaborate their care with an outside provider. Patient/Guardian was advised if they have not already done so to contact the registration department to sign all necessary forms in order for [place] to release information regarding their care.   Consent: Patient/Guardian gives verbal consent for treatment and assignment of benefits for services provided during this visit. Patient/Guardian expressed understanding and agreed to proceed.   This note was generated in part or whole with voice recognition software. Voice recognition is usually quite accurate but there are transcription errors that can and very often do occur. I apologize for any typographical errors that were not detected and corrected.    Jomarie Longs, MD 01/26/2024, 9:35 AM

## 2024-02-25 ENCOUNTER — Ambulatory Visit: Payer: 59 | Admitting: Family Medicine

## 2024-03-15 ENCOUNTER — Other Ambulatory Visit: Payer: Self-pay | Admitting: Nurse Practitioner

## 2024-03-16 NOTE — Telephone Encounter (Signed)
 Requested Prescriptions  Pending Prescriptions Disp Refills   GNP VITAMIN D3 EXTRA STRENGTH 25 MCG (1000 UT) tablet [Pharmacy Med Name: cholecalciferol (vitamin D3) 25 mcg (1,000 unit) tablet] 60 tablet 2    Sig: TAKE TWO TABLETS BY MOUTH ONCE DAILY     Endocrinology:  Vitamins - Vitamin D Supplementation 2 Failed - 03/16/2024 11:02 AM      Failed - Manual Review: Route requests for 50,000 IU strength to the provider      Passed - Ca in normal range and within 360 days    Calcium  Date Value Ref Range Status  01/05/2024 9.2 8.7 - 10.3 mg/dL Final   Calcium, Total  Date Value Ref Range Status  12/28/2013 8.6 8.5 - 10.1 mg/dL Final         Passed - Vitamin D in normal range and within 360 days    Vit D, 25-Hydroxy  Date Value Ref Range Status  01/05/2024 34.8 30.0 - 100.0 ng/mL Final    Comment:    Vitamin D deficiency has been defined by the Institute of Medicine and an Endocrine Society practice guideline as a level of serum 25-OH vitamin D less than 20 ng/mL (1,2). The Endocrine Society went on to further define vitamin D insufficiency as a level between 21 and 29 ng/mL (2). 1. IOM (Institute of Medicine). 2010. Dietary reference    intakes for calcium and D. [place]: The    [employer]. 2. Holick MF, Binkley [place], Bischoff-Ferrari HA, et al.    Evaluation, treatment, and prevention of vitamin D    deficiency: an Endocrine Society clinical practice    guideline. JCEM. 2011 Jul; 96(7):1911-30.          Passed - Valid encounter within last 12 months    Recent Outpatient Visits           2 months ago Schizoaffective disorder, bipolar type (HCC)   [facility] [place], Lavelle Posey, NP       Future Appointments             In 3 months Cannady, Jolene T, NP [facility]

## 2024-03-24 ENCOUNTER — Encounter: Payer: Self-pay | Admitting: Nurse Practitioner

## 2024-03-29 ENCOUNTER — Encounter: Payer: Self-pay | Admitting: Psychiatry

## 2024-03-29 ENCOUNTER — Ambulatory Visit (INDEPENDENT_AMBULATORY_CARE_PROVIDER_SITE_OTHER): Payer: 59 | Admitting: Psychiatry

## 2024-03-29 ENCOUNTER — Other Ambulatory Visit: Payer: Self-pay

## 2024-03-29 VITALS — BP 132/81 | HR 74 | Temp 97.4°F | Ht 59.8 in | Wt 232.4 lb

## 2024-03-29 DIAGNOSIS — F70 Mild intellectual disabilities: Secondary | ICD-10-CM

## 2024-03-29 DIAGNOSIS — F250 Schizoaffective disorder, bipolar type: Secondary | ICD-10-CM

## 2024-03-29 DIAGNOSIS — G4709 Other insomnia: Secondary | ICD-10-CM

## 2024-03-29 NOTE — Progress Notes (Signed)
 BH MD OP Progress Note  03/29/2024 9:30 AM [name]  MRN:  [id number]  Chief Complaint:  Chief Complaint  Patient presents with   Follow-up   Schizoaffective disorder   Medication Refill   Discussed the use of AI scribe software for clinical note transcription with the patient, who gave verbal consent to proceed.  History of Present Illness Rita [name] is a 63 year old Caucasian female, single, on SSI, has a history of schizoaffective disorder, intellectual disability mild, chronic kidney disease stage IIIb, hypertension, secondary hyperparathyroidism, migraine headaches, lives in an adult family living Home in [place] who presents for psychiatric follow-up.  Patient presented along with caretaker Ms. Nova Began.  She has a history of schizoaffective disorder and is on a stable medication regimen, including cymbalta  60 mg, clonazepam  twice daily, Seroquel , and trazodone  nightly for sleep. Since the medication adjustment in February, there have been no significant changes in mood or behavior. She experiences occasional aggravation with her sister but remains generally happy and focused on activities.  She has chronic kidney disease, stage three, with a creatinine level of 1.69. Her kidney function is monitored by a nephrologist every three months, with no new concerns. She experiences leg pain, which was evaluated with an MRI, revealing scoliosis and being overweight.  She reports sleeping well, although she sometimes wakes up during the night. Trazodone  is used nightly to aid her sleep. She is active, attending the [employer] and participating in volunteer activities weekly. She also engages in educational activities and social events, such as attending classes and outings with her school group.  She enjoys spending time with her family, including visiting her sister and father, and participating in church activities. She has a close bond with her pets, which affects her mood when they are  unwell.    Visit Diagnosis:    ICD-10-CM   1. Schizoaffective disorder, bipolar type (HCC)  F25.0     2. Other insomnia  G47.09    Likely due to lack of sleep hygiene    3. Mild intellectual disability  F70    IQ 75      Past Psychiatric History: I have reviewed past psychiatric history from progress note on 06/30/2023.  Past trials of medications like Abilify, Seroquel , trazodone , Cymbalta , Wellbutrin, Topamax, chlorpromazine, Klonopin   Past Medical History: Patient with history of seizure-like spells later diagnosed with factitious disorder. Past Medical History:  Diagnosis Date   Adenomatous polyps    Allergy    Should not take Topamax or Ibuprofen because of  chronic kidney disease   Anxiety    Cataract    Cataract on one eye, not needing surgery yet   Chronic constipation    Chronic tension headaches    CKD (chronic kidney disease) stage 3, GFR 30-59 ml/min (HCC)    Depression    Factitious disorder    GERD (gastroesophageal reflux disease)    Hyperlipidemia    Hypertension    Intellectual disability due to developmental disorder, unspecified    Mental retardation    Microscopic hematuria    Migraine    Obesity     Past Surgical History:  Procedure Laterality Date   CARPAL TUNNEL RELEASE     COLONOSCOPY WITH PROPOFOL  N/A 06/27/2017   Procedure: COLONOSCOPY WITH PROPOFOL ;  Surgeon: Cassie Click, MD;  Location: [facility] ENDOSCOPY;  Service: Endoscopy;  Laterality: N/A;   COLONOSCOPY WITH PROPOFOL  N/A 07/23/2022   Procedure: COLONOSCOPY WITH PROPOFOL ;  Surgeon: Luke Salaam, MD;  Location: [facility]  ENDOSCOPY;  Service: Gastroenterology;  Laterality: N/A;   CYSTOSCOPY W/ RETROGRADES Bilateral 11/15/2015   Procedure: CYSTOSCOPY WITH RETROGRADE PYELOGRAM;  Surgeon: Dustin Gimenez, MD;  Location: [facility] ORS;  Service: Urology;  Laterality: Bilateral;   US  ECHOCARDIOGRAPHY  2012   showed mild heart failure (LVEF 52%); small pericardial effusion    Family Psychiatric History:  I have reviewed family psychiatric history from progress note on 06/30/2023.  Family History:  Family History  Problem Relation Age of Onset   COPD Mother    Other Mother 36       "old age"   Dementia Mother    Heart disease Father    Hyperlipidemia Father    Kidney disease Father    Cancer Father    Hypertension Father    Obesity Father    Hyperlipidemia Sister    Hypertension Sister    Kidney disease Sister    Breast cancer Maternal Aunt    Stroke Maternal Grandmother    Heart disease Maternal Grandfather    Cancer Paternal Grandmother    Hyperlipidemia Paternal Grandmother    Hypertension Paternal Grandmother    Obesity Paternal Grandmother    Vision loss Paternal Grandmother    Alcohol abuse Paternal Grandfather    Intellectual disability Maternal Aunt    Learning disabilities Maternal Aunt    Prostate cancer Neg Hx     Social History: I have reviewed social history from progress note on 06/30/2023. Social History   Socioeconomic History   Marital status: Single    Spouse name: Not on file   Number of children: Not on file   Years of education: Not on file   Highest education level: 12th grade  Occupational History   Occupation: Consulting civil engineer   Occupation: disability  Tobacco Use   Smoking status: Never   Smokeless tobacco: Never  Vaping Use   Vaping status: Never Used  Substance and Sexual Activity   Alcohol use: No   Drug use: No   Sexual activity: Never  Other Topics Concern   Not on file  Social History Narrative   Goes to [employer]   Social Drivers of Health   Financial Resource Strain: Low Risk  (01/01/2024)   Overall Financial Resource Strain (CARDIA)    Difficulty of Paying Living Expenses: Not hard at all  Food Insecurity: No Food Insecurity (01/01/2024)   Hunger Vital Sign    Worried About Running Out of Food in the Last Year: Never true    Ran Out of Food in the Last Year: Never true  Transportation Needs: No Transportation Needs (01/01/2024)    PRAPARE - Administrator, Civil Service (Medical): No    Lack of Transportation (Non-Medical): No  Physical Activity: Insufficiently Active (01/01/2024)   Exercise Vital Sign    Days of Exercise per Week: 2 days    Minutes of Exercise per Session: 10 min  Stress: No Stress Concern Present (01/01/2024)   [employer] of Occupational Health - Occupational Stress Questionnaire    Feeling of Stress : Only a little  Social Connections: Moderately Isolated (01/01/2024)   Social Connection and Isolation Panel [NHANES]    Frequency of Communication with Friends and Family: More than three times a week    Frequency of Social Gatherings with Friends and Family: More than three times a week    Attends Religious Services: More than 4 times per year    Active Member of [employer] or Organizations: No    Attends Banker Meetings:  Never    Marital Status: Never married    Allergies:  Allergies  Allergen Reactions   Advil [Ibuprofen] Other (See Comments)    Reaction:  Gives her kidney problems     Metabolic Disorder Labs: Lab Results  Component Value Date   HGBA1C 5.8 (H) 01/05/2024   Lab Results  Component Value Date   PROLACTIN 12.0 07/02/2023   Lab Results  Component Value Date   CHOL 147 01/05/2024   TRIG 204 (H) 01/05/2024   HDL 50 01/05/2024   CHOLHDL 4.5 (H) 07/14/2018   VLDL 62 (H) 04/25/2020   LDLCALC 64 01/05/2024   LDLCALC 72 07/02/2023   Lab Results  Component Value Date   TSH 1.950 01/05/2024   TSH 1.650 01/02/2023    Therapeutic Level Labs: No results found for: "LITHIUM" No results found for: "VALPROATE" No results found for: "CBMZ"  Current Medications: Current Outpatient Medications  Medication Sig Dispense Refill   Acetaminophen  Extra Strength 500 MG TABS TAKE (2) TABLETS BY MOUTH EVERY SIX HOURS AS NEEDED. 240 tablet 4   aspirin  81 MG chewable tablet Chew 1 tablet (81 mg total) by mouth daily. 90 tablet 4   carvedilol (COREG) 6.25  MG tablet Take 6.25 mg by mouth 2 (two) times daily.     Cholecalciferol  (GNP VITAMIN D3 EXTRA STRENGTH) 25 MCG (1000 UT) tablet TAKE TWO TABLETS BY MOUTH ONCE DAILY 180 tablet 1   clonazePAM  (KLONOPIN ) 0.5 MG tablet Take 1 tablet (0.5 mg total) by mouth 2 (two) times daily. 60 tablet 5   diclofenac  Sodium (VOLTAREN ) 1 % GEL Apply 2 g topically 3 (three) times daily. 100 g 4   docusate sodium  (COLACE) 100 MG capsule TAKE (1) CAPSULE BY MOUTH TWICE DAILY AS NEEDED. 180 capsule 4   DULoxetine  (CYMBALTA ) 30 MG capsule Take 1 capsule (30 mg total) by mouth 2 (two) times daily. Dose reduction, stop 60 mg caps 60 capsule 5   GNP COUGH DM ER 30 MG/5ML liquid GIVE 2.5MLS BY MOUTH AS NEEDED FOR COUGH. 30 mL 0   icosapent  Ethyl (VASCEPA ) 1 g capsule Take 2 capsules (2 g total) by mouth 2 (two) times daily. 360 capsule 4   lisinopril  (ZESTRIL ) 5 MG tablet Take 1 tablet (5 mg total) by mouth daily. 90 tablet 4   magnesium  oxide (MAG-OX) 400 (240 Mg) MG tablet Take 1 tablet (400 mg total) by mouth daily. 90 tablet 4   Multiple Vitamin (TAB-A-VITE) TABS TAKE ONE TABLET BY MOUTH EVERY DAY 30 tablet 12   omeprazole  (PRILOSEC) 20 MG capsule TAKE (1) CAPSULE BY MOUTH ONCE DAILY. 90 capsule 4   QUEtiapine  (SEROQUEL ) 100 MG tablet Take 1 tablet (100 mg total) by mouth at bedtime. 30 tablet 5   rosuvastatin  (CRESTOR ) 40 MG tablet Take 1 tablet (40 mg total) by mouth daily. for cholesterol 90 tablet 4   sodium fluoride (FLUORISHIELD) 1.1 % GEL dental gel Take by mouth.     SUMAtriptan  (IMITREX ) 100 MG tablet Take 1 tablet (100 mg total) by mouth every 2 (two) hours as needed for migraine. 10 tablet 0   traZODone  (DESYREL ) 150 MG tablet TAKE ONE TABLET BY MOUTH AT BEDTIME 30 tablet 5   No current facility-administered medications for this visit.     Musculoskeletal: Strength & Muscle Tone:  UTA Gait & Station:  Seated Patient leans: N/A  Psychiatric Specialty Exam: Review of Systems  Psychiatric/Behavioral:  Negative.      Blood pressure 132/81, pulse 74, temperature (!) 97.4  F (36.3 C), temperature source Temporal, height 4' 11.8" (1.519 m), weight 232 lb 6.4 oz (105.4 kg).Body mass index is 45.69 kg/m.  General Appearance: Casual  Eye Contact:  Good  Speech:  Normal Rate  Volume:  Normal  Mood:  Euthymic  Affect:  Appropriate  Thought Process:  Goal Directed and Descriptions of Associations: Intact  Orientation:  Full (Time, Place, and Person)  Thought Content: Logical   Suicidal Thoughts:  No  Homicidal Thoughts:  No  Memory:  Immediate;   Fair Recent;   Fair Remote;   Limited  Judgement:  Fair  Insight:  Shallow  Psychomotor Activity:  Normal  Concentration:  Concentration: Fair and Attention Span: Fair  Recall:  [employer] of Knowledge: Fair  Language: Fair  Akathisia:  No  Handed:  Right  AIMS (if indicated): done  Assets:  Desire for Improvement Housing Social Support  ADL's:  Intact  Cognition: WNL  Sleep:  Fair   Screenings: AIMS    Flowsheet Row Office Visit from 03/29/2024 in [facility] Office Visit from 01/26/2024 in [facility] Office Visit from 09/29/2023 in [facility] Office Visit from 06/30/2023 in [facility]  AIMS Total Score 0 0 0 0      GAD-7    Flowsheet Row Office Visit from 01/05/2024 in [facility] Office Visit from 09/29/2023 in [facility] Office Visit from 07/02/2023 in [facility] Office Visit from 06/30/2023 in [facility] Office Visit from 01/02/2023 in [facility]  Total GAD-7 Score 1 1 0 0 0      PHQ2-9    Flowsheet Row Office Visit from 01/26/2024 in [facility] Office Visit from  01/05/2024 in [facility] Clinical Support from 12/09/2023 in [facility] Office Visit from 09/29/2023 in [facility] Office Visit from 07/02/2023 in [facility]  PHQ-2 Total Score 0 0 0 0 0  PHQ-9 Total Score -- 0 -- -- 0      Flowsheet Row Office Visit from 03/29/2024 in [facility] Office Visit from 01/26/2024 in [facility] Office Visit from 09/29/2023 in [facility]  C-SSRS RISK CATEGORY No Risk No Risk No Risk        Assessment and Plan: Rita [name] is a 63 year old Caucasian female, single, on SSD, has a history of schizoaffective disorder, intellectual disability mild, multiple medical problems including chronic kidney disease was evaluated in office today, currently stable on current medication regimen, discussed assessment and plan as noted below.  Assessment & Plan Schizoaffective disorder-stable Schizoaffective disorder is well-managed on current medication regimen. No changes in mood or behavior since the last visit. Current medications are considered safe given her condition, but adjustments may be necessary if renal function declines. - Continue Cymbalta  60 mg daily in divided dosage-reduced dosage - Continue Seroquel  100 mg at bedtime - Continue Clonazepam  0.5 mg twice daily - Reviewed [place] AWARxE - Discuss psychiatric medications with nephrologist to ensure renal safety.  Insomnia-stable Sleep disorder managed with nightly trazodone . Sleep pattern varies with activity level and fatigue. Trazodone 's renal clearance is a consideration due to her kidney disease. - Continue Trazodone  150 mg at bedtime -  Discuss Trazodone  use with nephrologist to ensure renal safety.  Intellectual disability mild-chronic-stable Patient currently has good  support system, sister is legal guardian and patient lives in an adult living home. - Collateral information obtained from caretaker who was present in session today. - Patient to continue to make use of support system.  Chronic kidney disease, stage 3 Chronic kidney disease stage 3 with stable creatinine levels.  Medications reviewed for renal clearance considerations. Potential need to adjust medications if renal function declines. - Continue regular follow-ups with nephrologist every three months. - Monitor kidney function and adjust psychiatric medications if renal function declines.   Follow-up Follow-up in clinic in 4 months or sooner if needed.   Collaboration of Care: Collaboration of Care: Other encouraged to continue follow-up with nephrology, collateral information obtained from caretaker who was present in session  Patient/Guardian was advised Release of Information must be obtained prior to any record release in order to collaborate their care with an outside provider. Patient/Guardian was advised if they have not already done so to contact the registration department to sign all necessary forms in order for us  to release information regarding their care.   Consent: Patient/Guardian gives verbal consent for treatment and assignment of benefits for services provided during this visit. Patient/Guardian expressed understanding and agreed to proceed.  This note was generated in part or whole with voice recognition software. Voice recognition is usually quite accurate but there are transcription errors that can and very often do occur. I apologize for any typographical errors that were not detected and corrected.     Chonda Baney, MD 03/29/2024, 10:24 AM

## 2024-06-10 ENCOUNTER — Other Ambulatory Visit: Payer: Self-pay | Admitting: Psychiatry

## 2024-06-10 DIAGNOSIS — G4700 Insomnia, unspecified: Secondary | ICD-10-CM

## 2024-06-10 DIAGNOSIS — F250 Schizoaffective disorder, bipolar type: Secondary | ICD-10-CM

## 2024-06-10 DIAGNOSIS — F79 Unspecified intellectual disabilities: Secondary | ICD-10-CM

## 2024-06-22 ENCOUNTER — Other Ambulatory Visit: Payer: Self-pay | Admitting: Psychiatry

## 2024-06-22 DIAGNOSIS — F250 Schizoaffective disorder, bipolar type: Secondary | ICD-10-CM

## 2024-06-22 DIAGNOSIS — G4700 Insomnia, unspecified: Secondary | ICD-10-CM

## 2024-06-22 DIAGNOSIS — F79 Unspecified intellectual disabilities: Secondary | ICD-10-CM

## 2024-06-29 ENCOUNTER — Telehealth: Payer: Self-pay

## 2024-06-29 DIAGNOSIS — G4700 Insomnia, unspecified: Secondary | ICD-10-CM

## 2024-06-29 DIAGNOSIS — F250 Schizoaffective disorder, bipolar type: Secondary | ICD-10-CM

## 2024-06-29 NOTE — Telephone Encounter (Signed)
 received voicemail that pt needs refills on the trazodone  and the seroquel . pt was last seen on 4-28 next appt 9-4

## 2024-06-30 MED ORDER — TRAZODONE HCL 150 MG PO TABS: 150.0000 mg | ORAL_TABLET | Freq: Every day | ORAL | 5 refills | Status: DC

## 2024-06-30 MED ORDER — QUETIAPINE FUMARATE 100 MG PO TABS: 100.0000 mg | ORAL_TABLET | Freq: Every day | ORAL | 5 refills | Status: DC

## 2024-06-30 NOTE — Telephone Encounter (Signed)
pt caregiver notified

## 2024-06-30 NOTE — Telephone Encounter (Signed)
 I have sent trazodone  and Seroquel  to pharmacy as requested.

## 2024-07-02 ENCOUNTER — Other Ambulatory Visit: Payer: Self-pay | Admitting: Psychiatry

## 2024-07-02 DIAGNOSIS — G4700 Insomnia, unspecified: Secondary | ICD-10-CM

## 2024-07-02 DIAGNOSIS — F79 Unspecified intellectual disabilities: Secondary | ICD-10-CM

## 2024-07-02 DIAGNOSIS — F250 Schizoaffective disorder, bipolar type: Secondary | ICD-10-CM

## 2024-07-02 NOTE — Telephone Encounter (Signed)
 I have approved refills for Cymbalta  and clonazepam .

## 2024-07-04 NOTE — Patient Instructions (Signed)
 Be Involved in Caring For Your Health:  Taking Medications When medications are taken as directed, they can greatly improve your health. But if they are not taken as prescribed, they may not work. In some cases, not taking them correctly can be harmful. To help ensure your treatment remains effective and safe, understand your medications and how to take them. Bring your medications to each visit for review by your provider.  Your lab results, notes, and after visit summary will be available on My Chart. We strongly encourage you to use this feature. If lab results are abnormal the clinic will contact you with the appropriate steps. If the clinic does not contact you assume the results are satisfactory. You can always view your results on My Chart. If you have questions regarding your health or results, please contact the clinic during office hours. You can also ask questions on My Chart.  We at [facility] are grateful that you chose [place] to provide your care. We strive to provide evidence-based and compassionate care and are always looking for feedback. If you get a survey from the clinic please complete this so we can hear your opinions.  Chronic Kidney Disease: Eating Plan Chronic kidney disease (CKD) is when your kidneys aren't working well. They can't remove waste, fluids, and other substances from your blood. When these substances build up, they can worsen kidney damage and affect your health. Eating certain foods can lead to a buildup of these substances. Changing your diet can help prevent more kidney damage. Diet changes may also delay dialysis or even keep you from needing it. What nutrients should I limit? Work with your health care team and an expert in healthy eating (dietitian) to make a meal plan that's right for you. Foods you can eat and foods you should limit or avoid will depend on: The stage of your kidney disease. Any other conditions you have. The items listed below  are not a complete list. Talk with a dietitian to learn what's best for you. Potassium Potassium affects how well your heart beats. Too much potassium in your blood can cause an irregular heartbeat or even a heart attack. You may need to limit foods that are high in potassium, such as: Liquid milk and soy milk. Salt substitutes that contain potassium. Fruits like: Bananas. Apricots. Melon. Prunes and raisins. Kiwi. Nectarines and oranges. Vegetables, such as: Potatoes, sweet potatoes, and yams. Tomatoes. Leafy greens. Beets. Avocado. Pumpkin and winter squash. Beans, like lima beans. Nuts. Phosphorus Phosphorus is a mineral found in your bones. You need a balance between calcium and phosphorus to build and maintain healthy bones.  Too much added phosphorus from the foods you eat can pull calcium from your bones. Losing calcium can make your bones weak and more likely to break. Too much phosphorus can also make your skin itch. You may need to limit foods that are high in phosphorus or that have added phosphorus, such as: Liquid milk and dairy products. Dark-colored sodas or soft drinks. Bran cereals and oatmeal. Protein  Protein helps your body make and keep muscle. Protein also helps to repair your body's cells and tissues.  One of the natural breakdown products of protein is a waste product called urea. When your kidneys aren't working well, they can't remove waste like urea. Reducing protein in your diet can help keep urea from building up in your blood. Depending on your stage of kidney disease, you may need to eat smaller portions of foods that  are high in protein. Sources of animal protein include: Meat (all types). Fish and seafood. Poultry. Eggs. Dairy. Other protein foods include: Beans and legumes. Nuts and nut butter. Soy, like tofu.  Sodium Salt (sodium) helps to keep a healthy balance of fluids in your body. Too much salt can increase your blood pressure,  which can harm your heart and lungs. Extra salt can also cause your body to keep too much fluid, making your kidneys work harder. You may need to limit or avoid foods that are high in salt, such as: Salt seasonings. Soy and teriyaki sauce. Meats that are: Packaged. Precooked. Cured. Processed. Salted crackers and snack foods. Fast food. Canned soups and foods. Pickled foods. Boxed mixes or ready-to-eat boxed meals and side dishes. Bottled dressings, sauces, and marinades. Talk with your dietitian about how much potassium, phosphorus, protein, and salt you may have each day. What are tips for following this plan? Reading food labels  Check the amount of salt in foods. Limit foods that have salt listed among the first five ingredients. Try to eat low-salt foods. Check the ingredient list for added phosphorus or potassium. "Phos" in an ingredient is a sign that phosphorus has been added. Do not buy foods that are calcium-enriched or that have calcium added to them (fortified). Buy canned vegetables and beans that say "no salt added." Rinse them before eating. Lifestyle Limit the amount of protein you eat from animal sources each day. Focus on protein from plant sources, like tofu and dried beans, peas, and lentils. Do not add salt to food when cooking or before eating. Do not eat star fruit. It can be toxic for people with kidney problems. Talk with your health care provider before taking any vitamin or mineral supplements. If told by your provider: Track how much liquid you have so you can avoid drinking too much. Try to eat foods that are made mostly from water, like gelatin, ice cream, soups, and juicy fruits and vegetables. If you have diabetes and chronic kidney disease: If you have diabetes and CKD, you need to keep your blood sugar (glucose) in the target range recommended by your provider. Follow your diabetes management plan. This may include: Checking your blood glucose  regularly. Taking medicines by mouth, or taking insulin, or both. Exercising for at least 30 minutes on 5 or more days each week, or as told by your provider. Tracking how many servings of carbohydrates you eat at each meal. Not using orange juice to treat low blood sugars. Instead, use apple juice, cranberry juice, or clear soda. You may be given guidelines on what foods and nutrients you may eat, and how much you can have each day. This depends on your stage of kidney disease and whether you have high blood pressure. Follow the meal plan your dietitian gives you. Where to find more information [employer] of Diabetes and Digestive and Kidney Diseases: StageSync.si National Kidney Foundation: kidney.org This information is not intended to replace advice given to you by your health care provider. Make sure you discuss any questions you have with your health care provider. Document Revised: 07/01/2023 Document Reviewed: 07/01/2023 Elsevier Patient Education  2024 [employer].

## 2024-07-09 ENCOUNTER — Ambulatory Visit (INDEPENDENT_AMBULATORY_CARE_PROVIDER_SITE_OTHER): Payer: 59 | Admitting: Nurse Practitioner

## 2024-07-09 ENCOUNTER — Encounter: Payer: Self-pay | Admitting: Nurse Practitioner

## 2024-07-09 VITALS — BP 108/70 | HR 69 | Temp 97.8°F | Ht 59.8 in | Wt 230.6 lb

## 2024-07-09 DIAGNOSIS — E782 Mixed hyperlipidemia: Secondary | ICD-10-CM

## 2024-07-09 DIAGNOSIS — F70 Mild intellectual disabilities: Secondary | ICD-10-CM

## 2024-07-09 DIAGNOSIS — I10 Essential (primary) hypertension: Secondary | ICD-10-CM

## 2024-07-09 DIAGNOSIS — R7301 Impaired fasting glucose: Secondary | ICD-10-CM

## 2024-07-09 DIAGNOSIS — F250 Schizoaffective disorder, bipolar type: Secondary | ICD-10-CM | POA: Diagnosis not present

## 2024-07-09 DIAGNOSIS — E210 Primary hyperparathyroidism: Secondary | ICD-10-CM

## 2024-07-09 DIAGNOSIS — F5101 Primary insomnia: Secondary | ICD-10-CM

## 2024-07-09 DIAGNOSIS — Z23 Encounter for immunization: Secondary | ICD-10-CM | POA: Diagnosis not present

## 2024-07-09 DIAGNOSIS — N1832 Chronic kidney disease, stage 3b: Secondary | ICD-10-CM | POA: Diagnosis not present

## 2024-07-09 LAB — BAYER DCA HB A1C WAIVED: HB A1C (BAYER DCA - WAIVED): 5.9 % — ABNORMAL HIGH (ref 4.8–5.6)

## 2024-07-09 NOTE — Progress Notes (Addendum)
 BP 108/70   Pulse 69   Temp 97.8 F (36.6 C) (Oral)   Ht 4' 11.8 (1.519 m)   Wt 230 lb 9.6 oz (104.6 kg)   LMP  (LMP Unknown)   SpO2 98%   BMI 45.34 kg/m    Subjective:    Patient ID: Rita [name], female    DOB: Oct 05, 1961, 63 y.o.   MRN: [id number]  HPI: Rita [name] is a 63 y.o. female  Chief Complaint  Patient presents with   Chronic Kidney Disease   Depression   Hyperlipidemia   Hypertension   IFG   Support person at bedside with patient.  HYPERTENSION / HYPERLIPIDEMIA Taking Crestor , Lisinopril  and Carvedilol + ASA. Satisfied with current treatment? no Duration of hypertension: chronic BP monitoring frequency: not checking BP range:  BP medication side effects: no Duration of hyperlipidemia: chronic Cholesterol medication side effects: no Cholesterol supplements: none Medication compliance: excellent compliance Aspirin : yes Recent stressors: no Recurrent headaches: no Visual changes: no Palpitations: no Dyspnea: no Chest pain: no Lower extremity edema: no Dizzy/lightheaded: no   CHRONIC KIDNEY DISEASE (3a to 3b) Saw nephrology last on 06/22/24, CRT 1.72, eGFR 33, PTH 40. CKD status: stable Medications renally dose: no Previous renal evaluation: yes Pneumovax:  Up to Date Influenza Vaccine:  Up to Date   Impaired Fasting Glucose Drinks occasional sodas, working on diet. HbA1C:  Lab Results  Component Value Date   HGBA1C 5.8 (H) 01/05/2024  Duration of elevated blood sugar: chronic Polydipsia: no Polyuria: no Weight change: no Visual disturbance: no Glucose Monitoring: no    Accucheck frequency: Not Checking    Fasting glucose:     Post prandial:  Diabetic Education: Not Completed Family history of diabetes: yes  SCHIZOAFFECTIVE DISORDER Does have learning disabilities and lives in stable group home. Continues Klonopin , Seroquel , + Cymbalta .  Follows with psychiatry, last visit 03/29/24 with Dr. Coby. Sister gets mad at her for  being on phone too much, 7 hours at times. Mood status: stable Satisfied with current treatment?: yes Symptom severity: mild  Duration of current treatment : chronic Side effects: no Medication compliance: excellent compliance Psychotherapy/counseling: yes current Depressed mood: only with sister Anxious mood: only with sister Anhedonia: no Significant weight loss or gain: no Insomnia: no  Fatigue: no Feelings of worthlessness or guilt: no Impaired concentration/indecisiveness: yes Suicidal ideations: no Hopelessness: no Crying spells: no    07/09/2024    8:48 AM 01/26/2024    9:32 AM 01/05/2024    9:02 AM 12/09/2023    9:35 AM 09/29/2023    9:23 AM  Depression screen PHQ 2/9  Decreased Interest 0  0 0   Down, Depressed, Hopeless 1  0 0   PHQ - 2 Score 1  0 0   Altered sleeping 0  0    Tired, decreased energy 0  0    Change in appetite 0  0    Feeling bad or failure about yourself  0  0    Trouble concentrating 0  0    Moving slowly or fidgety/restless 0  0    Suicidal thoughts 0  0    PHQ-9 Score 1  0    Difficult doing work/chores Not difficult at all  Not difficult at all       Information is confidential and restricted. Go to Review Flowsheets to unlock data.       07/09/2024    8:49 AM 01/05/2024    9:02 AM 09/29/2023  9:23 AM 07/02/2023    8:45 AM  GAD 7 : Generalized Anxiety Score  Nervous, Anxious, on Edge 0 0  0  Control/stop worrying 0 0  0  Worry too much - different things 1 1  0  Trouble relaxing 0 0  0  Restless 0 0  0  Easily annoyed or irritable 1 0  0  Afraid - awful might happen 0 0  0  Total GAD 7 Score 2 1  0  Anxiety Difficulty Not difficult at all Not difficult at all  Not difficult at all     Information is confidential and restricted. Go to Review Flowsheets to unlock data.    Relevant past medical, surgical, family and social history reviewed and updated as indicated. Interim medical history since our last visit reviewed. Allergies and  medications reviewed and updated.  Review of Systems  Constitutional:  Negative for activity change, appetite change, diaphoresis, fatigue and fever.  Respiratory:  Negative for cough, chest tightness and shortness of breath.   Cardiovascular:  Negative for chest pain, palpitations and leg swelling.  Gastrointestinal: Negative.   Endocrine: Negative for polydipsia, polyphagia and polyuria.  Neurological: Negative.  Negative for dizziness and headaches.  Psychiatric/Behavioral: Negative.      Per HPI unless specifically indicated above     Objective:    BP 108/70   Pulse 69   Temp 97.8 F (36.6 C) (Oral)   Ht 4' 11.8 (1.519 m)   Wt 230 lb 9.6 oz (104.6 kg)   LMP  (LMP Unknown)   SpO2 98%   BMI 45.34 kg/m   Wt Readings from Last 3 Encounters:  07/09/24 230 lb 9.6 oz (104.6 kg)  01/05/24 230 lb 6.4 oz (104.5 kg)  12/09/23 230 lb (104.3 kg)    Physical Exam Vitals and nursing note reviewed.  Constitutional:      General: She is awake. She is not in acute distress.    Appearance: Normal appearance. She is well-developed. She is obese. She is not ill-appearing or toxic-appearing.  HENT:     Head: Normocephalic and atraumatic.     Right Ear: Hearing normal.     Left Ear: Hearing normal.     Mouth/Throat:     Mouth: Mucous membranes are moist.  Eyes:     General: Lids are normal.        Right eye: No discharge.        Left eye: No discharge.     Extraocular Movements: Extraocular movements intact.     Conjunctiva/sclera: Conjunctivae normal.     Visual Fields: Right eye visual fields normal and left eye visual fields normal.  Neck:     Thyroid : No thyromegaly.     Vascular: No carotid bruit.     Trachea: Trachea normal.  Cardiovascular:     Rate and Rhythm: Normal rate and regular rhythm.     Pulses: Normal pulses.     Heart sounds: Normal heart sounds. No murmur heard.    No gallop.  Pulmonary:     Effort: Pulmonary effort is normal. No accessory muscle usage or  respiratory distress.     Breath sounds: Normal breath sounds.  Abdominal:     General: Bowel sounds are normal.     Palpations: Abdomen is soft.     Tenderness: There is no abdominal tenderness.  Musculoskeletal:     Cervical back: Normal range of motion and neck supple.     Right lower leg: No edema.  Left lower leg: No edema.  Lymphadenopathy:     Head:     Right side of head: No submental, submandibular, tonsillar, preauricular or posterior auricular adenopathy.     Left side of head: No submental, submandibular, tonsillar, preauricular or posterior auricular adenopathy.     Cervical: No cervical adenopathy.  Skin:    General: Skin is warm and dry.     Capillary Refill: Capillary refill takes less than 2 seconds.  Neurological:     Mental Status: She is alert and oriented to person, place, and time.     Gait: Gait is intact.     Deep Tendon Reflexes: Reflexes are normal and symmetric.     Reflex Scores:      Brachioradialis reflexes are 2+ on the right side and 2+ on the left side.      Patellar reflexes are 2+ on the right side and 2+ on the left side. Psychiatric:        Attention and Perception: Attention normal.        Mood and Affect: Mood normal.        Speech: Speech normal.        Behavior: Behavior normal. Behavior is cooperative.        Thought Content: Thought content normal.        Judgment: Judgment normal.    Results for orders placed or performed in visit on 01/05/24  Bayer DCA Hb A1c Waived   Collection Time: 01/05/24  9:04 AM  Result Value Ref Range   HB A1C (BAYER DCA - WAIVED) 5.8 (H) 4.8 - 5.6 %  Microalbumin, Urine Waived   Collection Time: 01/05/24  9:04 AM  Result Value Ref Range   Microalb, Ur Waived 80 (H) 0 - 19 mg/L   Creatinine, Urine Waived 300 10 - 300 mg/dL   Microalb/Creat Ratio <30 <30 mg/g  Comprehensive metabolic panel   Collection Time: 01/05/24  9:05 AM  Result Value Ref Range   Glucose 93 70 - 99 mg/dL   BUN 14 8 - 27 mg/dL    Creatinine, Ser 8.43 (H) 0.57 - 1.00 mg/dL   eGFR 37 (L) >40 fO/fpw/8.26   BUN/Creatinine Ratio 9 (L) 12 - 28   Sodium 140 134 - 144 mmol/L   Potassium 4.6 3.5 - 5.2 mmol/L   Chloride 104 96 - 106 mmol/L   CO2 22 20 - 29 mmol/L   Calcium  9.2 8.7 - 10.3 mg/dL   Total Protein 6.7 6.0 - 8.5 g/dL   Albumin 4.0 3.9 - 4.9 g/dL   Globulin, Total 2.7 1.5 - 4.5 g/dL   Bilirubin Total <9.7 0.0 - 1.2 mg/dL   Alkaline Phosphatase 118 44 - 121 IU/L   AST 24 0 - 40 IU/L   ALT 23 0 - 32 IU/L  CBC with Differential/Platelet   Collection Time: 01/05/24  9:05 AM  Result Value Ref Range   WBC 8.4 3.4 - 10.8 x10E3/uL   RBC 4.29 3.77 - 5.28 x10E6/uL   Hemoglobin 12.3 11.1 - 15.9 g/dL   Hematocrit 60.9 65.9 - 46.6 %   MCV 91 79 - 97 fL   MCH 28.7 26.6 - 33.0 pg   MCHC 31.5 31.5 - 35.7 g/dL   RDW 86.1 88.2 - 84.5 %   Platelets 244 150 - 450 x10E3/uL   Neutrophils 61 Not Estab. %   Lymphs 29 Not Estab. %   Monocytes 5 Not Estab. %   Eos 4 Not Estab. %  Basos 1 Not Estab. %   Neutrophils Absolute 5.2 1.4 - 7.0 x10E3/uL   Lymphocytes Absolute 2.4 0.7 - 3.1 x10E3/uL   Monocytes Absolute 0.4 0.1 - 0.9 x10E3/uL   EOS (ABSOLUTE) 0.3 0.0 - 0.4 x10E3/uL   Basophils Absolute 0.0 0.0 - 0.2 x10E3/uL   Immature Granulocytes 0 Not Estab. %   Immature Grans (Abs) 0.0 0.0 - 0.1 x10E3/uL  Lipid Panel w/o Chol/HDL Ratio   Collection Time: 01/05/24  9:05 AM  Result Value Ref Range   Cholesterol, Total 147 100 - 199 mg/dL   Triglycerides 795 (H) 0 - 149 mg/dL   HDL 50 >60 mg/dL   VLDL Cholesterol Cal 33 5 - 40 mg/dL   LDL Chol Calc (NIH) 64 0 - 99 mg/dL  TSH   Collection Time: 01/05/24  9:05 AM  Result Value Ref Range   TSH 1.950 0.450 - 4.500 uIU/mL  VITAMIN D  25 Hydroxy (Vit-D Deficiency, Fractures)   Collection Time: 01/05/24  9:05 AM  Result Value Ref Range   Vit D, 25-Hydroxy 34.8 30.0 - 100.0 ng/mL  Magnesium    Collection Time: 01/05/24  9:05 AM  Result Value Ref Range   Magnesium  2.0 1.6 -  2.3 mg/dL      Assessment & Plan:   Problem List Items Addressed This Visit       Cardiovascular and Mediastinum   Essential hypertension   Chronic, stable. BP at goal in office today.  Continue current medication regimen and collaboration with nephrology.  Lisinopril  for kidney protection, refills sent.  Recommend checking BP at home on occasion and documenting.  Labs today: A1c.  Return in 6 months.          Endocrine   Primary hyperparathyroidism [facility])   Ongoing per nephrology notes.  Continue collaboration with nephrology, recent note reviewed.  Labs up to date.      IFG (impaired fasting glucose)   Recommend continued diet focus, cutting back on soda and drinking more water + weight loss.  A1c trend up slightly to 5.9% today.  No current medications, start as needed.  Urine ALB 80 February 2025.      Relevant Orders   Bayer DCA Hb A1c Waived     Genitourinary   Chronic kidney disease, stage 3 (HCC)   Chronic, stable.  Continue collaboration with nephrology and Lisinopril  for kidney protection, recent notes reviewed.  Labs: up to date with nephrology.  Urine ALB 80 February 2025.        Other   Schizoaffective disorder, bipolar type (HCC) - Primary   Chronic, stable, followed by psychiatry.  Continue current medication regimen as prescribed by them, recent note reviewed.  Denies SI/HI.       Morbid obesity (HCC)   BMI 45.34 with HTN, CKD.  Recommended eating smaller high protein, low fat meals more frequently and exercising 30 mins a day 5 times a week with a goal of 10-15lb weight loss in the next 3 months. Patient voiced their understanding and motivation to adhere to these recommendations.       Mild intellectual disability   Ongoing, continue to monitor mood.  Support person present at all visits.      Hyperlipidemia   Chronic, ongoing.  Continue current medication regimen and adjust as needed.  Lipid panel today.      Relevant Orders   Lipid Panel w/o  Chol/HDL Ratio   Other Visit Diagnoses       Pneumococcal vaccination given  PCV20 in office today, educated on this.   Relevant Orders   Pneumococcal conjugate vaccine 20-valent (Completed)        Follow up plan: Return in about 6 months (around 01/09/2025) for Annual Physical after 01/04/25.

## 2024-07-09 NOTE — Assessment & Plan Note (Signed)
Chronic, stable, followed by psychiatry.  Continue current medication regimen as prescribed by them, recent note reviewed.  Denies SI/HI. 

## 2024-07-09 NOTE — Assessment & Plan Note (Signed)
 Chronic, stable.  Continue collaboration with nephrology and Lisinopril  for kidney protection, recent notes reviewed.  Labs: up to date with nephrology.  Urine ALB 80 February 2025.

## 2024-07-09 NOTE — Assessment & Plan Note (Signed)
 Chronic, ongoing.  Continue current medication regimen and adjust as needed. Lipid panel today.

## 2024-07-09 NOTE — Assessment & Plan Note (Signed)
 Ongoing, continue to monitor mood.  Support person present at all visits.

## 2024-07-09 NOTE — Assessment & Plan Note (Signed)
 BMI 45.34 with HTN, CKD.  Recommended eating smaller high protein, low fat meals more frequently and exercising 30 mins a day 5 times a week with a goal of 10-15lb weight loss in the next 3 months. Patient voiced their understanding and motivation to adhere to these recommendations.

## 2024-07-09 NOTE — Assessment & Plan Note (Signed)
 Ongoing per nephrology notes.  Continue collaboration with nephrology, recent note reviewed.  Labs up to date.

## 2024-07-09 NOTE — Assessment & Plan Note (Signed)
 Recommend continued diet focus, cutting back on soda and drinking more water + weight loss.  A1c trend up slightly to 5.9% today.  No current medications, start as needed.  Urine ALB 80 February 2025.

## 2024-07-09 NOTE — Assessment & Plan Note (Addendum)
 Chronic, stable. BP at goal in office today.  Continue current medication regimen and collaboration with nephrology.  Lisinopril  for kidney protection, refills sent.  Recommend checking BP at home on occasion and documenting.  Labs today: A1c.  Return in 6 months.

## 2024-07-10 ENCOUNTER — Ambulatory Visit: Payer: Self-pay | Admitting: Nurse Practitioner

## 2024-07-10 LAB — LIPID PANEL W/O CHOL/HDL RATIO
Cholesterol, Total: 155 mg/dL (ref 100–199)
HDL: 50 mg/dL (ref >39)
LDL Chol Calc (NIH): 72 mg/dL (ref 0–99)
Triglycerides: 198 mg/dL — ABNORMAL HIGH (ref 0–149)
VLDL Cholesterol Cal: 33 mg/dL (ref 5–40)

## 2024-07-10 NOTE — Progress Notes (Signed)
 Contacted via MyChart  Good morning Rita [name], your labs have returned and lipid panel overall remains stable.  Triglycerides a little elevated, continue Rosuvastatin  and working on healthy diet changes + reducing soda and exercising more.  Take some time away from phone and head out for some walks.  Any questions? Keep being stellar!!  Thank you for allowing me to participate in your care.  I appreciate you. Kindest regards, Kohle Winner

## 2024-08-05 ENCOUNTER — Encounter: Payer: Self-pay | Admitting: Psychiatry

## 2024-08-05 ENCOUNTER — Ambulatory Visit (INDEPENDENT_AMBULATORY_CARE_PROVIDER_SITE_OTHER): Admitting: Psychiatry

## 2024-08-05 VITALS — BP 126/80 | HR 91 | Temp 98.6°F | Ht 59.8 in | Wt 229.2 lb

## 2024-08-05 DIAGNOSIS — F250 Schizoaffective disorder, bipolar type: Secondary | ICD-10-CM

## 2024-08-05 DIAGNOSIS — F70 Mild intellectual disabilities: Secondary | ICD-10-CM | POA: Diagnosis not present

## 2024-08-05 DIAGNOSIS — G4709 Other insomnia: Secondary | ICD-10-CM

## 2024-08-05 NOTE — Progress Notes (Addendum)
 BH MD OP Progress Note  08/05/2024 9:32 AM Rita [name]  MRN:  [id number]  Chief Complaint:  Chief Complaint  Patient presents with   Follow-up   Insomnia   schizoaffective disorder   Medication Refill   Discussed the use of AI scribe software for clinical note transcription with the patient, who gave verbal consent to proceed.  History of Present Illness Rita [name] is a 63 year old Caucasian female, single, on SSI, has a history of schizoaffective disorder, intellectual disability,mild, chronic kidney disease stage IIIb, hypertension, secondary hyperparathyroidism, migraine headaches, lives in an adult family home in [place], presents for a follow-up.  Presents along with caretaker Ms. Sharlet Bowers.  She reports that her mood has remained stable and she feels she is managing well. She denies experiencing depression or anxiety, though she notes some anxiety related to taking tests. According to her caregiver, she can become upset in certain situations, such as when interacting with her sister, which sometimes leads her to write her sister negative letters. Both she and her caregiver state that she has been doing better overall, and others have also reported improvements.  Her caregiver reports that she previously spent excessive time on her phone, prompting the use of time restrictions through a parental control app. She has adjusted to these restrictions and demonstrates improvement with reduced phone use.   She describes occasional sleep disruption, sometimes waking up in the middle of the night. When this occurs, she listens to wave sounds to help herself relax.    She and her caregiver mention that she sometimes grinds her teeth during the day, which her caregiver believes may be related to stress or habit. She notes that she uses a mouth guard she purchased from a store, but ongoing dental issues have limited the effectiveness of this intervention.  She attends school and recently  transitioned to a new runner, broadcasting/film/video. She enjoys reading .She spends time with her sister and family, including trips to [facility] and sitting on the porch. She looks forward to a trip in October with her sister and to Christmas.  She did not express any suicidality, homicidality or perceptual disturbances.  She does have a history of chronic kidney disease and continues to be in the care of nephrology, according to caregiver she is currently stable according to nephrology.   \Visit Diagnosis:    ICD-10-CM   1. Schizoaffective disorder, bipolar type (HCC)  F25.0     2. Other insomnia  G47.09    Likely due to lack of sleep hygiene    3. Mild intellectual disability  F70    IQ 63      Past Psychiatric History: I have reviewed past psychiatric history from progress note on 06/30/2023.  Past trials of medications like Abilify, Seroquel , trazodone , Cymbalta , Wellbutrin, Topamax, chlorpromazine, Klonopin .  Past Medical History: Patient with history of seizure-like spells, later diagnosed with factitious disorder. Past Medical History:  Diagnosis Date   Adenomatous polyps    Allergy    Should not take Topamax or Ibuprofen because of  chronic kidney disease   Anxiety    Cataract    Cataract on one eye, not needing surgery yet   Chronic constipation    Chronic tension headaches    CKD (chronic kidney disease) stage 3, GFR 30-59 ml/min (HCC)    Depression    Factitious disorder    GERD (gastroesophageal reflux disease)    Hyperlipidemia    Hypertension    Intellectual disability due to developmental disorder,  unspecified    Mental retardation    Microscopic hematuria    Migraine    Obesity     Past Surgical History:  Procedure Laterality Date   CARPAL TUNNEL RELEASE     COLONOSCOPY WITH PROPOFOL  N/A 06/27/2017   Procedure: COLONOSCOPY WITH PROPOFOL ;  Surgeon: Viktoria Lamar DASEN, MD;  Location: [facility] ENDOSCOPY;  Service: Endoscopy;  Laterality: N/A;   COLONOSCOPY WITH PROPOFOL  N/A  07/23/2022   Procedure: COLONOSCOPY WITH PROPOFOL ;  Surgeon: Therisa Bi, MD;  Location: [facility] ENDOSCOPY;  Service: Gastroenterology;  Laterality: N/A;   CYSTOSCOPY W/ RETROGRADES Bilateral 11/15/2015   Procedure: CYSTOSCOPY WITH RETROGRADE PYELOGRAM;  Surgeon: Rosina Riis, MD;  Location: [facility] ORS;  Service: Urology;  Laterality: Bilateral;   US  ECHOCARDIOGRAPHY  2012   showed mild heart failure (LVEF 52%); small pericardial effusion    Family Psychiatric History: I have reviewed family psychiatric history from progress note on 06/30/2023.  Family History:  Family History  Problem Relation Age of Onset   COPD Mother    Other Mother 33       old age   Dementia Mother    Heart disease Father    Hyperlipidemia Father    Kidney disease Father    Cancer Father    Hypertension Father    Obesity Father    Hyperlipidemia Sister    Hypertension Sister    Kidney disease Sister    Breast cancer Maternal Aunt    Stroke Maternal Grandmother    Heart disease Maternal Grandfather    Cancer Paternal Grandmother    Hyperlipidemia Paternal Grandmother    Hypertension Paternal Grandmother    Obesity Paternal Grandmother    Vision loss Paternal Grandmother    Alcohol abuse Paternal Grandfather    Intellectual disability Maternal Aunt    Learning disabilities Maternal Aunt    Prostate cancer Neg Hx     Social History: I have reviewed social history from progress note on 06/30/2023. Social History   Socioeconomic History   Marital status: Single    Spouse name: Not on file   Number of children: Not on file   Years of education: Not on file   Highest education level: 12th grade  Occupational History   Occupation: consulting civil engineer   Occupation: disability  Tobacco Use   Smoking status: Never   Smokeless tobacco: Never  Vaping Use   Vaping status: Never Used  Substance and Sexual Activity   Alcohol use: No   Drug use: No   Sexual activity: Never  Other Topics Concern   Not on file   Social History Narrative   Goes to [employer]   Social Drivers of Health   Financial Resource Strain: Low Risk  (01/01/2024)   Overall Financial Resource Strain (CARDIA)    Difficulty of Paying Living Expenses: Not hard at all  Food Insecurity: No Food Insecurity (01/01/2024)   Hunger Vital Sign    Worried About Running Out of Food in the Last Year: Never true    Ran Out of Food in the Last Year: Never true  Transportation Needs: No Transportation Needs (01/01/2024)   PRAPARE - Administrator, Civil Service (Medical): No    Lack of Transportation (Non-Medical): No  Physical Activity: Insufficiently Active (01/01/2024)   Exercise Vital Sign    Days of Exercise per Week: 2 days    Minutes of Exercise per Session: 10 min  Stress: No Stress Concern Present (01/01/2024)   [employer] of Occupational Health - Occupational Stress Questionnaire  Feeling of Stress : Only a little  Social Connections: Moderately Isolated (01/01/2024)   Social Connection and Isolation Panel    Frequency of Communication with Friends and Family: More than three times a week    Frequency of Social Gatherings with Friends and Family: More than three times a week    Attends Religious Services: More than 4 times per year    Active Member of [employer] or Organizations: No    Attends Banker Meetings: Never    Marital Status: Never married    Allergies:  Allergies  Allergen Reactions   Advil [Ibuprofen] Other (See Comments)    Reaction:  Gives her kidney problems     Metabolic Disorder Labs: Lab Results  Component Value Date   HGBA1C 5.9 (H) 07/09/2024   Lab Results  Component Value Date   PROLACTIN 12.0 07/02/2023   Lab Results  Component Value Date   CHOL 155 07/09/2024   TRIG 198 (H) 07/09/2024   HDL 50 07/09/2024   CHOLHDL 4.5 (H) 07/14/2018   VLDL 62 (H) 04/25/2020   LDLCALC 72 07/09/2024   LDLCALC 64 01/05/2024   Lab Results  Component Value Date   TSH 1.950  01/05/2024   TSH 1.650 01/02/2023    Therapeutic Level Labs: No results found for: LITHIUM No results found for: VALPROATE No results found for: CBMZ  Current Medications: Current Outpatient Medications  Medication Sig Dispense Refill   Acetaminophen  Extra Strength 500 MG TABS TAKE (2) TABLETS BY MOUTH EVERY SIX HOURS AS NEEDED. 240 tablet 4   aspirin  81 MG chewable tablet Chew 1 tablet (81 mg total) by mouth daily. 90 tablet 4   carvedilol (COREG) 6.25 MG tablet Take 6.25 mg by mouth 2 (two) times daily.     Cholecalciferol  (GNP VITAMIN D3 EXTRA STRENGTH) 25 MCG (1000 UT) tablet TAKE TWO TABLETS BY MOUTH ONCE DAILY 180 tablet 1   clonazePAM  (KLONOPIN ) 0.5 MG tablet Take 1 tablet (0.5 mg total) by mouth 2 (two) times daily. 60 tablet 1   diclofenac  Sodium (VOLTAREN ) 1 % GEL Apply 2 g topically 3 (three) times daily. 100 g 4   docusate sodium  (COLACE) 100 MG capsule TAKE (1) CAPSULE BY MOUTH TWICE DAILY AS NEEDED. 180 capsule 4   DULoxetine  (CYMBALTA ) 30 MG capsule TAKE ONE CAPSULE BY MOUTH TWICE DAILY 60 capsule 5   GNP COUGH DM ER 30 MG/5ML liquid GIVE 2.5MLS BY MOUTH AS NEEDED FOR COUGH. 30 mL 0   icosapent  Ethyl (VASCEPA ) 1 g capsule Take 2 capsules (2 g total) by mouth 2 (two) times daily. 360 capsule 4   lisinopril  (ZESTRIL ) 5 MG tablet Take 1 tablet (5 mg total) by mouth daily. 90 tablet 4   magnesium  oxide (MAG-OX) 400 (240 Mg) MG tablet Take 1 tablet (400 mg total) by mouth daily. 90 tablet 4   Multiple Vitamin (TAB-A-VITE) TABS TAKE ONE TABLET BY MOUTH EVERY DAY 30 tablet 12   omeprazole  (PRILOSEC) 20 MG capsule TAKE (1) CAPSULE BY MOUTH ONCE DAILY. 90 capsule 4   QUEtiapine  (SEROQUEL ) 100 MG tablet Take 1 tablet (100 mg total) by mouth at bedtime. 30 tablet 5   rosuvastatin  (CRESTOR ) 40 MG tablet Take 1 tablet (40 mg total) by mouth daily. for cholesterol 90 tablet 4   sodium fluoride (FLUORISHIELD) 1.1 % GEL dental gel Take by mouth.     SUMAtriptan  (IMITREX ) 100 MG  tablet Take 1 tablet (100 mg total) by mouth every 2 (two) hours as needed for  migraine. 10 tablet 0   traZODone  (DESYREL ) 150 MG tablet Take 1 tablet (150 mg total) by mouth at bedtime. 30 tablet 5   No current facility-administered medications for this visit.     Musculoskeletal: Strength & Muscle Tone: within normal limits Gait & Station: normal Patient leans: N/A  Psychiatric Specialty Exam: Review of Systems  Psychiatric/Behavioral: Negative.      Blood pressure 126/80, pulse 91, temperature 98.6 F (37 C), temperature source Temporal, height 4' 11.8 (1.519 m), weight 229 lb 3.2 oz (104 kg), SpO2 96%.Body mass index is 45.06 kg/m.  General Appearance: Casual  Eye Contact:  Fair  Speech:  Normal Rate  Volume:  Normal  Mood:  Euthymic  Affect:  Appropriate  Thought Process:  Goal Directed and Descriptions of Associations: Intact  Orientation:  Full (Time, Place, and Person)  Thought Content: Logical   Suicidal Thoughts:  No  Homicidal Thoughts:  No  Memory:  Immediate;   Fair Recent;   Fair Remote;   Limited  Judgement:  Fair  Insight:  Shallow  Psychomotor Activity:  Normal  Concentration:  Concentration: Fair and Attention Span: Fair  Recall:  [employer] of Knowledge: Fair  Language: Fair  Akathisia:  No  Handed:  Right  AIMS (if indicated): done  Assets:  Communication Skills Desire for Improvement Housing Social Support Transportation  ADL's:  Intact  Cognition: WNL  Sleep:  Fair   Screenings: Midwife Visit from 08/05/2024 in [facility] Office Visit from 03/29/2024 in [facility] Office Visit from 01/26/2024 in [facility] Office Visit from 09/29/2023 in [facility] Office Visit from 06/30/2023 in [facility]  AIMS Total Score 0 0 0 0  0   GAD-7    Flowsheet Row Office Visit from 07/09/2024 in [facility] Office Visit from 01/05/2024 in [facility] Office Visit from 09/29/2023 in [facility] Office Visit from 07/02/2023 in [facility] Office Visit from 06/30/2023 in [facility]  Total GAD-7 Score 2 1 1  0 0   PHQ2-9    Flowsheet Row Office Visit from 07/09/2024 in [facility] Office Visit from 01/26/2024 in [facility] Office Visit from 01/05/2024 in [facility] Clinical Support from 12/09/2023 in [facility] Office Visit from 09/29/2023 in [facility]  PHQ-2 Total Score 1 0 0 0 0  PHQ-9 Total Score 1 -- 0 -- --   Flowsheet Row Office Visit from 08/05/2024 in [facility] Office Visit from 03/29/2024 in [facility] Office Visit from 01/26/2024 in [facility]  C-SSRS RISK CATEGORY No Risk No Risk No Risk     Assessment and Plan: MAYLYN NARVAIZ is a 63 year old Caucasian female, presented for a follow-up appointment.  Discussed assessment and plan as noted below.  Schizoaffective disorder-stable Currently well-managed on current medication regimen. Continue Cymbalta  60 mg daily in divided dosage-reduced dosage. Continue Seroquel  100 mg at bedtime Continue Clonazepam  0.5 mg twice daily Reviewed [place] AWARxE  Insomnia-improving Does report interrupted sleep on and off likely multifactorial.  Patient to keep a log of her symptoms and let this provider know. Continue  Trazodone  150 mg at bedtime. Patient to work on sleep hygiene techniques  Intellectual disability mild-chronic-stable Patient has good support  system and currently lives in an adult family living home.  Sister is legal guardian. Collateral information obtained from caretaker who was present in session today. Patient to make use of continued support.  Follow-up Follow-up in clinic in 5 months or sooner if needed.  Consent: Patient/Guardian gives verbal consent for treatment and assignment of benefits for services provided during this visit. Patient/Guardian expressed understanding and agreed to proceed.   This note was generated in part or whole with voice recognition software. Voice recognition is usually quite accurate but there are transcription errors that can and very often do occur. I apologize for any typographical errors that were not detected and corrected.    Javonda Suh, MD 08/06/2024, 9:58 AM

## 2024-08-05 NOTE — Patient Instructions (Signed)
 Insomnia Insomnia is a sleep disorder that makes it difficult to fall asleep or stay asleep. Insomnia can cause fatigue, low energy, difficulty concentrating, mood swings, and poor performance at work or school. There are three different ways to classify insomnia: Difficulty falling asleep. Difficulty staying asleep. Waking up too early in the morning. Any type of insomnia can be long-term (chronic) or short-term (acute). Both are common. Short-term insomnia usually lasts for 3 months or less. Chronic insomnia occurs at least three times a week for longer than 3 months. What are the causes? Insomnia may be caused by another condition, situation, or substance, such as: Having certain mental health conditions, such as anxiety and depression. Using caffeine, alcohol , tobacco, or drugs. Having gastrointestinal conditions, such as gastroesophageal reflux disease (GERD). Having certain medical conditions. These include: Asthma. Alzheimer's disease. Stroke. Chronic pain. An overactive thyroid  gland (hyperthyroidism). Other sleep disorders, such as restless legs syndrome and sleep apnea. Menopause. Sometimes, the cause of insomnia may not be known. What increases the risk? Risk factors for insomnia include: Gender. Females are affected more often than males. Age. Insomnia is more common as people get older. Stress and certain medical and mental health conditions. Lack of exercise. Having an irregular work schedule. This may include working night shifts and traveling between different time zones. What are the signs or symptoms? If you have insomnia, the main symptom is having trouble falling asleep or having trouble staying asleep. This may lead to other symptoms, such as: Feeling tired or having low energy. Feeling nervous about going to sleep. Not feeling rested in the morning. Having trouble concentrating. Feeling irritable, anxious, or depressed. How is this diagnosed? This condition  may be diagnosed based on: Your symptoms and medical history. Your health care provider may ask about: Your sleep habits. Any medical conditions you have. Your mental health. A physical exam. How is this treated? Treatment for insomnia depends on the cause. Treatment may focus on treating an underlying condition that is causing the insomnia. Treatment may also include: Medicines to help you sleep. Counseling or therapy. Lifestyle adjustments to help you sleep better. Follow these instructions at home: Eating and drinking  Limit or avoid alcohol , caffeinated beverages, and products that contain nicotine and tobacco, especially close to bedtime. These can disrupt your sleep. Do not eat a large meal or eat spicy foods right before bedtime. This can lead to digestive discomfort that can make it hard for you to sleep. Sleep habits  Keep a sleep diary to help you and your health care provider figure out what could be causing your insomnia. Write down: When you sleep. When you wake up during the night. How well you sleep and how rested you feel the next day. Any side effects of medicines you are taking. What you eat and drink. Make your bedroom a dark, comfortable place where it is easy to fall asleep. Put up shades or blackout curtains to block light from outside. Use a white noise machine to block noise. Keep the temperature cool. Limit screen use before bedtime. This includes: Not watching TV. Not using your smartphone, tablet, or computer. Stick to a routine that includes going to bed and waking up at the same times every day and night. This can help you fall asleep faster. Consider making a quiet activity, such as reading, part of your nighttime routine. Try to avoid taking naps during the day so that you sleep better at night. Get out of bed if you are still awake after  15 minutes of trying to sleep. Keep the lights down, but try reading or doing a quiet activity. When you feel  sleepy, go back to bed. General instructions Take over-the-counter and prescription medicines only as told by your health care provider. Exercise regularly as told by your health care provider. However, avoid exercising in the hours right before bedtime. Use relaxation techniques to manage stress. Ask your health care provider to suggest some techniques that may work well for you. These may include: Breathing exercises. Routines to release muscle tension. Visualizing peaceful scenes. Make sure that you drive carefully. Do not drive if you feel very sleepy. Keep all follow-up visits. This is important. Contact a health care provider if: You are tired throughout the day. You have trouble in your daily routine due to sleepiness. You continue to have sleep problems, or your sleep problems get worse. Get help right away if: You have thoughts about hurting yourself or someone else. Get help right away if you feel like you may hurt yourself or others, or have thoughts about taking your own life. Go to your nearest emergency room or: Call 911. Call the National Suicide Prevention Lifeline at 2232757840 or 988. This is open 24 hours a day. Text the Crisis Text Line at 657-529-4371. Summary Insomnia is a sleep disorder that makes it difficult to fall asleep or stay asleep. Insomnia can be long-term (chronic) or short-term (acute). Treatment for insomnia depends on the cause. Treatment may focus on treating an underlying condition that is causing the insomnia. Keep a sleep diary to help you and your health care provider figure out what could be causing your insomnia. This information is not intended to replace advice given to you by your health care provider. Make sure you discuss any questions you have with your health care provider. Document Revised: 10/29/2021 Document Reviewed: 10/29/2021 Elsevier Patient Education  2024 [employer].

## 2024-08-09 ENCOUNTER — Other Ambulatory Visit: Payer: Self-pay | Admitting: Psychiatry

## 2024-08-09 DIAGNOSIS — G4700 Insomnia, unspecified: Secondary | ICD-10-CM

## 2024-08-09 DIAGNOSIS — F79 Unspecified intellectual disabilities: Secondary | ICD-10-CM

## 2024-08-09 DIAGNOSIS — F250 Schizoaffective disorder, bipolar type: Secondary | ICD-10-CM

## 2024-08-18 ENCOUNTER — Ambulatory Visit (INDEPENDENT_AMBULATORY_CARE_PROVIDER_SITE_OTHER)

## 2024-08-18 DIAGNOSIS — N1832 Chronic kidney disease, stage 3b: Secondary | ICD-10-CM

## 2024-08-18 DIAGNOSIS — Z23 Encounter for immunization: Secondary | ICD-10-CM

## 2024-08-18 NOTE — Progress Notes (Signed)
 Patient is in office today for a nurse visit for Immunization. Patient Injection was given in the  Left deltoid. Patient tolerated injection well.

## 2024-08-31 ENCOUNTER — Other Ambulatory Visit: Payer: Self-pay | Admitting: Nurse Practitioner

## 2024-08-31 DIAGNOSIS — Z1231 Encounter for screening mammogram for malignant neoplasm of breast: Secondary | ICD-10-CM

## 2024-09-01 ENCOUNTER — Ambulatory Visit (INDEPENDENT_AMBULATORY_CARE_PROVIDER_SITE_OTHER)

## 2024-09-01 DIAGNOSIS — Z23 Encounter for immunization: Secondary | ICD-10-CM

## 2024-09-01 NOTE — Progress Notes (Signed)
 Patient is in office today for a nurse visit for Immunization. Patient Injection was given in the  Left deltoid. Patient tolerated injection well.

## 2024-09-02 ENCOUNTER — Other Ambulatory Visit: Payer: Self-pay | Admitting: Nurse Practitioner

## 2024-09-03 NOTE — Telephone Encounter (Signed)
 Requested medication (s) are due for refill today: expired medication date  Requested medication (s) are on the active medication list: yes   Last refill:  01/02/23 #100 g 4 refills  Future visit scheduled: yes 01/12/25  Notes to clinic:  do you want to renew Rx ? Expired medication date     Requested Prescriptions  Pending Prescriptions Disp Refills   diclofenac  Sodium (VOLTAREN ) 1 % GEL [Pharmacy Med Name: diclofenac  1 % topical gel] 100 g 4    Sig: A TWO GRAMS THREE TIMES DAILY     Analgesics:  Topicals Failed - 09/03/2024  2:02 PM      Failed - Manual Review: Labs are only required if the patient has taken medication for more than 8 weeks.      Failed - Cr in normal range and within 360 days    Creatinine  Date Value Ref Range Status  12/28/2013 1.36 (H) 0.60 - 1.30 mg/dL Final   Creatinine, Ser  Date Value Ref Range Status  01/05/2024 1.56 (H) 0.57 - 1.00 mg/dL Final         Passed - PLT in normal range and within 360 days    Platelets  Date Value Ref Range Status  01/05/2024 244 150 - 450 x10E3/uL Final         Passed - HGB in normal range and within 360 days    Hemoglobin  Date Value Ref Range Status  01/05/2024 12.3 11.1 - 15.9 g/dL Final         Passed - HCT in normal range and within 360 days    Hematocrit  Date Value Ref Range Status  01/05/2024 39.0 34.0 - 46.6 % Final         Passed - eGFR is 30 or above and within 360 days    EGFR (African American)  Date Value Ref Range Status  12/28/2013 52 (L)  Final   GFR calc Af Amer  Date Value Ref Range Status  09/13/2020 43 (L) >59 mL/min/1.73 Final    Comment:    **In accordance with recommendations from the NKF-ASN Task force,**   [facility] is in the process of updating its eGFR calculation to the   2021 CKD-EPI creatinine equation that estimates kidney function   without a race variable.    EGFR (Non-African Amer.)  Date Value Ref Range Status  12/28/2013 45 (L)  Final    Comment:    eGFR values  <95mL/min/1.73 m2 may be an indication of chronic kidney disease (CKD). Calculated eGFR is useful in patients with stable renal function. The eGFR calculation will not be reliable in acutely ill patients when serum creatinine is changing rapidly. It is not useful in  patients on dialysis. The eGFR calculation may not be applicable to patients at the low and high extremes of body sizes, pregnant women, and vegetarians.    GFR calc non Af Amer  Date Value Ref Range Status  09/13/2020 38 (L) >59 mL/min/1.73 Final   eGFR  Date Value Ref Range Status  01/05/2024 37 (L) >59 mL/min/1.73 Final         Passed - Patient is not pregnant      Passed - Valid encounter within last 12 months    Recent Outpatient Visits           1 month ago Schizoaffective disorder, bipolar type (HCC)   [place] [facility], [place] T, NP   8 months ago Schizoaffective disorder, bipolar type (HCC)  [place] [facility], Melanie DASEN, NP

## 2024-09-08 ENCOUNTER — Other Ambulatory Visit: Payer: Self-pay | Admitting: Nurse Practitioner

## 2024-09-09 NOTE — Telephone Encounter (Signed)
 Requested Prescriptions  Pending Prescriptions Disp Refills   GNP VITAMIN D3 EXTRA STRENGTH 25 MCG (1000 UT) tablet [Pharmacy Med Name: cholecalciferol  (vitamin D3) 25 mcg (1,000 unit) tablet] 60 tablet 5    Sig: TAKE TWO TABLETS BY MOUTH ONCE DAILY     Endocrinology:  Vitamins - Vitamin D  Supplementation 2 Failed - 09/09/2024  3:38 PM      Failed - Manual Review: Route requests for 50,000 IU strength to the provider      Passed - Ca in normal range and within 360 days    Calcium   Date Value Ref Range Status  01/05/2024 9.2 8.7 - 10.3 mg/dL Final   Calcium , Total  Date Value Ref Range Status  12/28/2013 8.6 8.5 - 10.1 mg/dL Final         Passed - Vitamin D  in normal range and within 360 days    Vit D, 25-Hydroxy  Date Value Ref Range Status  01/05/2024 34.8 30.0 - 100.0 ng/mL Final    Comment:    Vitamin D  deficiency has been defined by the Institute of Medicine and an Endocrine Society practice guideline as a level of serum 25-OH vitamin D  less than 20 ng/mL (1,2). The Endocrine Society went on to further define vitamin D  insufficiency as a level between 21 and 29 ng/mL (2). 1. IOM (Institute of Medicine). 2010. Dietary reference    intakes for calcium  and D. [place]: The    [employer]. 2. Holick MF, Binkley Marion, Bischoff-Ferrari HA, et al.    Evaluation, treatment, and prevention of vitamin D     deficiency: an Endocrine Society clinical practice    guideline. JCEM. 2011 Jul; 96(7):1911-30.          Passed - Valid encounter within last 12 months    Recent Outpatient Visits           2 months ago Schizoaffective disorder, bipolar type (HCC)   [facility] Supreme, Dunning T, NP   8 months ago Schizoaffective disorder, bipolar type [facility])    [facility] Zena, Melanie DASEN, NP

## 2024-10-13 ENCOUNTER — Ambulatory Visit
Admission: RE | Admit: 2024-10-13 | Discharge: 2024-10-13 | Disposition: A | Discharge status: Home / Self Care | Source: Ambulatory Visit | Attending: Nurse Practitioner | Admitting: Nurse Practitioner

## 2024-10-13 DIAGNOSIS — Z1231 Encounter for screening mammogram for malignant neoplasm of breast: Secondary | ICD-10-CM | POA: Insufficient documentation

## 2024-10-15 ENCOUNTER — Ambulatory Visit: Payer: Self-pay | Admitting: Nurse Practitioner

## 2024-10-15 NOTE — Progress Notes (Signed)
 Contacted via MyChart   Normal mammogram, may repeat in one year:)

## 2024-11-23 ENCOUNTER — Other Ambulatory Visit: Payer: Self-pay | Admitting: Psychiatry

## 2024-11-23 DIAGNOSIS — F250 Schizoaffective disorder, bipolar type: Secondary | ICD-10-CM

## 2024-11-23 DIAGNOSIS — G4700 Insomnia, unspecified: Secondary | ICD-10-CM

## 2024-12-14 ENCOUNTER — Ambulatory Visit: Payer: Self-pay

## 2024-12-14 VITALS — Ht <= 58 in | Wt 234.0 lb

## 2024-12-14 DIAGNOSIS — Z0000 Encounter for general adult medical examination without abnormal findings: Secondary | ICD-10-CM | POA: Diagnosis not present

## 2024-12-14 NOTE — Patient Instructions (Signed)
 Rita [name],  Thank you for taking the time for your Medicare Wellness Visit. I appreciate your continued commitment to your health goals. Please review the care plan we discussed, and feel free to reach out if I can assist you further.  Please note that Annual Wellness Visits do not include a physical exam. Some assessments may be limited, especially if the visit was conducted virtually. If needed, we may recommend an in-person follow-up with your provider.  Ongoing Care Seeing your primary care provider every 3 to 6 months helps us  monitor your health and provide consistent, personalized care.   Referrals If a referral was made during today's visit and you haven't received any updates within two weeks, please contact the referred provider directly to check on the status.  Recommended Screenings:  Keep up the good work and try to exercise more consistently.  Health Maintenance  Topic Date Due   Medicare Annual Wellness Visit  12/08/2024   Pap with HPV screening  01/02/2025*   COVID-19 Vaccine (8 - Moderna risk 2025-26 season) 02/15/2025   Breast Cancer Screening  10/13/2025   Colon Cancer Screening  07/23/2029   DTaP/Tdap/Td vaccine (3 - Tdap) 03/27/2031   Pneumococcal Vaccine for age over 30  Completed   Flu Shot  Completed   Hepatitis C Screening  Completed   HIV Screening  Completed   Zoster (Shingles) Vaccine  Completed   Hepatitis B Vaccine  Aged Out   HPV Vaccine  Aged Out   Meningitis B Vaccine  Aged Out  *Topic was postponed. The date shown is not the original due date.       12/14/2024   10:01 AM  Advanced Directives  Does Patient Have a Medical Advance Directive? Yes  Type of Advance Directive Healthcare Power of Attorney  Does patient want to make changes to medical advance directive? No - Patient declined  Copy of Healthcare Power of Attorney in Chart? No - copy requested    Vision: Annual vision screenings are recommended for early detection of glaucoma,  cataracts, and diabetic retinopathy. These exams can also reveal signs of chronic conditions such as diabetes and high blood pressure.  Dental: Annual dental screenings help detect early signs of oral cancer, gum disease, and other conditions linked to overall health, including heart disease and diabetes.  Please see the attached documents for additional preventive care recommendations.

## 2024-12-14 NOTE — Progress Notes (Signed)
 "  Chief Complaint  Patient presents with   Medicare Wellness     Subjective:   Rita [name] is a 64 y.o. female who presents for a Medicare Annual Wellness Visit.  Visit info / Clinical Intake: Medicare Wellness Visit Type:: Subsequent Annual Wellness Visit Persons participating in visit and providing information:: patient & caregiver Rita [name], sister assisted with today's visit) Medicare Wellness Visit Mode:: Telephone If telephone:: video declined Since this visit was completed virtually, some vitals may be partially provided or unavailable. Missing vitals are due to the limitations of the virtual format.: Documented vitals are patient reported If Telephone or Video please confirm:: I connected with patient using audio/video enable telemedicine. I verified patient identity with two identifiers, discussed telehealth limitations, and patient agreed to proceed. Patient Location:: home Provider Location:: clinic office Interpreter Needed?: No Pre-visit prep was completed: yes AWV questionnaire completed by patient prior to visit?: no Living arrangements:: with family/others (lives with sister, Rita [name]) Patient's Overall Health Status Rating: good Typical amount of pain: none Does pain affect daily life?: no Are you currently prescribed opioids?: no  Dietary Habits and Nutritional Risks How many meals a day?: 3 Eats fruit and vegetables daily?: yes Most meals are obtained by: having others provide food Rita [name], sister prepares meals) In the last 2 weeks, have you had any of the following?: none Diabetic:: no  Functional Status Activities of Daily Living (to include ambulation/medication): Independent Ambulation: Independent with device- listed below Home Assistive Devices/Equipment: Eyeglasses Medication Administration: Dependent (sister manages medications) Is this a change from baseline?: Pre-admission baseline Home Management (perform basic housework or laundry):  Independent Manage your own finances?: (!) no (sister manages finances) Primary transportation is: family / friends Concerns about vision?: no *vision screening is required for WTM* Concerns about hearing?: no  Fall Screening Falls in the past year?: 0 Number of falls in past year: 0 Was there an injury with Fall?: 0 Fall Risk Category Calculator: 0 Patient Fall Risk Level: Low Fall Risk  Fall Risk Patient at Risk for Falls Due to: No Fall Risks Fall risk Follow up: Falls evaluation completed  Home and Transportation Safety: All rugs have non-skid backing?: yes All stairs or steps have railings?: yes Grab bars in the bathtub or shower?: yes Have non-skid surface in bathtub or shower?: (!) no Good home lighting?: yes Regular seat belt use?: yes Hospital stays in the last year:: no  Cognitive Assessment Difficulty concentrating, remembering, or making decisions? : yes (intellectual disability) Will 6CIT or Mini Cog be Completed: yes What year is it?: 0 points What month is it?: 0 points Give patient an address phrase to remember (5 components): [address] [place] About what time is it?: 0 points Count backwards from 20 to 1: 0 points Say the months of the year in reverse: 0 points Repeat the address phrase from earlier: 0 points 6 CIT Score: 0 points  Advance Directives (For Healthcare) Does Patient Have a Medical Advance Directive?: Yes Does patient want to make changes to medical advance directive?: No - Patient declined Type of Advance Directive: Healthcare Power of Attorney Copy of Healthcare Power of Attorney in Chart?: No - copy requested  Reviewed/Updated  Reviewed/Updated: Reviewed All (Medical, Surgical, Family, Medications, Allergies, Care Teams, Patient Goals)    Allergies (verified) Advil [ibuprofen]   Current Medications (verified) Outpatient Encounter Medications as of 12/14/2024  Medication Sig   Acetaminophen  Extra Strength 500 MG TABS TAKE (2)  TABLETS BY MOUTH EVERY SIX HOURS AS NEEDED.  aspirin  81 MG chewable tablet Chew 1 tablet (81 mg total) by mouth daily.   carvedilol (COREG) 6.25 MG tablet Take 6.25 mg by mouth 2 (two) times daily.   clonazePAM  (KLONOPIN ) 0.5 MG tablet Take 1 tablet (0.5 mg total) by mouth 2 (two) times daily. Please fill when due   diclofenac  Sodium (VOLTAREN ) 1 % GEL A TWO GRAMS THREE TIMES DAILY (Patient taking differently: PRN)   docusate sodium  (COLACE) 100 MG capsule TAKE (1) CAPSULE BY MOUTH TWICE DAILY AS NEEDED.   DULoxetine  (CYMBALTA ) 30 MG capsule TAKE ONE CAPSULE BY MOUTH TWICE DAILY   GNP COUGH DM ER 30 MG/5ML liquid GIVE 2.5MLS BY MOUTH AS NEEDED FOR COUGH.   GNP VITAMIN D3 EXTRA STRENGTH 25 MCG (1000 UT) tablet TAKE TWO TABLETS BY MOUTH ONCE DAILY   icosapent  Ethyl (VASCEPA ) 1 g capsule Take 2 capsules (2 g total) by mouth 2 (two) times daily.   lisinopril  (ZESTRIL ) 5 MG tablet Take 1 tablet (5 mg total) by mouth daily.   magnesium  oxide (MAG-OX) 400 (240 Mg) MG tablet Take 1 tablet (400 mg total) by mouth daily.   Multiple Vitamin (TAB-A-VITE) TABS TAKE ONE TABLET BY MOUTH EVERY DAY   omeprazole  (PRILOSEC) 20 MG capsule TAKE (1) CAPSULE BY MOUTH ONCE DAILY.   QUEtiapine  (SEROQUEL ) 100 MG tablet TAKE ONE TABLET BY MOUTH AT BEDTIME   rosuvastatin  (CRESTOR ) 40 MG tablet Take 1 tablet (40 mg total) by mouth daily. for cholesterol   sodium fluoride (FLUORISHIELD) 1.1 % GEL dental gel Take by mouth.   SUMAtriptan  (IMITREX ) 100 MG tablet Take 1 tablet (100 mg total) by mouth every 2 (two) hours as needed for migraine.   traZODone  (DESYREL ) 150 MG tablet TAKE ONE TABLET BY MOUTH AT BEDTIME   No facility-administered encounter medications on file as of 12/14/2024.    History: Past Medical History:  Diagnosis Date   Adenomatous polyps    Allergy    Should not take Topamax or Ibuprofen because of  chronic kidney disease   Anxiety    Cataract    Cataract on one eye, not needing surgery yet    Chronic constipation    Chronic tension headaches    CKD (chronic kidney disease) stage 3, GFR 30-59 ml/min (HCC)    Depression    Factitious disorder    GERD (gastroesophageal reflux disease)    Hyperlipidemia    Hypertension    Intellectual disability due to developmental disorder, unspecified    Mental retardation    Microscopic hematuria    Migraine    Obesity    Past Surgical History:  Procedure Laterality Date   CARPAL TUNNEL RELEASE     COLONOSCOPY WITH PROPOFOL  N/A 06/27/2017   Procedure: COLONOSCOPY WITH PROPOFOL ;  Surgeon: Viktoria Lamar DASEN, MD;  Location: [facility] ENDOSCOPY;  Service: Endoscopy;  Laterality: N/A;   COLONOSCOPY WITH PROPOFOL  N/A 07/23/2022   Procedure: COLONOSCOPY WITH PROPOFOL ;  Surgeon: Therisa Bi, MD;  Location: [facility] ENDOSCOPY;  Service: Gastroenterology;  Laterality: N/A;   CYSTOSCOPY W/ RETROGRADES Bilateral 11/15/2015   Procedure: CYSTOSCOPY WITH RETROGRADE PYELOGRAM;  Surgeon: Rosina Riis, MD;  Location: [facility] ORS;  Service: Urology;  Laterality: Bilateral;   US  ECHOCARDIOGRAPHY  2012   showed mild heart failure (LVEF 52%); small pericardial effusion   Family History  Problem Relation Age of Onset   COPD Mother    Other Mother 36       old age   Dementia Mother    Heart disease Father  Hyperlipidemia Father    Kidney disease Father    Cancer Father    Hypertension Father    Obesity Father    Hyperlipidemia Sister    Hypertension Sister    Kidney disease Sister    Breast cancer Maternal Aunt    Stroke Maternal Grandmother    Heart disease Maternal Grandfather    Cancer Paternal Grandmother    Hyperlipidemia Paternal Grandmother    Hypertension Paternal Grandmother    Obesity Paternal Grandmother    Vision loss Paternal Grandmother    Alcohol abuse Paternal Grandfather    Intellectual disability Maternal Aunt    Learning disabilities Maternal Aunt    Prostate cancer Neg Hx    Social History   Occupational History   Occupation:  consulting civil engineer   Occupation: disability  Tobacco Use   Smoking status: Never   Smokeless tobacco: Never  Vaping Use   Vaping status: Never Used  Substance and Sexual Activity   Alcohol use: No   Drug use: No   Sexual activity: Never   Tobacco Counseling Counseling given: Not Answered  SDOH Screenings   Food Insecurity: No Food Insecurity (12/14/2024)  Housing: Low Risk (12/14/2024)  Transportation Needs: No Transportation Needs (12/14/2024)  Utilities: Not At Risk (12/14/2024)  Alcohol Screen: Low Risk (12/09/2023)  Depression (PHQ2-9): Low Risk (12/14/2024)  Financial Resource Strain: Low Risk (01/01/2024)  Physical Activity: Insufficiently Active (12/14/2024)  Social Connections: Moderately Isolated (12/14/2024)  Stress: No Stress Concern Present (12/14/2024)  Tobacco Use: Low Risk (12/14/2024)  Health Literacy: Adequate Health Literacy (12/14/2024)   See flowsheets for full screening details  Depression Screen PHQ 2 & 9 Depression Scale- Over the past 2 weeks, how often have you been bothered by any of the following problems? Little interest or pleasure in doing things: 0 Feeling down, depressed, or hopeless (PHQ Adolescent also includes...irritable): 0 PHQ-2 Total Score: 0 Trouble falling or staying asleep, or sleeping too much: 0 Feeling tired or having little energy: 0 Poor appetite or overeating (PHQ Adolescent also includes...weight loss): 0 Feeling bad about yourself - or that you are a failure or have let yourself or your family down: 0 Trouble concentrating on things, such as reading the newspaper or watching television (PHQ Adolescent also includes...like school work): 0 Moving or speaking so slowly that other people could have noticed. Or the opposite - being so fidgety or restless that you have been moving around a lot more than usual: 0 Thoughts that you would be better off dead, or of hurting yourself in some way: 0 PHQ-9 Total Score: 0 If you checked off any problems, how  difficult have these problems made it for you to do your work, take care of things at home, or get along with other people?: Not difficult at all  Depression Treatment Depression Interventions/Treatment : EYV7-0 Score <4 Follow-up Not Indicated; Currently on Treatment     Goals Addressed             This Visit's Progress    Exercise 3x per week (30 min per time)   Not on track    Go to the [facility]             Objective:    Today's Vitals   12/14/24 0956  Weight: 234 lb (106.1 kg)  Height: 4' 9 (1.448 m)   Body mass index is 50.64 kg/m.  Hearing/Vision screen Hearing Screening - Comments:: Denies hearing loss  Vision Screening - Comments:: UTD w/ [facility] [place] Immunizations and  Health Maintenance Health Maintenance  Topic Date Due   Cervical Cancer Screening (HPV/Pap Cotest)  01/02/2025 (Originally 10/22/1991)   COVID-19 Vaccine (8 - Moderna risk 2025-26 season) 02/15/2025   Mammogram  10/13/2025   Medicare Annual Wellness (AWV)  12/14/2025   Colonoscopy  07/23/2029   DTaP/Tdap/Td (3 - Tdap) 03/27/2031   Pneumococcal Vaccine: 50+ Years  Completed   Influenza Vaccine  Completed   Hepatitis C Screening  Completed   HIV Screening  Completed   Zoster Vaccines- Shingrix  Completed   Hepatitis B Vaccines 19-59 Average Risk  Aged Out   HPV VACCINES  Aged Out   Meningococcal B Vaccine  Aged Out        Assessment/Plan:  This is a routine wellness examination for [place].  Patient Care Team: Cannady, Jolene T, NP as PCP - General (Nurse Practitioner) Lateef, Munsoor, MD (Nephrology) Eappen, Saramma, MD as Consulting Physician (Psychiatry) Pa, [facility])  I have personally reviewed and noted the following in the patients chart:   Medical and social history Use of alcohol, tobacco or illicit drugs  Current medications and supplements including opioid prescriptions. Functional ability and status Nutritional status Physical  activity Advanced directives List of other physicians Hospitalizations, surgeries, and ER visits in previous 12 months Vitals Screenings to include cognitive, depression, and falls Referrals and appointments  No orders of the defined types were placed in this encounter.  In addition, I have reviewed and discussed with patient certain preventive protocols, quality metrics, and best practice recommendations. A written personalized care plan for preventive services as well as general preventive health recommendations were provided to patient.   Vina Ned, CMA   12/14/2024   Return in 1 year (on 12/15/2025) for Medicare Annual Wellness Visit.  After Visit Summary: (MyChart) Due to this being a telephonic visit, the after visit summary with patients personalized plan was offered to patient via MyChart   Nurse Notes:  No voiced or noted concerns at this time  "

## 2024-12-29 ENCOUNTER — Other Ambulatory Visit: Payer: Self-pay | Admitting: Nurse Practitioner

## 2024-12-29 ENCOUNTER — Other Ambulatory Visit: Payer: Self-pay | Admitting: Psychiatry

## 2024-12-29 DIAGNOSIS — I10 Essential (primary) hypertension: Secondary | ICD-10-CM

## 2024-12-29 DIAGNOSIS — F250 Schizoaffective disorder, bipolar type: Secondary | ICD-10-CM

## 2024-12-29 DIAGNOSIS — F79 Unspecified intellectual disabilities: Secondary | ICD-10-CM

## 2024-12-29 DIAGNOSIS — G4700 Insomnia, unspecified: Secondary | ICD-10-CM

## 2024-12-30 NOTE — Telephone Encounter (Signed)
 Requested medications are due for refill today.  yes  Requested medications are on the active medications list.  yes  Last refill. 01/05/2024  Future visit scheduled.   yes  Notes to clinic.  Expired labs - or no protocol for these 3 meds.    Requested Prescriptions  Pending Prescriptions Disp Refills   icosapent  Ethyl (VASCEPA ) 1 g capsule [Pharmacy Med Name: Vascepa  1 gram capsule] 360 capsule 0    Sig: TAKE TWO CAPSULES BY MOUTH TWICE DAILY     Off-Protocol Failed - 12/30/2024 11:17 AM      Failed - Medication not assigned to a protocol, review manually.      Passed - Valid encounter within last 12 months    Recent Outpatient Visits           5 months ago Schizoaffective disorder, bipolar type (HCC)   [place] [facility] [place], [place] T, NP   12 months ago Schizoaffective disorder, bipolar type (HCC)   [place] [facility] [place], Melanie T, NP               Multiple Vitamin (TAB-A-VITE) TABS [Pharmacy Med Name: Tab-A-Vite 400 mcg tablet] 30 tablet 12    Sig: TAKE ONE TABLET BY MOUTH EVERY DAY     There is no refill protocol information for this order     lisinopril  (ZESTRIL ) 5 MG tablet [Pharmacy Med Name: lisinopril  5 mg tablet] 30 tablet 12    Sig: TAKE ONE TABLET BY MOUTH ONCE DAILY     Cardiovascular:  ACE Inhibitors Failed - 12/30/2024 11:17 AM      Failed - Cr in normal range and within 180 days    Creatinine  Date Value Ref Range Status  12/28/2013 1.36 (H) 0.60 - 1.30 mg/dL Final   Creatinine, Ser  Date Value Ref Range Status  01/05/2024 1.56 (H) 0.57 - 1.00 mg/dL Final         Failed - K in normal range and within 180 days    Potassium  Date Value Ref Range Status  01/05/2024 4.6 3.5 - 5.2 mmol/L Final  12/28/2013 3.8 3.5 - 5.1 mmol/L Final         Passed - Patient is not pregnant      Passed - Last BP in normal range    BP Readings from Last 1 Encounters:  07/09/24 108/70         Passed - Valid  encounter within last 6 months    Recent Outpatient Visits           5 months ago Schizoaffective disorder, bipolar type (HCC)   [place] [facility] [place], [place] T, NP   12 months ago Schizoaffective disorder, bipolar type (HCC)   [place] [facility] [place], Melanie T, NP              Signed Prescriptions Disp Refills   magnesium  oxide (MAG-OX) 400 (240 Mg) MG tablet 90 tablet 0    Sig: TAKE ONE TABLET BY MOUTH ONCE DAILY     Endocrinology:  Minerals - Magnesium  Supplementation Failed - 12/30/2024 11:17 AM      Failed - Cr in normal range and within 360 days    Creatinine  Date Value Ref Range Status  12/28/2013 1.36 (H) 0.60 - 1.30 mg/dL Final   Creatinine, Ser  Date Value Ref Range Status  01/05/2024 1.56 (H) 0.57 - 1.00 mg/dL Final         Passed -  Mg Level in normal range and within 360 days    Magnesium   Date Value Ref Range Status  01/05/2024 2.0 1.6 - 2.3 mg/dL Final  98/72/7984 1.7 (L) mg/dL Final    Comment:    8.1-7.5 THERAPEUTIC RANGE: 4-7 mg/dL TOXIC: > 10 mg/dL  -----------------------          Passed - Valid encounter within last 12 months    Recent Outpatient Visits           5 months ago Schizoaffective disorder, bipolar type (HCC)   [place] [facility] [place], Melanie T, NP   12 months ago Schizoaffective disorder, bipolar type (HCC)   [place] [facility] [place], Jolene T, NP               rosuvastatin  (CRESTOR ) 40 MG tablet 90 tablet 0    Sig: TAKE ONE TABLET BY MOUTH ONCE DAILY FOR CHOLESTEROL     Cardiovascular:  Antilipid - Statins 2 Failed - 12/30/2024 11:17 AM      Failed - Cr in normal range and within 360 days    Creatinine  Date Value Ref Range Status  12/28/2013 1.36 (H) 0.60 - 1.30 mg/dL Final   Creatinine, Ser  Date Value Ref Range Status  01/05/2024 1.56 (H) 0.57 - 1.00 mg/dL Final         Failed - Lipid Panel in normal range within the last 12  months    Cholesterol, Total  Date Value Ref Range Status  07/09/2024 155 100 - 199 mg/dL Final   Cholesterol  Date Value Ref Range Status  09/30/2012 189 0 - 200 mg/dL Final   Cholesterol Piccolo, Waived  Date Value Ref Range Status  04/25/2020 189 <200 mg/dL Final    Comment:                            Desirable                <200                         Borderline High      200- 239                         High                     >239    Ldl Cholesterol, Calc  Date Value Ref Range Status  09/30/2012 101 (H) 0 - 100 mg/dL Final   LDL Chol Calc (NIH)  Date Value Ref Range Status  07/09/2024 72 0 - 99 mg/dL Final   LDL Direct  Date Value Ref Range Status  07/14/2018 129 (H) 0 - 99 mg/dL Final   HDL Cholesterol  Date Value Ref Range Status  09/30/2012 61 (H) 40 - 60 mg/dL Final   HDL  Date Value Ref Range Status  07/09/2024 50 >39 mg/dL Final   Triglycerides  Date Value Ref Range Status  07/09/2024 198 (H) 0 - 149 mg/dL Final  89/69/7986 863 0 - 200 mg/dL Final   Triglycerides Piccolo,Waived  Date Value Ref Range Status  04/25/2020 310 (H) <150 mg/dL Final    Comment:                            Normal                   <  150                         Borderline High     150 - 199                         High                200 - 499                         Very High                >499          Passed - Patient is not pregnant      Passed - Valid encounter within last 12 months    Recent Outpatient Visits           5 months ago Schizoaffective disorder, bipolar type (HCC)   [place] [facility] [place], Jolene T, NP   12 months ago Schizoaffective disorder, bipolar type (HCC)   [place] [facility] [place], Jolene T, NP               omeprazole  (PRILOSEC) 20 MG capsule 90 capsule 0    Sig: TAKE ONE CAPSULE BY MOUTH ONCE DAILY     Gastroenterology: Proton Pump Inhibitors Passed - 12/30/2024 11:17 AM      Passed -  Valid encounter within last 12 months    Recent Outpatient Visits           5 months ago Schizoaffective disorder, bipolar type (HCC)   [place] [facility] [place], Jolene T, NP   12 months ago Schizoaffective disorder, bipolar type (HCC)   [place] [facility] [place], Jolene T, NP               aspirin  (ASPIRIN  LOW DOSE) 81 MG chewable tablet 90 tablet 0    Sig: CHEW ONE TABLET BY MOUTH ONCE DAILY     Analgesics:  NSAIDS - aspirin  Failed - 12/30/2024 11:17 AM      Failed - Cr in normal range and within 360 days    Creatinine  Date Value Ref Range Status  12/28/2013 1.36 (H) 0.60 - 1.30 mg/dL Final   Creatinine, Ser  Date Value Ref Range Status  01/05/2024 1.56 (H) 0.57 - 1.00 mg/dL Final         Passed - eGFR is 10 or above and within 360 days    EGFR (African American)  Date Value Ref Range Status  12/28/2013 52 (L)  Final   GFR calc Af Amer  Date Value Ref Range Status  09/13/2020 43 (L) >59 mL/min/1.73 Final    Comment:    **In accordance with recommendations from the NKF-ASN Task force,**   [facility] is in the process of updating its eGFR calculation to the   2021 CKD-EPI creatinine equation that estimates kidney function   without a race variable.    EGFR (Non-African Amer.)  Date Value Ref Range Status  12/28/2013 45 (L)  Final    Comment:    eGFR values <63mL/min/1.73 m2 may be an indication of chronic kidney disease (CKD). Calculated eGFR is useful in patients with stable renal function. The eGFR calculation will not be reliable in acutely ill patients when serum creatinine is changing rapidly. It is not useful in  patients on dialysis. The eGFR  calculation may not be applicable to patients at the low and high extremes of body sizes, pregnant women, and vegetarians.    GFR calc non Af Amer  Date Value Ref Range Status  09/13/2020 38 (L) >59 mL/min/1.73 Final   eGFR  Date Value Ref Range Status  01/05/2024 37 (L)  >59 mL/min/1.73 Final         Passed - Patient is not pregnant      Passed - Valid encounter within last 12 months    Recent Outpatient Visits           5 months ago Schizoaffective disorder, bipolar type (HCC)   [place] [facility] [place], Melanie T, NP   12 months ago Schizoaffective disorder, bipolar type [facility])   [place] [facility] [place], Melanie DASEN, NP

## 2024-12-30 NOTE — Telephone Encounter (Signed)
 Requested Prescriptions  Pending Prescriptions Disp Refills   magnesium  oxide (MAG-OX) 400 (240 Mg) MG tablet [Pharmacy Med Name: magnesium  oxide 400 mg (241.3 mg magnesium ) tablet] 90 tablet 0    Sig: TAKE ONE TABLET BY MOUTH ONCE DAILY     Endocrinology:  Minerals - Magnesium  Supplementation Failed - 12/30/2024 11:16 AM      Failed - Cr in normal range and within 360 days    Creatinine  Date Value Ref Range Status  12/28/2013 1.36 (H) 0.60 - 1.30 mg/dL Final   Creatinine, Ser  Date Value Ref Range Status  01/05/2024 1.56 (H) 0.57 - 1.00 mg/dL Final         Passed - Mg Level in normal range and within 360 days    Magnesium   Date Value Ref Range Status  01/05/2024 2.0 1.6 - 2.3 mg/dL Final  98/72/7984 1.7 (L) mg/dL Final    Comment:    8.1-7.5 THERAPEUTIC RANGE: 4-7 mg/dL TOXIC: > 10 mg/dL  -----------------------          Passed - Valid encounter within last 12 months    Recent Outpatient Visits           5 months ago Schizoaffective disorder, bipolar type (HCC)   [place] [facility] [place], [place] T, NP   12 months ago Schizoaffective disorder, bipolar type (HCC)   [place] [facility] [place], Jolene T, NP               rosuvastatin  (CRESTOR ) 40 MG tablet [Pharmacy Med Name: rosuvastatin  40 mg tablet] 90 tablet 0    Sig: TAKE ONE TABLET BY MOUTH ONCE DAILY FOR CHOLESTEROL     Cardiovascular:  Antilipid - Statins 2 Failed - 12/30/2024 11:16 AM      Failed - Cr in normal range and within 360 days    Creatinine  Date Value Ref Range Status  12/28/2013 1.36 (H) 0.60 - 1.30 mg/dL Final   Creatinine, Ser  Date Value Ref Range Status  01/05/2024 1.56 (H) 0.57 - 1.00 mg/dL Final         Failed - Lipid Panel in normal range within the last 12 months    Cholesterol, Total  Date Value Ref Range Status  07/09/2024 155 100 - 199 mg/dL Final   Cholesterol  Date Value Ref Range Status  09/30/2012 189 0 - 200 mg/dL Final    Cholesterol Piccolo, Waived  Date Value Ref Range Status  04/25/2020 189 <200 mg/dL Final    Comment:                            Desirable                <200                         Borderline High      200- 239                         High                     >239    Ldl Cholesterol, Calc  Date Value Ref Range Status  09/30/2012 101 (H) 0 - 100 mg/dL Final   LDL Chol Calc (NIH)  Date Value Ref Range Status  07/09/2024 72 0 - 99 mg/dL Final   LDL Direct  Date Value Ref Range Status  07/14/2018 129 (H) 0 - 99 mg/dL Final   HDL Cholesterol  Date Value Ref Range Status  09/30/2012 61 (H) 40 - 60 mg/dL Final   HDL  Date Value Ref Range Status  07/09/2024 50 >39 mg/dL Final   Triglycerides  Date Value Ref Range Status  07/09/2024 198 (H) 0 - 149 mg/dL Final  89/69/7986 863 0 - 200 mg/dL Final   Triglycerides Piccolo,Waived  Date Value Ref Range Status  04/25/2020 310 (H) <150 mg/dL Final    Comment:                            Normal                   <150                         Borderline High     150 - 199                         High                200 - 499                         Very High                >499          Passed - Patient is not pregnant      Passed - Valid encounter within last 12 months    Recent Outpatient Visits           5 months ago Schizoaffective disorder, bipolar type (HCC)   [place] [facility] [place], [place] T, NP   12 months ago Schizoaffective disorder, bipolar type (HCC)   [place] [facility] [place], Jolene T, NP               VASCEPA  1 g capsule [Pharmacy Med Name: Vascepa  1 gram capsule] 360 capsule 0    Sig: TAKE TWO CAPSULES BY MOUTH TWICE DAILY     Off-Protocol Failed - 12/30/2024 11:16 AM      Failed - Medication not assigned to a protocol, review manually.      Passed - Valid encounter within last 12 months    Recent Outpatient Visits           5 months ago  Schizoaffective disorder, bipolar type (HCC)   [place] [facility] [place], [place] T, NP   12 months ago Schizoaffective disorder, bipolar type (HCC)   [place] [facility] [place], Jolene T, NP               omeprazole  (PRILOSEC) 20 MG capsule [Pharmacy Med Name: omeprazole  20 mg capsule,delayed release] 90 capsule 0    Sig: TAKE ONE CAPSULE BY MOUTH ONCE DAILY     Gastroenterology: Proton Pump Inhibitors Passed - 12/30/2024 11:16 AM      Passed - Valid encounter within last 12 months    Recent Outpatient Visits           5 months ago Schizoaffective disorder, bipolar type (HCC)   [place] [facility] [place], Melanie T, NP   12 months ago Schizoaffective disorder, bipolar type [facility])    [facility] [place], [place] T,  NP               ASPIRIN  LOW DOSE 81 MG chewable tablet [Pharmacy Med Name: aspirin  81 mg chewable tablet] 90 tablet 0    Sig: CHEW ONE TABLET BY MOUTH ONCE DAILY     Analgesics:  NSAIDS - aspirin  Failed - 12/30/2024 11:16 AM      Failed - Cr in normal range and within 360 days    Creatinine  Date Value Ref Range Status  12/28/2013 1.36 (H) 0.60 - 1.30 mg/dL Final   Creatinine, Ser  Date Value Ref Range Status  01/05/2024 1.56 (H) 0.57 - 1.00 mg/dL Final         Passed - eGFR is 10 or above and within 360 days    EGFR (African American)  Date Value Ref Range Status  12/28/2013 52 (L)  Final   GFR calc Af Amer  Date Value Ref Range Status  09/13/2020 43 (L) >59 mL/min/1.73 Final    Comment:    **In accordance with recommendations from the NKF-ASN Task force,**   [facility] is in the process of updating its eGFR calculation to the   2021 CKD-EPI creatinine equation that estimates kidney function   without a race variable.    EGFR (Non-African Amer.)  Date Value Ref Range Status  12/28/2013 45 (L)  Final    Comment:    eGFR values <27mL/min/1.73 m2 may be an indication of  chronic kidney disease (CKD). Calculated eGFR is useful in patients with stable renal function. The eGFR calculation will not be reliable in acutely ill patients when serum creatinine is changing rapidly. It is not useful in  patients on dialysis. The eGFR calculation may not be applicable to patients at the low and high extremes of body sizes, pregnant women, and vegetarians.    GFR calc non Af Amer  Date Value Ref Range Status  09/13/2020 38 (L) >59 mL/min/1.73 Final   eGFR  Date Value Ref Range Status  01/05/2024 37 (L) >59 mL/min/1.73 Final         Passed - Patient is not pregnant      Passed - Valid encounter within last 12 months    Recent Outpatient Visits           5 months ago Schizoaffective disorder, bipolar type (HCC)   [place] [facility] [place], Jolene T, NP   12 months ago Schizoaffective disorder, bipolar type (HCC)   [place] [facility] [place], Jolene T, NP               Multiple Vitamin (TAB-A-VITE) TABS [Pharmacy Med Name: Tab-A-Vite 400 mcg tablet] 30 tablet 12    Sig: TAKE ONE TABLET BY MOUTH EVERY DAY     There is no refill protocol information for this order     lisinopril  (ZESTRIL ) 5 MG tablet [Pharmacy Med Name: lisinopril  5 mg tablet] 30 tablet 12    Sig: TAKE ONE TABLET BY MOUTH ONCE DAILY     Cardiovascular:  ACE Inhibitors Failed - 12/30/2024 11:16 AM      Failed - Cr in normal range and within 180 days    Creatinine  Date Value Ref Range Status  12/28/2013 1.36 (H) 0.60 - 1.30 mg/dL Final   Creatinine, Ser  Date Value Ref Range Status  01/05/2024 1.56 (H) 0.57 - 1.00 mg/dL Final         Failed - K in normal range and within 180 days    Potassium  Date Value Ref Range Status  01/05/2024 4.6 3.5 - 5.2 mmol/L Final  12/28/2013 3.8 3.5 - 5.1 mmol/L Final         Passed - Patient is not pregnant      Passed - Last BP in normal range    BP Readings from Last 1 Encounters:  07/09/24 108/70          Passed - Valid encounter within last 6 months    Recent Outpatient Visits           5 months ago Schizoaffective disorder, bipolar type (HCC)   [place] [facility] [place], Melanie T, NP   12 months ago Schizoaffective disorder, bipolar type [facility])   [place] [facility] [place], Melanie DASEN, NP

## 2025-01-05 ENCOUNTER — Ambulatory Visit: Admitting: Psychiatry

## 2025-01-10 ENCOUNTER — Ambulatory Visit: Admitting: Psychiatry

## 2025-01-12 ENCOUNTER — Encounter: Admitting: Nurse Practitioner

## 2025-12-15 ENCOUNTER — Ambulatory Visit
